# Patient Record
Sex: Male | Born: 1935 | Race: White | Hispanic: No | Marital: Married | Smoking: Former smoker
Health system: Southern US, Community
[De-identification: ages and names within clinical notes are randomized; demographics above are authoritative.]

## PROBLEM LIST (undated history)

## (undated) DIAGNOSIS — K635 Polyp of colon: Secondary | ICD-10-CM

## (undated) DIAGNOSIS — I340 Nonrheumatic mitral (valve) insufficiency: Secondary | ICD-10-CM

## (undated) DIAGNOSIS — Z7901 Long term (current) use of anticoagulants: Secondary | ICD-10-CM

## (undated) DIAGNOSIS — G6181 Chronic inflammatory demyelinating polyneuritis: Secondary | ICD-10-CM

## (undated) DIAGNOSIS — R2681 Unsteadiness on feet: Secondary | ICD-10-CM

## (undated) DIAGNOSIS — L0292 Furuncle, unspecified: Secondary | ICD-10-CM

## (undated) DIAGNOSIS — M8580 Other specified disorders of bone density and structure, unspecified site: Secondary | ICD-10-CM

## (undated) DIAGNOSIS — J309 Allergic rhinitis, unspecified: Secondary | ICD-10-CM

## (undated) DIAGNOSIS — Z8673 Personal history of transient ischemic attack (TIA), and cerebral infarction without residual deficits: Secondary | ICD-10-CM

## (undated) DIAGNOSIS — L0293 Carbuncle, unspecified: Secondary | ICD-10-CM

## (undated) DIAGNOSIS — D229 Melanocytic nevi, unspecified: Secondary | ICD-10-CM

## (undated) DIAGNOSIS — N200 Calculus of kidney: Secondary | ICD-10-CM

## (undated) DIAGNOSIS — I4891 Unspecified atrial fibrillation: Secondary | ICD-10-CM

## (undated) DIAGNOSIS — E785 Hyperlipidemia, unspecified: Secondary | ICD-10-CM

## (undated) HISTORY — DX: Personal history of transient ischemic attack (TIA), and cerebral infarction without residual deficits: Z86.73

## (undated) HISTORY — DX: Hyperlipidemia, unspecified: E78.5

## (undated) HISTORY — DX: Allergic rhinitis, unspecified: J30.9

## (undated) HISTORY — DX: Calculus of kidney: N20.0

## (undated) HISTORY — DX: Furuncle, unspecified: L02.92

## (undated) HISTORY — DX: Melanocytic nevi, unspecified: D22.9

## (undated) HISTORY — PX: APPENDECTOMY: SHX54

## (undated) HISTORY — DX: Chronic inflammatory demyelinating polyneuritis: G61.81

## (undated) HISTORY — DX: Unspecified atrial fibrillation: I48.91

## (undated) HISTORY — DX: Long term (current) use of anticoagulants: Z79.01

## (undated) HISTORY — DX: Carbuncle, unspecified: L02.93

## (undated) HISTORY — DX: Polyp of colon: K63.5

## (undated) HISTORY — DX: Other specified disorders of bone density and structure, unspecified site: M85.80

## (undated) HISTORY — DX: Nonrheumatic mitral (valve) insufficiency: I34.0

## (undated) HISTORY — DX: Other disorders of bilirubin metabolism: E80.6

## (undated) HISTORY — DX: Unsteadiness on feet: R26.81

---

## 2008-08-09 ENCOUNTER — Emergency Department (HOSPITAL_COMMUNITY): Admission: EM | Admit: 2008-08-09 | Discharge: 2008-08-09 | Payer: Self-pay | Admitting: Emergency Medicine

## 2009-02-17 ENCOUNTER — Encounter (INDEPENDENT_AMBULATORY_CARE_PROVIDER_SITE_OTHER): Payer: Self-pay | Admitting: *Deleted

## 2009-02-25 ENCOUNTER — Encounter (INDEPENDENT_AMBULATORY_CARE_PROVIDER_SITE_OTHER): Payer: Self-pay | Admitting: *Deleted

## 2009-03-01 ENCOUNTER — Ambulatory Visit: Payer: Self-pay | Admitting: Gastroenterology

## 2009-04-05 ENCOUNTER — Ambulatory Visit: Payer: Self-pay | Admitting: Gastroenterology

## 2009-09-14 ENCOUNTER — Encounter: Payer: Self-pay | Admitting: Gastroenterology

## 2009-11-22 ENCOUNTER — Telehealth: Payer: Self-pay | Admitting: Gastroenterology

## 2009-12-30 ENCOUNTER — Ambulatory Visit: Payer: Self-pay | Admitting: Gastroenterology

## 2010-04-11 NOTE — Progress Notes (Signed)
Summary: Triage  Phone Note Call from Patient Call back at Home Phone [phone]   Caller: Patient Call For: Dr. Christella Hartigan Reason for Call: Talk to Nurse Summary of Call: pt. is requesting to speak directly to nurse about his COL in [place] Initial call taken by: Karna Christmas,  November 22, 2009 11:17 AM  Follow-up for Phone Call        Message left to call back.   Teryl Lucy RN  November 22, 2009 2:22 PM Pt.rec'd. letter from Dr's. office in [place] where he had colon in 2005 and they are rec. f/u/colon so he is going to mail to Dr.Jacobs the letter for his review and opinion. Follow-up by: Teryl Lucy RN,  November 22, 2009 4:00 PM     Appended Document: Triage recieved a copy of letter mailed to patient from his previous [facility] colonoscopy on July 2011.  In the letter it states that pt had "personal history of adenomatous polyps" and they were recommneding repeat colonoscopy around now (as a recall from his his 12/2003 colonoscopy).  Please call pt, let him know that we usually stop looking for colon polyps around the age 38 (he is 58 now).  If he is concerned, would suggest FOBT testing, two sets and if any are positive then we could go ahead and proceed with clonoscopy.  Appended Document: Triage Pt. ntfd. and will pick up stool cards.

## 2010-04-11 NOTE — Letter (Signed)
Summary: [facility]  [facility]   Imported By: Lester Elmo 12/06/2009 12:18:25  _____________________________________________________________________  External Attachment:    Type:   Image     Comment:   External Document

## 2010-04-11 NOTE — Assessment & Plan Note (Signed)
History of Present Illness Visit Type: Initial Visit Primary GI MD: Rob Bunting MD Primary Provider: Buren Kos, MD Chief Complaint: Colon screening, patient taking coumadin History of Present Illness:     very pleasant 77 year old man who was initially sent for direct colonoscopy for cancer screening.  he did not have a clear family history of colon cancer however his mother did die of a cancer he has never been sure what type it was.   He himself has never had colon polyps or cancer.   He has had colonoscopies every 5 years for 20 years, these were done in [place].  he believes the last colonoscopy was in 2005 and he is very clear that he is never had any abnormalities noted, specifically no polyps.  Dr. Kenton Kingfisher in [place] was GI doc.  No changes in  bowels since last colonoscopy (around 5-6 years ago).  Overall he has gained about 15 pounds.             Current Medications (verified): 1)  Pravachol 20 Mg Tabs (Pravastatin Sodium) .Marland Kitchen.. 1 By Mouth Once Daily 2)  Warfarin Sodium 2 Mg Tabs (Warfarin Sodium) .Marland Kitchen.. 1 By Mouth Once Daily 3)  Toprol Xl 100 Mg Xr24h-Tab (Metoprolol Succinate) .Marland Kitchen.. 1 By Mouth Once Daily 4)  Zyrtec Hives Relief 10 Mg Tabs (Cetirizine Hcl) .... As Needed 5)  Ocuvite  Tabs (Multiple Vitamins-Minerals) .Marland Kitchen.. 1 By Mouth Once Daily 6)  Vitamin D 1000 Unit Tabs (Cholecalciferol) .Marland Kitchen.. 1 By Mouth Once Daily  Allergies (verified): No Known Drug Allergies  Past History:  Past Medical History: Arrhythmia Atrial Fibrillation Hypertension Stroke neuropathy peripheral vascular disease with claudication in legs  Past Surgical History: hand surgery   Family History: mother had cancer, unsure what primary Heart disease  Social History: he is married, he has 2 children, he is retired, he quit smoking, he drinks about 2 alcoholic beverages per day, he does not drink caffeinated beverages  Review of Systems       Pertinent positive and  negative review of systems were noted in the above HPI and GI specific review of systems.  All other review of systems was otherwise negative.   Vital Signs:  Patient profile:   77 year old male Height:      71 inches Weight:      186 pounds Pulse rate:   72 / minute Pulse rhythm:   irregular BP sitting:   122 / 70  (left arm) Cuff size:   regular  Vitals Entered By: June McMurray CMA Duncan Dull) (April 05, 2009 10:31 AM)  Physical Exam  Additional Exam:  Constitutional: generally well appearing elderly man, walks with a cane Psychiatric: alert and oriented times 3 Eyes: extraocular movements intact Mouth: oropharynx moist, no lesions Neck: supple, no lymphadenopathy Cardiovascular: heart regular rate and rythm Lungs: CTA bilaterally Abdomen: soft, non-tender, non-distended, no obvious ascites, no peritoneal signs, normal bowel sounds Extremities: no lower extremity edema bilaterally Skin: no lesions on visible extremities    Impression & Recommendations:  Problem # 1:  routine risk for colon cancer his mother had cancer of unknown primary. He is not sure that this was a colon cancer. He has been getting colonoscopies every 5 years in [place] however he is very clear that he is never had any polyps removed from his colon. He has no changes in his bowel habits. She has probably gained weight in the past year or so.  he had fecal occult blood testing done in  the past year or so by his primary care physician and knows that this was negative for blood  he and I had a long discussion about colon cancer, colon polyp screening. He is at routine risk for colon cancer and usually we recommend colonoscopy every 10 years for that. That would put him out to about 2016. He will be nearly 89 at that point and I do not think it is reasonable to plan for colonoscopy at that point. We generally recommend that screening, surveillance should stop around the age of 58. I did offer to send his stool for  occult blood again and if any of these samples were positive we would go ahead with colonoscopy. He was not interested.  He knows that he should call if he has any changes in his bowels, bleeding, constipation, diarrhea.  Patient Instructions: 1)  Return to see Dr. Christella Hartigan as needed for GI issues. 2)  No plans for colonoscopy at this time. 3)  A copy of this information will be sent to Dr. Clelia Croft. 4)  The medication list was reviewed and reconciled.  All changed / newly prescribed medications were explained.  A complete medication list was provided to the patient / caregiver.

## 2010-05-09 ENCOUNTER — Encounter (INDEPENDENT_AMBULATORY_CARE_PROVIDER_SITE_OTHER): Payer: Medicare Other

## 2010-05-09 DIAGNOSIS — R609 Edema, unspecified: Secondary | ICD-10-CM

## 2010-05-16 NOTE — Procedures (Unsigned)
DUPLEX DEEP VENOUS EXAM - LOWER EXTREMITY  INDICATION:  Right lower extremity edema.  History recent long car ride.  HISTORY:  Edema:  Yes. Trauma/Surgery:  No. Pain:  Yes. PE:  No. Previous DVT:  No. Anticoagulants: Other:  DUPLEX EXAM:               CFV   SFV   PopV  PTV    GSV               R  L  R  L  R  L  R   L  R  L Thrombosis    o  o  o     o     o      o Spontaneous   +  +  +     +     +      + Phasic        +  +  +     +     +      + Augmentation  +  +  +     +     +      + Compressible  +  +  +     +     +      + Competent     +  +  +     +     +      +  Legend:  + - yes  o - no  p - partial  D - decreased  IMPRESSION: 1. No evidence of deep venous thrombosis or superficial venous     thrombus in the right lower extremity. 2. Verbal results given to Tiffany at Dr. Alver Fisher office at 3:05 p.m.     on 05/09/2010.   _____________________________ Larina Earthly, M.D.  LT/MEDQ  D:  05/09/2010  T:  05/09/2010  Job:  [id number]

## 2010-06-20 LAB — DIFFERENTIAL
Basophils Absolute: 0.1 K/uL (ref 0.0–0.1)
Basophils Relative: 1 % (ref 0–1)
Eosinophils Absolute: 0 10*3/uL (ref 0.0–0.7)
Eosinophils Relative: 0 % (ref 0–5)
Lymphocytes Relative: 9 % — ABNORMAL LOW (ref 12–46)
Lymphs Abs: 1.5 10*3/uL (ref 0.7–4.0)
Monocytes Absolute: 2.1 10*3/uL — ABNORMAL HIGH (ref 0.1–1.0)
Monocytes Relative: 12 % (ref 3–12)
Neutro Abs: 13.4 K/uL — ABNORMAL HIGH (ref 1.7–7.7)
Neutrophils Relative %: 78 % — ABNORMAL HIGH (ref 43–77)

## 2010-06-20 LAB — URINALYSIS, ROUTINE W REFLEX MICROSCOPIC
Bilirubin Urine: NEGATIVE
Glucose, UA: NEGATIVE mg/dL
Nitrite: NEGATIVE
Protein, ur: NEGATIVE mg/dL
Specific Gravity, Urine: 1.019 (ref 1.005–1.030)
Urobilinogen, UA: 0.2 mg/dL (ref 0.0–1.0)
pH: 6 (ref 5.0–8.0)

## 2010-06-20 LAB — URINE CULTURE: Colony Count: >=100000

## 2010-06-20 LAB — BASIC METABOLIC PANEL WITH GFR
BUN: 10 mg/dL (ref 6–23)
Calcium: 9.2 mg/dL (ref 8.4–10.5)
Creatinine, Ser: 0.81 mg/dL (ref 0.4–1.5)
GFR calc non Af Amer: >60 mL/min (ref >60)
Glucose, Bld: 100 mg/dL — ABNORMAL HIGH (ref 70–99)
Sodium: 137 meq/L (ref 135–145)

## 2010-06-20 LAB — CBC
HCT: 43.8 % (ref 39.0–52.0)
Hemoglobin: 14.6 g/dL (ref 13.0–17.0)
MCHC: 33.3 g/dL (ref 30.0–36.0)
MCV: 95.4 fL (ref 78.0–100.0)
Platelets: 162 K/uL (ref 150–400)
RBC: 4.59 MIL/uL (ref 4.22–5.81)
RDW: 13.1 % (ref 11.5–15.5)
WBC: 17.1 10*3/uL — ABNORMAL HIGH (ref 4.0–10.5)

## 2010-06-20 LAB — BASIC METABOLIC PANEL
CO2: 25 mEq/L (ref 19–32)
Chloride: 106 mEq/L (ref 96–112)
GFR calc Af Amer: >60 mL/min (ref >60)
Potassium: 4.2 mEq/L (ref 3.5–5.1)

## 2010-06-20 LAB — URINE MICROSCOPIC-ADD ON

## 2011-03-22 DIAGNOSIS — Z7901 Long term (current) use of anticoagulants: Secondary | ICD-10-CM | POA: Diagnosis not present

## 2011-03-22 DIAGNOSIS — I6350 Cerebral infarction due to unspecified occlusion or stenosis of unspecified cerebral artery: Secondary | ICD-10-CM | POA: Diagnosis not present

## 2011-03-22 DIAGNOSIS — I4891 Unspecified atrial fibrillation: Secondary | ICD-10-CM | POA: Diagnosis not present

## 2011-04-25 DIAGNOSIS — Z7901 Long term (current) use of anticoagulants: Secondary | ICD-10-CM | POA: Diagnosis not present

## 2011-04-25 DIAGNOSIS — I4891 Unspecified atrial fibrillation: Secondary | ICD-10-CM | POA: Diagnosis not present

## 2011-05-18 DIAGNOSIS — H3530 Unspecified macular degeneration: Secondary | ICD-10-CM | POA: Diagnosis not present

## 2011-05-18 DIAGNOSIS — Z961 Presence of intraocular lens: Secondary | ICD-10-CM | POA: Diagnosis not present

## 2011-05-18 DIAGNOSIS — H52209 Unspecified astigmatism, unspecified eye: Secondary | ICD-10-CM | POA: Diagnosis not present

## 2011-05-29 DIAGNOSIS — I6350 Cerebral infarction due to unspecified occlusion or stenosis of unspecified cerebral artery: Secondary | ICD-10-CM | POA: Diagnosis not present

## 2011-05-29 DIAGNOSIS — I4891 Unspecified atrial fibrillation: Secondary | ICD-10-CM | POA: Diagnosis not present

## 2011-05-29 DIAGNOSIS — Z7901 Long term (current) use of anticoagulants: Secondary | ICD-10-CM | POA: Diagnosis not present

## 2011-06-13 DIAGNOSIS — I6350 Cerebral infarction due to unspecified occlusion or stenosis of unspecified cerebral artery: Secondary | ICD-10-CM | POA: Diagnosis not present

## 2011-06-13 DIAGNOSIS — Z7901 Long term (current) use of anticoagulants: Secondary | ICD-10-CM | POA: Diagnosis not present

## 2011-06-13 DIAGNOSIS — I4891 Unspecified atrial fibrillation: Secondary | ICD-10-CM | POA: Diagnosis not present

## 2011-07-11 DIAGNOSIS — D68311 Acquired hemophilia: Secondary | ICD-10-CM | POA: Diagnosis not present

## 2011-07-11 DIAGNOSIS — E785 Hyperlipidemia, unspecified: Secondary | ICD-10-CM | POA: Diagnosis not present

## 2011-07-11 DIAGNOSIS — I4891 Unspecified atrial fibrillation: Secondary | ICD-10-CM | POA: Diagnosis not present

## 2011-07-17 DIAGNOSIS — I4891 Unspecified atrial fibrillation: Secondary | ICD-10-CM | POA: Diagnosis not present

## 2011-07-17 DIAGNOSIS — I6350 Cerebral infarction due to unspecified occlusion or stenosis of unspecified cerebral artery: Secondary | ICD-10-CM | POA: Diagnosis not present

## 2011-07-17 DIAGNOSIS — Z7901 Long term (current) use of anticoagulants: Secondary | ICD-10-CM | POA: Diagnosis not present

## 2011-07-27 DIAGNOSIS — B351 Tinea unguium: Secondary | ICD-10-CM | POA: Diagnosis not present

## 2011-07-27 DIAGNOSIS — M79609 Pain in unspecified limb: Secondary | ICD-10-CM | POA: Diagnosis not present

## 2011-08-22 DIAGNOSIS — G6181 Chronic inflammatory demyelinating polyneuritis: Secondary | ICD-10-CM | POA: Diagnosis not present

## 2011-08-22 DIAGNOSIS — R413 Other amnesia: Secondary | ICD-10-CM | POA: Diagnosis not present

## 2011-08-27 DIAGNOSIS — E785 Hyperlipidemia, unspecified: Secondary | ICD-10-CM | POA: Diagnosis not present

## 2011-08-27 DIAGNOSIS — F068 Other specified mental disorders due to known physiological condition: Secondary | ICD-10-CM | POA: Diagnosis not present

## 2011-08-27 DIAGNOSIS — I4891 Unspecified atrial fibrillation: Secondary | ICD-10-CM | POA: Diagnosis not present

## 2011-08-27 DIAGNOSIS — Z7901 Long term (current) use of anticoagulants: Secondary | ICD-10-CM | POA: Diagnosis not present

## 2011-09-27 DIAGNOSIS — Z7901 Long term (current) use of anticoagulants: Secondary | ICD-10-CM | POA: Diagnosis not present

## 2011-09-27 DIAGNOSIS — I4891 Unspecified atrial fibrillation: Secondary | ICD-10-CM | POA: Diagnosis not present

## 2011-09-27 DIAGNOSIS — I6350 Cerebral infarction due to unspecified occlusion or stenosis of unspecified cerebral artery: Secondary | ICD-10-CM | POA: Diagnosis not present

## 2011-10-23 DIAGNOSIS — B351 Tinea unguium: Secondary | ICD-10-CM | POA: Diagnosis not present

## 2011-10-23 DIAGNOSIS — M79609 Pain in unspecified limb: Secondary | ICD-10-CM | POA: Diagnosis not present

## 2011-11-01 DIAGNOSIS — I4891 Unspecified atrial fibrillation: Secondary | ICD-10-CM | POA: Diagnosis not present

## 2011-11-01 DIAGNOSIS — Z7901 Long term (current) use of anticoagulants: Secondary | ICD-10-CM | POA: Diagnosis not present

## 2011-11-16 DIAGNOSIS — H3530 Unspecified macular degeneration: Secondary | ICD-10-CM | POA: Diagnosis not present

## 2011-11-16 DIAGNOSIS — Z961 Presence of intraocular lens: Secondary | ICD-10-CM | POA: Diagnosis not present

## 2011-11-19 DIAGNOSIS — Z23 Encounter for immunization: Secondary | ICD-10-CM | POA: Diagnosis not present

## 2011-11-20 DIAGNOSIS — L219 Seborrheic dermatitis, unspecified: Secondary | ICD-10-CM | POA: Diagnosis not present

## 2011-11-20 DIAGNOSIS — Z85828 Personal history of other malignant neoplasm of skin: Secondary | ICD-10-CM | POA: Diagnosis not present

## 2011-11-20 DIAGNOSIS — L821 Other seborrheic keratosis: Secondary | ICD-10-CM | POA: Diagnosis not present

## 2011-11-20 DIAGNOSIS — L570 Actinic keratosis: Secondary | ICD-10-CM | POA: Diagnosis not present

## 2011-11-20 DIAGNOSIS — B353 Tinea pedis: Secondary | ICD-10-CM | POA: Diagnosis not present

## 2011-11-20 DIAGNOSIS — B351 Tinea unguium: Secondary | ICD-10-CM | POA: Diagnosis not present

## 2011-12-05 DIAGNOSIS — I6350 Cerebral infarction due to unspecified occlusion or stenosis of unspecified cerebral artery: Secondary | ICD-10-CM | POA: Diagnosis not present

## 2011-12-05 DIAGNOSIS — Z7901 Long term (current) use of anticoagulants: Secondary | ICD-10-CM | POA: Diagnosis not present

## 2011-12-05 DIAGNOSIS — I4891 Unspecified atrial fibrillation: Secondary | ICD-10-CM | POA: Diagnosis not present

## 2012-01-04 DIAGNOSIS — E785 Hyperlipidemia, unspecified: Secondary | ICD-10-CM | POA: Diagnosis not present

## 2012-01-04 DIAGNOSIS — I059 Rheumatic mitral valve disease, unspecified: Secondary | ICD-10-CM | POA: Diagnosis not present

## 2012-01-04 DIAGNOSIS — I4891 Unspecified atrial fibrillation: Secondary | ICD-10-CM | POA: Diagnosis not present

## 2012-01-04 DIAGNOSIS — I079 Rheumatic tricuspid valve disease, unspecified: Secondary | ICD-10-CM | POA: Diagnosis not present

## 2012-01-08 DIAGNOSIS — I6350 Cerebral infarction due to unspecified occlusion or stenosis of unspecified cerebral artery: Secondary | ICD-10-CM | POA: Diagnosis not present

## 2012-01-08 DIAGNOSIS — Z7901 Long term (current) use of anticoagulants: Secondary | ICD-10-CM | POA: Diagnosis not present

## 2012-01-08 DIAGNOSIS — I4891 Unspecified atrial fibrillation: Secondary | ICD-10-CM | POA: Diagnosis not present

## 2012-02-05 DIAGNOSIS — M79609 Pain in unspecified limb: Secondary | ICD-10-CM | POA: Diagnosis not present

## 2012-02-05 DIAGNOSIS — B351 Tinea unguium: Secondary | ICD-10-CM | POA: Diagnosis not present

## 2012-02-06 DIAGNOSIS — I4891 Unspecified atrial fibrillation: Secondary | ICD-10-CM | POA: Diagnosis not present

## 2012-02-06 DIAGNOSIS — Z7901 Long term (current) use of anticoagulants: Secondary | ICD-10-CM | POA: Diagnosis not present

## 2012-02-06 DIAGNOSIS — I6350 Cerebral infarction due to unspecified occlusion or stenosis of unspecified cerebral artery: Secondary | ICD-10-CM | POA: Diagnosis not present

## 2012-02-15 DIAGNOSIS — Z7901 Long term (current) use of anticoagulants: Secondary | ICD-10-CM | POA: Diagnosis not present

## 2012-02-15 DIAGNOSIS — F068 Other specified mental disorders due to known physiological condition: Secondary | ICD-10-CM | POA: Diagnosis not present

## 2012-02-15 DIAGNOSIS — M810 Age-related osteoporosis without current pathological fracture: Secondary | ICD-10-CM | POA: Diagnosis not present

## 2012-02-15 DIAGNOSIS — E785 Hyperlipidemia, unspecified: Secondary | ICD-10-CM | POA: Diagnosis not present

## 2012-02-15 DIAGNOSIS — Z125 Encounter for screening for malignant neoplasm of prostate: Secondary | ICD-10-CM | POA: Diagnosis not present

## 2012-02-19 DIAGNOSIS — Z1212 Encounter for screening for malignant neoplasm of rectum: Secondary | ICD-10-CM | POA: Diagnosis not present

## 2012-02-25 DIAGNOSIS — Z7901 Long term (current) use of anticoagulants: Secondary | ICD-10-CM | POA: Diagnosis not present

## 2012-02-25 DIAGNOSIS — I6350 Cerebral infarction due to unspecified occlusion or stenosis of unspecified cerebral artery: Secondary | ICD-10-CM | POA: Diagnosis not present

## 2012-02-25 DIAGNOSIS — Z0000 Encounter for general adult medical examination without abnormal findings: Secondary | ICD-10-CM | POA: Diagnosis not present

## 2012-02-25 DIAGNOSIS — E785 Hyperlipidemia, unspecified: Secondary | ICD-10-CM | POA: Diagnosis not present

## 2012-02-25 DIAGNOSIS — Z125 Encounter for screening for malignant neoplasm of prostate: Secondary | ICD-10-CM | POA: Diagnosis not present

## 2012-02-25 DIAGNOSIS — I080 Rheumatic disorders of both mitral and aortic valves: Secondary | ICD-10-CM | POA: Diagnosis not present

## 2012-02-25 DIAGNOSIS — I4891 Unspecified atrial fibrillation: Secondary | ICD-10-CM | POA: Diagnosis not present

## 2012-03-18 DIAGNOSIS — R413 Other amnesia: Secondary | ICD-10-CM | POA: Diagnosis not present

## 2012-03-25 ENCOUNTER — Encounter: Payer: Self-pay | Admitting: Cardiology

## 2012-03-25 ENCOUNTER — Ambulatory Visit (INDEPENDENT_AMBULATORY_CARE_PROVIDER_SITE_OTHER): Payer: Medicare Other | Admitting: Cardiology

## 2012-03-25 VITALS — BP 120/70 | HR 72 | Ht 71.0 in

## 2012-03-25 DIAGNOSIS — I059 Rheumatic mitral valve disease, unspecified: Secondary | ICD-10-CM

## 2012-03-25 DIAGNOSIS — E785 Hyperlipidemia, unspecified: Secondary | ICD-10-CM | POA: Diagnosis not present

## 2012-03-25 DIAGNOSIS — I4891 Unspecified atrial fibrillation: Secondary | ICD-10-CM | POA: Diagnosis not present

## 2012-03-25 DIAGNOSIS — I340 Nonrheumatic mitral (valve) insufficiency: Secondary | ICD-10-CM

## 2012-03-25 DIAGNOSIS — I341 Nonrheumatic mitral (valve) prolapse: Secondary | ICD-10-CM | POA: Insufficient documentation

## 2012-03-25 NOTE — Patient Instructions (Signed)
Continue your current therapy  I will see you again in 6 months.   

## 2012-03-26 NOTE — Progress Notes (Signed)
Rodney Malone Date of Birth: 1923/02/09 Medical Record #[id number]  History of Present Illness: Rodney Malone is seen at the request of Dr. Clelia Croft for evaluation of mitral insufficiency. 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 He previously received his care by Dr. Lysle Pearl in [place]. He now lives at [facility]. He has a history of chronic atrial fibrillation and has been on Coumadin. He has a prior stroke in 1989. According to the patient his doctor in [place] told him that he had worsening mitral insufficiency based on a recent echocardiogram. Apparently surgery was considered. The patient is asymptomatic from a cardiac standpoint. He is able to walk with a cane and denies any shortness of breath. He has no orthopnea or PND. He has had no edema or chest pain. He has no history of syncope. He does have some gait instability and wears braces on his legs. He tried to walk and troponins today without support of a cane and fell in our lobby. He denies any significant injury.  Current Outpatient Prescriptions on File Prior to Visit  Medication Sig Dispense Refill  . cetirizine (ZYRTEC) 10 MG tablet Take 10 mg by mouth daily.      . colchicine 0.6 MG tablet Take 0.6 mg by mouth every 8 (eight) hours.      . diphenhydramine-acetaminophen (TYLENOL PM) 25-500 MG TABS Take 1 tablet by mouth at bedtime as needed.      . fluticasone (VERAMYST) 27.5 MCG/SPRAY nasal spray Place 2 sprays into the nose daily.      . metoprolol succinate (TOPROL-XL) 100 MG 24 hr tablet Take 100 mg by mouth daily. Take with or immediately following a meal.      . pravastatin (PRAVACHOL) 20 MG tablet Take 20 mg by mouth daily.      Marland Kitchen warfarin (COUMADIN) 2 MG tablet Take 2 mg by mouth daily.        Allergies  Allergen Reactions  . Penicillins     Past Medical History  Diagnosis Date  . Hyperlipidemia   . Atrial fibrillation   . Osteopenia   . H/O: CVA (cerebrovascular accident)     . Unsteady gait   . Allergic rhinitis   . Nephrolithiasis   . Nevus, atypical   . Recurrent boils   . Hyperbilirubinemia   . Colonic polyp   . Mitral regurgitation     Past Surgical History  Procedure Date  . Appendectomy     History  Smoking status  . Former Smoker  Smokeless tobacco  . Not on file    History  Alcohol Use  . Yes    History reviewed. No pertinent family history.  Review of Systems: The review of systems is positive for memory loss. He has chronic pain and muscle wasting related to CIDP. All other systems were reviewed and are negative.  Physical Exam: BP 120/70  Pulse 72  Ht 5\' 11"  (1.803 m) He is a pleasant, elderly white male in no acute distress. HEENT: Normocephalic, atraumatic. Pupils are equal round and reactive to light and accommodation. Extraocular movements are full. Hearing is poor. Oropharynx is clear with good dental repair. Neck is supple without jugular venous distention, bruits, adenopathy, or thyromegaly. Lungs: Clear Cardiovascular: Irregular rate and rhythm. Normal S1 and S2. Grade 2-3/6 holosystolic murmur heard at the left sternal border radiating to the axilla. Abdomen: Soft and nontender. No hepatosplenomegaly or masses. No bruits. Extremities: Dependent cyanosis in both feet. No edema.  Patient has significant muscle atrophy in both legs with weakness. Skin: Warm and dry. Neuro: Patient has short-term memory loss as evidenced by him repeating the same story 4 times during his visit. He is alert and oriented x3. LABORATORY DATA: ECG demonstrates atrial fibrillation with a controlled ventricular response of 76 beats per minute. His ECG is otherwise normal. Lab work dated June 2013 showed a normal CBC. Total cholesterol 110, triglycerides 107, HDL 37, LDL 52. Total bilirubin was 1.7 with a direct bilirubin of 0.7. Other liver function studies were normal. BUN 12, creatinine 0.8. More recent lab work dated 02/15/2012 showed a normal  chemistry panel and CBC. Lipids were stable. TSH was 3.18. PSA 1.67. Urinalysis was negative.  Assessment / Plan: 1. Mitral insufficiency. He does have a mitral valve murmur on exam. We will obtain a copy of his recent echocardiogram. Even if he has severe mitral insufficiency I would argue against mitral valve repair given his advanced age and multiple comorbidities. He is asymptomatic and has no evidence of congestive heart failure on exam. If his LV function is preserved his long-term prognosis is probably not significantly impacted by his mitral insufficiency. I'll followup again in 6 months.  2. Atrial fibrillation. Rate is well controlled and he is on chronic anticoagulation.  3. CIDP.  4. Dementia.  5. Remote CVA.  6. Hyperlipidemia, well controlled on pravastatin.

## 2012-04-03 DIAGNOSIS — I4891 Unspecified atrial fibrillation: Secondary | ICD-10-CM | POA: Diagnosis not present

## 2012-04-03 DIAGNOSIS — Z7901 Long term (current) use of anticoagulants: Secondary | ICD-10-CM | POA: Diagnosis not present

## 2012-05-09 DIAGNOSIS — M79609 Pain in unspecified limb: Secondary | ICD-10-CM | POA: Diagnosis not present

## 2012-05-09 DIAGNOSIS — B351 Tinea unguium: Secondary | ICD-10-CM | POA: Diagnosis not present

## 2012-05-13 DIAGNOSIS — Z7901 Long term (current) use of anticoagulants: Secondary | ICD-10-CM | POA: Diagnosis not present

## 2012-05-13 DIAGNOSIS — I4891 Unspecified atrial fibrillation: Secondary | ICD-10-CM | POA: Diagnosis not present

## 2012-06-17 DIAGNOSIS — I4891 Unspecified atrial fibrillation: Secondary | ICD-10-CM | POA: Diagnosis not present

## 2012-06-17 DIAGNOSIS — Z7901 Long term (current) use of anticoagulants: Secondary | ICD-10-CM | POA: Diagnosis not present

## 2012-07-01 ENCOUNTER — Other Ambulatory Visit (HOSPITAL_COMMUNITY): Payer: Self-pay | Admitting: Internal Medicine

## 2012-07-01 ENCOUNTER — Ambulatory Visit (HOSPITAL_COMMUNITY)
Admission: RE | Admit: 2012-07-01 | Discharge: 2012-07-01 | Disposition: A | Discharge status: Home / Self Care | Payer: Medicare Other | Source: Ambulatory Visit | Attending: Internal Medicine | Admitting: Internal Medicine

## 2012-07-01 DIAGNOSIS — R51 Headache: Secondary | ICD-10-CM | POA: Insufficient documentation

## 2012-07-01 DIAGNOSIS — Z8673 Personal history of transient ischemic attack (TIA), and cerebral infarction without residual deficits: Secondary | ICD-10-CM | POA: Insufficient documentation

## 2012-07-01 DIAGNOSIS — R519 Headache, unspecified: Secondary | ICD-10-CM

## 2012-07-01 DIAGNOSIS — Z7901 Long term (current) use of anticoagulants: Secondary | ICD-10-CM | POA: Diagnosis not present

## 2012-07-01 DIAGNOSIS — G319 Degenerative disease of nervous system, unspecified: Secondary | ICD-10-CM | POA: Diagnosis not present

## 2012-07-01 DIAGNOSIS — J309 Allergic rhinitis, unspecified: Secondary | ICD-10-CM | POA: Diagnosis not present

## 2012-07-01 DIAGNOSIS — Z6825 Body mass index (BMI) 25.0-25.9, adult: Secondary | ICD-10-CM | POA: Diagnosis not present

## 2012-07-04 DIAGNOSIS — I4891 Unspecified atrial fibrillation: Secondary | ICD-10-CM | POA: Diagnosis not present

## 2012-07-04 DIAGNOSIS — E785 Hyperlipidemia, unspecified: Secondary | ICD-10-CM | POA: Diagnosis not present

## 2012-07-16 DIAGNOSIS — I4891 Unspecified atrial fibrillation: Secondary | ICD-10-CM | POA: Diagnosis not present

## 2012-07-16 DIAGNOSIS — I6350 Cerebral infarction due to unspecified occlusion or stenosis of unspecified cerebral artery: Secondary | ICD-10-CM | POA: Diagnosis not present

## 2012-07-16 DIAGNOSIS — Z7901 Long term (current) use of anticoagulants: Secondary | ICD-10-CM | POA: Diagnosis not present

## 2012-07-24 DIAGNOSIS — Z961 Presence of intraocular lens: Secondary | ICD-10-CM | POA: Diagnosis not present

## 2012-07-24 DIAGNOSIS — H52209 Unspecified astigmatism, unspecified eye: Secondary | ICD-10-CM | POA: Diagnosis not present

## 2012-07-24 DIAGNOSIS — H3530 Unspecified macular degeneration: Secondary | ICD-10-CM | POA: Diagnosis not present

## 2012-08-15 DIAGNOSIS — IMO0002 Reserved for concepts with insufficient information to code with codable children: Secondary | ICD-10-CM | POA: Diagnosis not present

## 2012-08-15 DIAGNOSIS — Z6825 Body mass index (BMI) 25.0-25.9, adult: Secondary | ICD-10-CM | POA: Diagnosis not present

## 2012-08-15 DIAGNOSIS — R269 Unspecified abnormalities of gait and mobility: Secondary | ICD-10-CM | POA: Diagnosis not present

## 2012-08-15 DIAGNOSIS — G6181 Chronic inflammatory demyelinating polyneuritis: Secondary | ICD-10-CM | POA: Diagnosis not present

## 2012-08-18 DIAGNOSIS — M79609 Pain in unspecified limb: Secondary | ICD-10-CM | POA: Diagnosis not present

## 2012-08-18 DIAGNOSIS — B351 Tinea unguium: Secondary | ICD-10-CM | POA: Diagnosis not present

## 2012-08-26 DIAGNOSIS — I4891 Unspecified atrial fibrillation: Secondary | ICD-10-CM | POA: Diagnosis not present

## 2012-08-26 DIAGNOSIS — Z7901 Long term (current) use of anticoagulants: Secondary | ICD-10-CM | POA: Diagnosis not present

## 2012-08-26 DIAGNOSIS — M899 Disorder of bone, unspecified: Secondary | ICD-10-CM | POA: Diagnosis not present

## 2012-08-26 DIAGNOSIS — R269 Unspecified abnormalities of gait and mobility: Secondary | ICD-10-CM | POA: Diagnosis not present

## 2012-08-26 DIAGNOSIS — E785 Hyperlipidemia, unspecified: Secondary | ICD-10-CM | POA: Diagnosis not present

## 2012-08-26 DIAGNOSIS — Z6825 Body mass index (BMI) 25.0-25.9, adult: Secondary | ICD-10-CM | POA: Diagnosis not present

## 2012-08-26 DIAGNOSIS — I080 Rheumatic disorders of both mitral and aortic valves: Secondary | ICD-10-CM | POA: Diagnosis not present

## 2012-08-26 DIAGNOSIS — G6181 Chronic inflammatory demyelinating polyneuritis: Secondary | ICD-10-CM | POA: Diagnosis not present

## 2012-08-26 DIAGNOSIS — M949 Disorder of cartilage, unspecified: Secondary | ICD-10-CM | POA: Diagnosis not present

## 2012-09-30 ENCOUNTER — Encounter: Payer: Self-pay | Admitting: Cardiology

## 2012-09-30 ENCOUNTER — Ambulatory Visit (INDEPENDENT_AMBULATORY_CARE_PROVIDER_SITE_OTHER): Payer: Medicare Other | Admitting: Cardiology

## 2012-09-30 VITALS — BP 140/76 | HR 67 | Ht 71.0 in | Wt 183.8 lb

## 2012-09-30 DIAGNOSIS — I059 Rheumatic mitral valve disease, unspecified: Secondary | ICD-10-CM | POA: Diagnosis not present

## 2012-09-30 DIAGNOSIS — I340 Nonrheumatic mitral (valve) insufficiency: Secondary | ICD-10-CM

## 2012-09-30 DIAGNOSIS — E785 Hyperlipidemia, unspecified: Secondary | ICD-10-CM

## 2012-09-30 DIAGNOSIS — I4891 Unspecified atrial fibrillation: Secondary | ICD-10-CM | POA: Diagnosis not present

## 2012-09-30 NOTE — Progress Notes (Signed)
Rodney Malone Date of Birth: 02/11/1923 Medical Record #[id number]  History of Present Illness: Mr. Rodney Malone is seen for followup of mitral insufficiency. 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 He previously received his care by Dr. Lysle Pearl in [place]. He has a history of chronic atrial fibrillation and has been on Coumadin. He has a remote history of stroke. According to his physician in [place] he had worsening mitral insufficiency and was actually considered for surgery. Despite previous request I have not received his old records. He is limited predominantly by his CIDP. His walking is very limited. He does have some shortness of breath but this doesn't really limit him. His weight has been stable. He's had no edema. He denies any dizziness, palpitations, or syncope. He has no orthopnea or PND. He denies any chest pain.  Current Outpatient Prescriptions on File Prior to Visit  Medication Sig Dispense Refill  . alendronate (FOSAMAX) 70 MG tablet Take 70 mg by mouth every 7 (seven) days. Take with a full glass of water on Malone empty stomach.      . cetirizine (ZYRTEC) 10 MG tablet Take 10 mg by mouth as needed.       . diphenhydramine-acetaminophen (TYLENOL PM) 25-500 MG TABS Take 1 tablet by mouth at bedtime as needed.      Marland Kitchen ketoconazole (NIZORAL) 2 % cream Apply topically as needed.      . metoprolol succinate (TOPROL-XL) 100 MG 24 hr tablet Take 100 mg by mouth daily. Take with or immediately following a meal.      . pravastatin (PRAVACHOL) 20 MG tablet Take 20 mg by mouth daily.      Marland Kitchen warfarin (COUMADIN) 2 MG tablet Take 2 mg by mouth daily.       No current facility-administered medications on file prior to visit.    Allergies  Allergen Reactions  . Penicillins     Past Medical History  Diagnosis Date  . Hyperlipidemia   . Atrial fibrillation   . Osteopenia   . H/O: CVA (cerebrovascular accident)   . Unsteady gait   . Allergic rhinitis   .  Nephrolithiasis   . Nevus, atypical   . Recurrent boils   . Hyperbilirubinemia   . Colonic polyp   . Mitral regurgitation     Past Surgical History  Procedure Laterality Date  . Appendectomy      History  Smoking status  . Former Smoker  Smokeless tobacco  . Not on file    History  Alcohol Use  . Yes    History reviewed. No pertinent family history.  Review of Systems: The review of systems is positive for memory loss. He has chronic pain and muscle wasting related to CIDP. All other systems were reviewed and are negative.  Physical Exam: BP 140/76  Pulse 67  Ht 5\' 11"  (1.803 m)  Wt 183 lb 12.8 oz (83.371 kg)  BMI 25.65 kg/m2  SpO2 96% He is a pleasant, elderly white male in no acute distress. HEENT: Normal. Oropharynx is clear with good dental repair. Neck is supple without jugular venous distention, bruits, adenopathy, or thyromegaly. Lungs: Clear Cardiovascular: Irregular rate and rhythm. Normal S1 and S2. Grade 2-3/6 holosystolic murmur heard at the left sternal border radiating to the axilla. Abdomen: Soft and nontender. No hepatosplenomegaly or masses. No bruits. Extremities: Dependent cyanosis in both feet. No edema. Patient has significant muscle atrophy in both legs with weakness. Skin: Warm and dry. Neuro: Patient has  short-term memory loss. He is alert and oriented x3.  LABORATORY DATA:   Assessment / Plan: 1. Mitral insufficiency. He does have a mitral valve murmur on exam. I will try again to get his records from [place] to review. I recommended a repeat echocardiogram here. Even if he has severe mitral insufficiency I would argue against mitral valve repair given his advanced age and multiple comorbidities. He is asymptomatic and has no evidence of congestive heart failure on exam. If his LV function is preserved his long-term prognosis is probably not significantly impacted by his mitral insufficiency. I'll make further recommendations once we have  reviewed his prior records and his upcoming echocardiogram.  2. Atrial fibrillation. Rate is well controlled and he is on chronic anticoagulation.  3. CIDP.  4. Dementia.  5. Remote CVA.  6. Hyperlipidemia, well controlled on pravastatin.

## 2012-09-30 NOTE — Patient Instructions (Signed)
We will get your records from Dr. Gearldine Bienenstock  We will update your Echocardiogram

## 2012-10-01 ENCOUNTER — Telehealth: Payer: Self-pay | Admitting: Cardiology

## 2012-10-01 NOTE — Telephone Encounter (Signed)
RO faxed to [facility] MR Dept [phone] 10/01/12/KM

## 2012-10-01 NOTE — Telephone Encounter (Signed)
Records Rec From [facility], gave to [facility]  10/01/12/km

## 2012-10-07 ENCOUNTER — Other Ambulatory Visit: Payer: Self-pay

## 2012-10-07 ENCOUNTER — Ambulatory Visit (HOSPITAL_COMMUNITY): Payer: Medicare Other | Attending: Cardiology | Admitting: Radiology

## 2012-10-07 DIAGNOSIS — I059 Rheumatic mitral valve disease, unspecified: Secondary | ICD-10-CM | POA: Diagnosis not present

## 2012-10-07 DIAGNOSIS — R011 Cardiac murmur, unspecified: Secondary | ICD-10-CM | POA: Insufficient documentation

## 2012-10-07 DIAGNOSIS — I340 Nonrheumatic mitral (valve) insufficiency: Secondary | ICD-10-CM

## 2012-10-07 DIAGNOSIS — I4891 Unspecified atrial fibrillation: Secondary | ICD-10-CM | POA: Diagnosis not present

## 2012-10-07 DIAGNOSIS — E785 Hyperlipidemia, unspecified: Secondary | ICD-10-CM | POA: Diagnosis not present

## 2012-10-07 NOTE — Progress Notes (Signed)
Echocardiogram performed.  

## 2012-10-09 DIAGNOSIS — I4891 Unspecified atrial fibrillation: Secondary | ICD-10-CM | POA: Diagnosis not present

## 2012-10-09 DIAGNOSIS — Z7901 Long term (current) use of anticoagulants: Secondary | ICD-10-CM | POA: Diagnosis not present

## 2012-11-08 DIAGNOSIS — Z23 Encounter for immunization: Secondary | ICD-10-CM | POA: Diagnosis not present

## 2012-11-13 DIAGNOSIS — I4891 Unspecified atrial fibrillation: Secondary | ICD-10-CM | POA: Diagnosis not present

## 2012-11-13 DIAGNOSIS — Z7901 Long term (current) use of anticoagulants: Secondary | ICD-10-CM | POA: Diagnosis not present

## 2012-11-17 DIAGNOSIS — B351 Tinea unguium: Secondary | ICD-10-CM | POA: Diagnosis not present

## 2012-11-17 DIAGNOSIS — M79609 Pain in unspecified limb: Secondary | ICD-10-CM | POA: Diagnosis not present

## 2012-11-18 DIAGNOSIS — B353 Tinea pedis: Secondary | ICD-10-CM | POA: Diagnosis not present

## 2012-11-18 DIAGNOSIS — L821 Other seborrheic keratosis: Secondary | ICD-10-CM | POA: Diagnosis not present

## 2012-11-18 DIAGNOSIS — L570 Actinic keratosis: Secondary | ICD-10-CM | POA: Diagnosis not present

## 2012-11-18 DIAGNOSIS — L219 Seborrheic dermatitis, unspecified: Secondary | ICD-10-CM | POA: Diagnosis not present

## 2012-11-18 DIAGNOSIS — Z85828 Personal history of other malignant neoplasm of skin: Secondary | ICD-10-CM | POA: Diagnosis not present

## 2012-12-18 DIAGNOSIS — Z7901 Long term (current) use of anticoagulants: Secondary | ICD-10-CM | POA: Diagnosis not present

## 2012-12-18 DIAGNOSIS — I4891 Unspecified atrial fibrillation: Secondary | ICD-10-CM | POA: Diagnosis not present

## 2013-01-20 DIAGNOSIS — Z7901 Long term (current) use of anticoagulants: Secondary | ICD-10-CM | POA: Diagnosis not present

## 2013-01-20 DIAGNOSIS — I4891 Unspecified atrial fibrillation: Secondary | ICD-10-CM | POA: Diagnosis not present

## 2013-01-20 DIAGNOSIS — I6350 Cerebral infarction due to unspecified occlusion or stenosis of unspecified cerebral artery: Secondary | ICD-10-CM | POA: Diagnosis not present

## 2013-02-06 DIAGNOSIS — Z125 Encounter for screening for malignant neoplasm of prostate: Secondary | ICD-10-CM | POA: Diagnosis not present

## 2013-02-06 DIAGNOSIS — E785 Hyperlipidemia, unspecified: Secondary | ICD-10-CM | POA: Diagnosis not present

## 2013-02-06 DIAGNOSIS — M810 Age-related osteoporosis without current pathological fracture: Secondary | ICD-10-CM | POA: Diagnosis not present

## 2013-02-06 DIAGNOSIS — Z79899 Other long term (current) drug therapy: Secondary | ICD-10-CM | POA: Diagnosis not present

## 2013-02-11 DIAGNOSIS — G6181 Chronic inflammatory demyelinating polyneuritis: Secondary | ICD-10-CM | POA: Diagnosis not present

## 2013-02-11 DIAGNOSIS — F068 Other specified mental disorders due to known physiological condition: Secondary | ICD-10-CM | POA: Diagnosis not present

## 2013-02-11 DIAGNOSIS — Z1331 Encounter for screening for depression: Secondary | ICD-10-CM | POA: Diagnosis not present

## 2013-02-11 DIAGNOSIS — Z23 Encounter for immunization: Secondary | ICD-10-CM | POA: Diagnosis not present

## 2013-02-11 DIAGNOSIS — Z0000 Encounter for general adult medical examination without abnormal findings: Secondary | ICD-10-CM | POA: Diagnosis not present

## 2013-02-11 DIAGNOSIS — Z7901 Long term (current) use of anticoagulants: Secondary | ICD-10-CM | POA: Diagnosis not present

## 2013-02-11 DIAGNOSIS — M899 Disorder of bone, unspecified: Secondary | ICD-10-CM | POA: Diagnosis not present

## 2013-02-11 DIAGNOSIS — I4891 Unspecified atrial fibrillation: Secondary | ICD-10-CM | POA: Diagnosis not present

## 2013-02-11 DIAGNOSIS — I080 Rheumatic disorders of both mitral and aortic valves: Secondary | ICD-10-CM | POA: Diagnosis not present

## 2013-02-11 DIAGNOSIS — E785 Hyperlipidemia, unspecified: Secondary | ICD-10-CM | POA: Diagnosis not present

## 2013-02-12 DIAGNOSIS — Z1212 Encounter for screening for malignant neoplasm of rectum: Secondary | ICD-10-CM | POA: Diagnosis not present

## 2013-02-18 ENCOUNTER — Encounter: Payer: Self-pay | Admitting: Podiatry

## 2013-02-18 ENCOUNTER — Ambulatory Visit (INDEPENDENT_AMBULATORY_CARE_PROVIDER_SITE_OTHER): Payer: Private Health Insurance - Indemnity | Admitting: Podiatry

## 2013-02-18 VITALS — BP 144/79 | HR 85 | Resp 24 | Ht 71.0 in | Wt 180.0 lb

## 2013-02-18 DIAGNOSIS — M79609 Pain in unspecified limb: Secondary | ICD-10-CM

## 2013-02-18 DIAGNOSIS — B351 Tinea unguium: Secondary | ICD-10-CM

## 2013-02-18 NOTE — Progress Notes (Signed)
Patient ID: Rodney Malone, male   DOB: 01/03/1923, 79 y.o.   MRN: [id number]  Subjective: This orientated x77 79 year old white male presents for ongoing debridement of painful mycotic toenails an approximately three-month intervals. His been a patient in our practice since 2013.  Objective: Feet are cold, have a rubor color bilaterally. All 10 toenails are yellow, brittle, hypertrophic, incurvated and tender to palpation.  Assessment: Symptomatic onychomycoses x10  Plan: All 10 toenails are debrided back without any bleeding. Reappoint at three-month intervals.

## 2013-03-16 DIAGNOSIS — Z7901 Long term (current) use of anticoagulants: Secondary | ICD-10-CM | POA: Diagnosis not present

## 2013-03-16 DIAGNOSIS — IMO0002 Reserved for concepts with insufficient information to code with codable children: Secondary | ICD-10-CM | POA: Diagnosis not present

## 2013-03-16 DIAGNOSIS — I4891 Unspecified atrial fibrillation: Secondary | ICD-10-CM | POA: Diagnosis not present

## 2013-03-16 DIAGNOSIS — Z9181 History of falling: Secondary | ICD-10-CM | POA: Diagnosis not present

## 2013-03-16 DIAGNOSIS — M47817 Spondylosis without myelopathy or radiculopathy, lumbosacral region: Secondary | ICD-10-CM | POA: Diagnosis not present

## 2013-03-16 DIAGNOSIS — IMO0001 Reserved for inherently not codable concepts without codable children: Secondary | ICD-10-CM | POA: Diagnosis not present

## 2013-03-16 DIAGNOSIS — M25559 Pain in unspecified hip: Secondary | ICD-10-CM | POA: Diagnosis not present

## 2013-03-16 DIAGNOSIS — S79919A Unspecified injury of unspecified hip, initial encounter: Secondary | ICD-10-CM | POA: Diagnosis not present

## 2013-03-16 DIAGNOSIS — M1610 Unilateral primary osteoarthritis, unspecified hip: Secondary | ICD-10-CM | POA: Diagnosis not present

## 2013-03-26 DIAGNOSIS — Z7901 Long term (current) use of anticoagulants: Secondary | ICD-10-CM | POA: Diagnosis not present

## 2013-03-26 DIAGNOSIS — I4891 Unspecified atrial fibrillation: Secondary | ICD-10-CM | POA: Diagnosis not present

## 2013-03-30 ENCOUNTER — Ambulatory Visit (INDEPENDENT_AMBULATORY_CARE_PROVIDER_SITE_OTHER): Payer: Medicare Other | Admitting: Cardiology

## 2013-03-30 ENCOUNTER — Encounter: Payer: Self-pay | Admitting: Cardiology

## 2013-03-30 VITALS — BP 138/81 | HR 65 | Ht 71.0 in | Wt 183.0 lb

## 2013-03-30 DIAGNOSIS — E785 Hyperlipidemia, unspecified: Secondary | ICD-10-CM

## 2013-03-30 DIAGNOSIS — I059 Rheumatic mitral valve disease, unspecified: Secondary | ICD-10-CM | POA: Diagnosis not present

## 2013-03-30 DIAGNOSIS — I340 Nonrheumatic mitral (valve) insufficiency: Secondary | ICD-10-CM

## 2013-03-30 DIAGNOSIS — I4891 Unspecified atrial fibrillation: Secondary | ICD-10-CM | POA: Diagnosis not present

## 2013-03-30 NOTE — Patient Instructions (Signed)
Continue your current therapy  We will check an Echo and see you again in 6 months.

## 2013-03-30 NOTE — Progress Notes (Signed)
Rodney Malone Date of Birth: August 20, 1922 Medical Record #[id number]  History of Present Illness: Mr. Rodney Malone is seen for followup of mitral insufficiency. He also has CIDP. He has a history of chronic atrial fibrillation and has been on Coumadin. He has a remote history of stroke. According to his physician in [place] he had worsening mitral insufficiency and was actually considered for surgery.  He is limited predominantly by his CIDP. His walking is very limited. He denies any SOB or chest pain. His weight has been stable. He's had no edema. He denies any dizziness, palpitations, or syncope. He has no orthopnea or PND. He did have a fall one month ago without fracture but still with significant jarring of his lower back and hip.  Current Outpatient Prescriptions on File Prior to Visit  Medication Sig Dispense Refill  . alendronate (FOSAMAX) 70 MG tablet Take 70 mg by mouth every 7 (seven) days. Take with a full glass of water on an empty stomach.      . cetirizine (ZYRTEC) 10 MG tablet Take 10 mg by mouth as needed.       . diphenhydramine-acetaminophen (TYLENOL PM) 25-500 MG TABS Take 1 tablet by mouth at bedtime as needed.      Marland Kitchen ketoconazole (NIZORAL) 2 % cream Apply topically as needed.      . metoprolol succinate (TOPROL-XL) 100 MG 24 hr tablet Take 100 mg by mouth daily. Take with or immediately following a meal.      . pravastatin (PRAVACHOL) 20 MG tablet Take 20 mg by mouth daily.      Marland Kitchen warfarin (COUMADIN) 2 MG tablet Take 2 mg by mouth daily.       No current facility-administered medications on file prior to visit.    Allergies  Allergen Reactions  . Penicillins     Past Medical History  Diagnosis Date  . Hyperlipidemia   . Atrial fibrillation   . Osteopenia   . H/O: CVA (cerebrovascular accident)   . Unsteady gait   . Allergic rhinitis   . Nephrolithiasis   . Nevus, atypical   . Recurrent boils   . Hyperbilirubinemia   . Colonic polyp   . Mitral regurgitation      Past Surgical History  Procedure Laterality Date  . Appendectomy      History  Smoking status  . Former Smoker  Smokeless tobacco  . Never Used    History  Alcohol Use  . Yes    Comment: usually have a couple of drinks a day    History reviewed. No pertinent family history.  Review of Systems: The review of systems is positive for memory loss. He has chronic pain and muscle wasting related to CIDP. All other systems were reviewed and are negative.  Physical Exam: BP 138/81  Pulse 65  Ht 5\' 11"  (1.803 m)  Wt 183 lb (83.008 kg)  BMI 25.53 kg/m2 He is a pleasant, elderly white male in no acute distress. HEENT: Normal. Oropharynx is clear with good dental repair. Neck is supple without jugular venous distention, bruits, adenopathy, or thyromegaly. Lungs: Clear Cardiovascular: Irregular rate and rhythm. Normal S1 and S2. Grade 3-2/4 holosystolic murmur heard at the left sternal border radiating to the axilla. Abdomen: Soft and nontender. No hepatosplenomegaly or masses. No bruits. Extremities: he has braces on both legs. No edema. Patient has significant muscle atrophy in both legs with weakness. Skin: Warm and dry. Neuro: Patient has short-term memory loss. He is alert and oriented x3. He  walks with a walker.  LABORATORY DATA: Recent INR 2.8.  Echo:10/07/12 Study Conclusions  - Left ventricle: The cavity size was mildly dilated. Wall thickness was normal. Systolic function was normal. The estimated ejection fraction was in the range of 55% to 60%. Wall motion was normal; there were no regional wall motion abnormalities. - Mitral valve: Flail motion. Moderate to severe regurgitation directed eccentrically. - Left atrium: The atrium was moderately dilated. - Right ventricle: The cavity size was mildly dilated. - Right atrium: The atrium was moderately dilated. - Pulmonary arteries: Systolic pressure was mildly increased. PA peak pressure: 2mm Hg  (S). Impressions:  - Oscillating density associtated with mitral valve (possible rupture chord); there appears to be resultant prolapse/partially flail segmentof posterior MV leaflet; MR difficult to quantiate due to eccentric, anteriorly directed jet but probably severe; suggest TEE to better asess.    Assessment / Plan: 1. 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   I would argue against mitral valve repair given his advanced age and multiple comorbidities. He is asymptomatic and has no evidence of congestive heart failure on exam. His LV function is preserved so his long-term prognosis is probably not significantly impacted by his mitral insufficiency. We will continue his medical therapy and repeat an Echo in 6 months. If he should develop symptoms or decline in LV function I would consider a Mitral clip procedure.  2. Atrial fibrillation. Rate is well controlled and he is on chronic anticoagulation.  3. CIDP.  4. Dementia.  5. Remote CVA.  6. Hyperlipidemia, well controlled on pravastatin.

## 2013-04-15 DIAGNOSIS — Z961 Presence of intraocular lens: Secondary | ICD-10-CM | POA: Diagnosis not present

## 2013-04-15 DIAGNOSIS — H2640 Unspecified secondary cataract: Secondary | ICD-10-CM | POA: Diagnosis not present

## 2013-04-15 DIAGNOSIS — H3530 Unspecified macular degeneration: Secondary | ICD-10-CM | POA: Diagnosis not present

## 2013-05-11 DIAGNOSIS — Z7901 Long term (current) use of anticoagulants: Secondary | ICD-10-CM | POA: Diagnosis not present

## 2013-05-11 DIAGNOSIS — I4891 Unspecified atrial fibrillation: Secondary | ICD-10-CM | POA: Diagnosis not present

## 2013-05-18 ENCOUNTER — Ambulatory Visit (INDEPENDENT_AMBULATORY_CARE_PROVIDER_SITE_OTHER): Payer: Medicare Other | Admitting: Podiatry

## 2013-05-18 ENCOUNTER — Encounter: Payer: Self-pay | Admitting: Podiatry

## 2013-05-18 VITALS — BP 136/70 | HR 73 | Resp 16

## 2013-05-18 DIAGNOSIS — M79609 Pain in unspecified limb: Secondary | ICD-10-CM

## 2013-05-18 DIAGNOSIS — B351 Tinea unguium: Secondary | ICD-10-CM | POA: Diagnosis not present

## 2013-05-19 NOTE — Progress Notes (Signed)
Patient ID: Rodney Malone, male   DOB: [birth year], 80 y.o.   MRN: [id number]  Subjective: This patient presents for ongoing debridement of painful mycotic toenails.  Objective: Bilateral feet have rubor, and are cold to touch. The toenails are brittle, incurvated discolored and hypertrophic and tender to palpation.  Symptomatic onychomycoses x10  Plan: Nails x10 are debrided back down to bleeding. Reappoint at three-month intervals

## 2013-06-16 DIAGNOSIS — I4891 Unspecified atrial fibrillation: Secondary | ICD-10-CM | POA: Diagnosis not present

## 2013-06-16 DIAGNOSIS — Z7901 Long term (current) use of anticoagulants: Secondary | ICD-10-CM | POA: Diagnosis not present

## 2013-06-29 ENCOUNTER — Telehealth: Payer: Self-pay | Admitting: Cardiology

## 2013-07-14 ENCOUNTER — Non-Acute Institutional Stay (SKILLED_NURSING_FACILITY): Payer: Medicare Other | Admitting: Geriatric Medicine

## 2013-07-14 ENCOUNTER — Encounter: Payer: Self-pay | Admitting: Geriatric Medicine

## 2013-07-14 ENCOUNTER — Emergency Department (HOSPITAL_COMMUNITY)
Admission: EM | Admit: 2013-07-14 | Discharge: 2013-07-14 | Disposition: A | Discharge status: Home / Self Care | Payer: Medicare Other | Attending: Emergency Medicine | Admitting: Emergency Medicine

## 2013-07-14 ENCOUNTER — Encounter (HOSPITAL_COMMUNITY): Payer: Self-pay | Admitting: Emergency Medicine

## 2013-07-14 ENCOUNTER — Emergency Department (HOSPITAL_COMMUNITY): Payer: Medicare Other

## 2013-07-14 DIAGNOSIS — Z79899 Other long term (current) drug therapy: Secondary | ICD-10-CM | POA: Diagnosis not present

## 2013-07-14 DIAGNOSIS — R51 Headache: Secondary | ICD-10-CM | POA: Diagnosis not present

## 2013-07-14 DIAGNOSIS — Z7901 Long term (current) use of anticoagulants: Secondary | ICD-10-CM | POA: Diagnosis not present

## 2013-07-14 DIAGNOSIS — S199XXA Unspecified injury of neck, initial encounter: Secondary | ICD-10-CM | POA: Diagnosis not present

## 2013-07-14 DIAGNOSIS — R011 Cardiac murmur, unspecified: Secondary | ICD-10-CM | POA: Insufficient documentation

## 2013-07-14 DIAGNOSIS — Z23 Encounter for immunization: Secondary | ICD-10-CM | POA: Insufficient documentation

## 2013-07-14 DIAGNOSIS — M949 Disorder of cartilage, unspecified: Secondary | ICD-10-CM | POA: Diagnosis not present

## 2013-07-14 DIAGNOSIS — Z87442 Personal history of urinary calculi: Secondary | ICD-10-CM | POA: Diagnosis not present

## 2013-07-14 DIAGNOSIS — IMO0002 Reserved for concepts with insufficient information to code with codable children: Secondary | ICD-10-CM | POA: Insufficient documentation

## 2013-07-14 DIAGNOSIS — W1809XA Striking against other object with subsequent fall, initial encounter: Secondary | ICD-10-CM | POA: Insufficient documentation

## 2013-07-14 DIAGNOSIS — W19XXXA Unspecified fall, initial encounter: Secondary | ICD-10-CM

## 2013-07-14 DIAGNOSIS — M899 Disorder of bone, unspecified: Secondary | ICD-10-CM | POA: Diagnosis not present

## 2013-07-14 DIAGNOSIS — I4891 Unspecified atrial fibrillation: Secondary | ICD-10-CM | POA: Insufficient documentation

## 2013-07-14 DIAGNOSIS — I059 Rheumatic mitral valve disease, unspecified: Secondary | ICD-10-CM | POA: Diagnosis not present

## 2013-07-14 DIAGNOSIS — G6181 Chronic inflammatory demyelinating polyneuritis: Secondary | ICD-10-CM

## 2013-07-14 DIAGNOSIS — Z860100 Personal history of colon polyps, unspecified: Secondary | ICD-10-CM | POA: Insufficient documentation

## 2013-07-14 DIAGNOSIS — E785 Hyperlipidemia, unspecified: Secondary | ICD-10-CM | POA: Diagnosis not present

## 2013-07-14 DIAGNOSIS — Z872 Personal history of diseases of the skin and subcutaneous tissue: Secondary | ICD-10-CM | POA: Insufficient documentation

## 2013-07-14 DIAGNOSIS — R42 Dizziness and giddiness: Secondary | ICD-10-CM | POA: Insufficient documentation

## 2013-07-14 DIAGNOSIS — Z8673 Personal history of transient ischemic attack (TIA), and cerebral infarction without residual deficits: Secondary | ICD-10-CM | POA: Diagnosis not present

## 2013-07-14 DIAGNOSIS — R269 Unspecified abnormalities of gait and mobility: Secondary | ICD-10-CM

## 2013-07-14 DIAGNOSIS — Z880 Allergy status to penicillin: Secondary | ICD-10-CM | POA: Diagnosis not present

## 2013-07-14 DIAGNOSIS — S0990XA Unspecified injury of head, initial encounter: Secondary | ICD-10-CM

## 2013-07-14 DIAGNOSIS — J019 Acute sinusitis, unspecified: Secondary | ICD-10-CM | POA: Diagnosis not present

## 2013-07-14 DIAGNOSIS — Y9289 Other specified places as the place of occurrence of the external cause: Secondary | ICD-10-CM | POA: Diagnosis not present

## 2013-07-14 DIAGNOSIS — S298XXA Other specified injuries of thorax, initial encounter: Secondary | ICD-10-CM | POA: Diagnosis not present

## 2013-07-14 DIAGNOSIS — Z8669 Personal history of other diseases of the nervous system and sense organs: Secondary | ICD-10-CM | POA: Diagnosis not present

## 2013-07-14 DIAGNOSIS — Z87891 Personal history of nicotine dependence: Secondary | ICD-10-CM | POA: Diagnosis not present

## 2013-07-14 DIAGNOSIS — S0993XA Unspecified injury of face, initial encounter: Secondary | ICD-10-CM | POA: Diagnosis not present

## 2013-07-14 DIAGNOSIS — J329 Chronic sinusitis, unspecified: Secondary | ICD-10-CM | POA: Insufficient documentation

## 2013-07-14 DIAGNOSIS — Y9301 Activity, walking, marching and hiking: Secondary | ICD-10-CM | POA: Insufficient documentation

## 2013-07-14 DIAGNOSIS — T1490XA Injury, unspecified, initial encounter: Secondary | ICD-10-CM | POA: Diagnosis not present

## 2013-07-14 DIAGNOSIS — R2681 Unsteadiness on feet: Secondary | ICD-10-CM | POA: Insufficient documentation

## 2013-07-14 LAB — COMPREHENSIVE METABOLIC PANEL
ALBUMIN: 4.2 g/dL (ref 3.5–5.2)
ALK PHOS: 80 U/L (ref 39–117)
ALT: 11 U/L (ref 0–53)
AST: 30 U/L (ref 0–37)
BILIRUBIN TOTAL: 1.8 mg/dL — AB (ref 0.3–1.2)
BUN: 13 mg/dL (ref 6–23)
CHLORIDE: 103 meq/L (ref 96–112)
CO2: 24 mEq/L (ref 19–32)
Calcium: 9.6 mg/dL (ref 8.4–10.5)
Creatinine, Ser: 0.86 mg/dL (ref 0.50–1.35)
GFR calc non Af Amer: 75 mL/min — ABNORMAL LOW (ref >90)
GFR, EST AFRICAN AMERICAN: 87 mL/min — AB (ref >90)
GLUCOSE: 103 mg/dL — AB (ref 70–99)
POTASSIUM: 3.9 meq/L (ref 3.7–5.3)
SODIUM: 139 meq/L (ref 137–147)
TOTAL PROTEIN: 7 g/dL (ref 6.0–8.3)

## 2013-07-14 LAB — CBC
HEMATOCRIT: 42.1 % (ref 39.0–52.0)
HEMOGLOBIN: 14 g/dL (ref 13.0–17.0)
MCH: 31.5 pg (ref 26.0–34.0)
MCHC: 33.3 g/dL (ref 30.0–36.0)
MCV: 94.8 fL (ref 78.0–100.0)
Platelets: 189 10*3/uL (ref 150–400)
RBC: 4.44 MIL/uL (ref 4.22–5.81)
RDW: 13.8 % (ref 11.5–15.5)
WBC: 17.7 10*3/uL — AB (ref 4.0–10.5)

## 2013-07-14 LAB — DIFFERENTIAL
Basophils Absolute: 0 10*3/uL (ref 0.0–0.1)
Basophils Relative: 0 % (ref 0–1)
EOS ABS: 0.1 10*3/uL (ref 0.0–0.7)
EOS PCT: 1 % (ref 0–5)
LYMPHS ABS: 1.2 10*3/uL (ref 0.7–4.0)
Lymphocytes Relative: 7 % — ABNORMAL LOW (ref 12–46)
Monocytes Absolute: 3.4 10*3/uL — ABNORMAL HIGH (ref 0.1–1.0)
Monocytes Relative: 19 % — ABNORMAL HIGH (ref 3–12)
Neutro Abs: 13.1 10*3/uL — ABNORMAL HIGH (ref 1.7–7.7)
Neutrophils Relative %: 73 % (ref 43–77)

## 2013-07-14 LAB — PROTIME-INR
INR: 3.37 — ABNORMAL HIGH (ref 0.00–1.49)
PROTHROMBIN TIME: 32.9 s — AB (ref 11.6–15.2)

## 2013-07-14 MED ORDER — LEVOFLOXACIN 500 MG PO TABS
500.0000 mg | ORAL_TABLET | Freq: Once | ORAL | Status: AC
  Administered 2013-07-14: 500 mg via ORAL
  Filled 2013-07-14: qty 1

## 2013-07-14 MED ORDER — ACETAMINOPHEN 325 MG PO TABS
650.0000 mg | ORAL_TABLET | Freq: Four times a day (QID) | ORAL | Status: DC | PRN
  Administered 2013-07-14: 650 mg via ORAL
  Filled 2013-07-14: qty 2

## 2013-07-14 MED ORDER — MOXIFLOXACIN HCL 400 MG PO TABS: 400.0000 mg | ORAL_TABLET | Freq: Every day | ORAL | Status: AC

## 2013-07-14 MED ORDER — TETANUS-DIPHTH-ACELL PERTUSSIS 5-2.5-18.5 LF-MCG/0.5 IM SUSP
0.5000 mL | Freq: Once | INTRAMUSCULAR | Status: AC
  Administered 2013-07-14: 0.5 mL via INTRAMUSCULAR
  Filled 2013-07-14: qty 0.5

## 2013-07-14 NOTE — ED Notes (Addendum)
Report given to Adline Peals, RN at [facility]. Wife of patient has already called and patient is confirmed to come to this rehab center. AVS given and explained thoroughly to patient and wife. No other complaints at this time. Wife is ok to drive patient to center. Right hand scrapes wrapped with neosporin.

## 2013-07-14 NOTE — Discharge Instructions (Signed)
Thanks for your visit. - Your CT scan suggests you have sinusitis. Since you have a fever and an elevated white blood cell count, we are prescribing an antibiotic called moxifloxacin. Please take this daily with supper for 10 days. - Please followup with your primary care doctor later this week for ED followup and to see how your symptoms are doing. - Please hold your dose of Coumadin tomorrow. Ask your doctor to check your INR at the followup visit. - If you develop recurrent falls, worsening cough or shortness of breath, please return to the ED. - Recommend patient stay at [facility] rehab for the night for recovery and monitoring.

## 2013-07-14 NOTE — ED Notes (Addendum)
Per EMS- pt was walking to car to go to MD appt. Became dizzy and fell backwards. Landed on sacrum and hit head on ground. Has "scrape" on his head and small scrapes to the right hand. C/o pain in head and sacrum. Hx stroke and mild dementia. VSS BP 140/92 HR 92 RR 16. Hx afib. Pt is on coumadin.

## 2013-07-14 NOTE — ED Provider Notes (Signed)
CSN: [id number]     Arrival date & time 07/14/13  1257 History   First MD Initiated Contact with Patient 07/14/13 1320     Chief Complaint  Patient presents with  . Fall  . Dizziness     (Consider location/radiation/quality/duration/timing/severity/associated sxs/prior Treatment) HPI  Shawnee Petzold is a 80 y.o. male PMH mitral insufficiency with preserved EF 55-60%, not considered a surgical candidate, Chronic Inflammatory Demyelinating Polyradiculoneuropathy, A. fib on Coumadin, history of CVA, dementia who presents after a fall. Patient is accompanied by his wife.  Patient was walking to his car to go to a doctor's appointment and fell backwards. He says he felt dizzy. Denies a sensation of the sky spinning, but admits he felt lightheaded. He hit the back of his head on the ground as well as his right hand. No loss of consciousness. He has severe weakness at baseline 2/2 CIDP and his walking is very limited. He uses leg braces and is supposed to walk with a walker at all times, but was using a cane when he fell.  He says he did not sleep well last night because he was coughing. Endorses productive cough and subjective fever and chills x2 days. Denies chest pain, shortness of breath, worsening weakness, numbness.  He has had a pain in his left shoulder/neck area x1 week. He was actually going to his doctor today to have this worked up and talk about his cough. He denies any musculoskeletal pain currently.   Past Medical History  Diagnosis Date  . Hyperlipidemia   . Atrial fibrillation   . Osteopenia   . H/O: CVA (cerebrovascular accident)   . Unsteady gait   . Allergic rhinitis   . Nephrolithiasis   . Nevus, atypical   . Recurrent boils   . Hyperbilirubinemia   . Colonic polyp   . Mitral regurgitation    Past Surgical History  Procedure Laterality Date  . Appendectomy     History reviewed. No pertinent family history. History  Substance Use Topics  . Smoking status: Former  Research scientist (life sciences)  . Smokeless tobacco: Never Used  . Alcohol Use: Yes     Comment: usually have a couple of drinks a day    Review of Systems  Constitutional: Positive for fever and chills. Negative for appetite change and unexpected weight change.  Respiratory: Positive for cough. Negative for shortness of breath.   Cardiovascular: Negative for chest pain.  Neurological: Negative for syncope, weakness, numbness and headaches.    Allergies  Penicillins  Home Medications   Prior to Admission medications   Medication Sig Start Date End Date Taking? Authorizing Provider  alendronate (FOSAMAX) 70 MG tablet Take 70 mg by mouth every 7 (seven) days. Take with a full glass of water on an empty stomach.   Yes Historical Provider, MD  cetirizine (ZYRTEC) 10 MG tablet Take 10 mg by mouth as needed.    Yes Historical Provider, MD  metoprolol succinate (TOPROL-XL) 100 MG 24 hr tablet Take 100 mg by mouth daily. Take with or immediately following a meal.   Yes Historical Provider, MD  pravastatin (PRAVACHOL) 20 MG tablet Take 20 mg by mouth daily.   Yes Historical Provider, MD  warfarin (COUMADIN) 2 MG tablet Take 1-2 mg by mouth daily. TAKE 1/2 TABLET ON WEDNESDAY   Yes Historical Provider, MD   BP 142/72  Pulse 104  Temp(Src) 100 F (37.8 C) (Oral)  Resp 20  SpO2 92% Physical Exam  Constitutional: He is oriented to person, place,  and time. He appears well-developed and well-nourished.  Pleasant gentleman, in cervical collar  HENT:  Head: Normocephalic. Head is with abrasion. Head is without laceration, without right periorbital erythema and without left periorbital erythema.    Eyes: Conjunctivae and EOM are normal. Pupils are equal, round, and reactive to light.  Neck: No spinous process tenderness and no muscular tenderness present. No edema and no erythema present.    Cardiovascular: An irregularly irregular rhythm present.  Murmur (2/6 holosystolic murmur heard best at the left lower  sternal border) heard. Pulmonary/Chest: Effort normal and breath sounds normal. No respiratory distress. He has no wheezes. He has no rales. He exhibits no tenderness.  Musculoskeletal: Normal range of motion. He exhibits no edema and no tenderness.  Very weak, e.g. needs to be held up for lung exam  Neurological: He is alert and oriented to person, place, and time. No cranial nerve deficit.  Skin: Skin is warm and dry.  Psychiatric: He has a normal mood and affect.    ED Course  Procedures (including critical care time) Labs Review Labs Reviewed  CBC - Abnormal; Notable for the following:    WBC 17.7 (*)    All other components within normal limits  COMPREHENSIVE METABOLIC PANEL - Abnormal; Notable for the following:    Glucose, Bld 103 (*)    Total Bilirubin 1.8 (*)    GFR calc non Af Amer 75 (*)    GFR calc Af Amer 87 (*)    All other components within normal limits  PROTIME-INR - Abnormal; Notable for the following:    Prothrombin Time 32.9 (*)    INR 3.37 (*)    All other components within normal limits  DIFFERENTIAL - Abnormal; Notable for the following:    Neutro Abs 13.1 (*)    Lymphocytes Relative 7 (*)    Monocytes Relative 19 (*)    Monocytes Absolute 3.4 (*)    All other components within normal limits    Imaging Review Dg Chest 2 View  07/14/2013   CLINICAL DATA:  FALL DIZZINESS  EXAM: CHEST  2 VIEW  COMPARISON:  DG CHEST 2 VIEW dated 08/09/2008  FINDINGS: Low lung volumes. Cardiac silhouette is enlarged. The aorta is tortuous with atherosclerotic calcifications. There is diffuse prominence of interstitial markings accentuated by the low lung volumes, without peribronchial cuffing. No focal regions of consolidation or focal infiltrates. There is no evidence of pneumothorax nor acute osseous abnormalities.  IMPRESSION: Low lung volumes.  Cardiomegaly.  Pulmonary vascular congestion possibly component of chronic bronchitic changes.   Electronically Signed   By: Margaree Mackintosh M.D.   On: 07/14/2013 15:14   Ct Head Wo Contrast  07/14/2013   CLINICAL DATA:  Dizzy. Fell and hit head. History of old stroke. Anticoagulation.  EXAM: CT HEAD WITHOUT CONTRAST  CT CERVICAL SPINE WITHOUT CONTRAST  TECHNIQUE: Multidetector CT imaging of the head and cervical spine was performed following the standard protocol without intravenous contrast. Multiplanar CT image reconstructions of the cervical spine were also generated.  COMPARISON:  07/01/2012.  FINDINGS: CT HEAD FINDINGS  The patient was unable to remain motionless for the exam. Small or subtle lesions could be overlooked.  No evidence for acute infarction, hemorrhage, mass lesion, hydrocephalus, or extra-axial fluid. Generalized moderately severe atrophy. Chronic microvascular ischemic change. Remote lacunar infarcts. Remote right frontal cortical and subcortical infarct. Calvarium intact. No acute mastoid disease. Chronic appearing ethmoid sinusitis. Similar intracranial appearance to priors.  Air-fluid level left maxillary sinus. If  there was facial trauma as result of the fall, an occult facial fracture is not excluded on this exam. Consider CT maxillofacial if clinically indicated.  CT CERVICAL SPINE FINDINGS  There is no visible cervical spine fracture, traumatic subluxation, prevertebral soft tissue swelling, or intraspinal hematoma. Moderate spondylosis. Chronic C6-C7 fusion. No neck masses. Atherosclerosis. No lung apex lesion or pneumothorax.  IMPRESSION: Chronic changes as described in the intracranial compartment. No acute abnormality is evident.  No cervical spine fracture or traumatic subluxation.  Air-fluid level left maxillary sinus could represent ordinary acute sinus disease. If however, there is left facial trauma, consider CT maxillofacial for further investigation.   Electronically Signed   By: Rolla Flatten M.D.   On: 07/14/2013 14:49   Ct Cervical Spine Wo Contrast  07/14/2013   CLINICAL DATA:  Dizzy. Fell and hit  head. History of old stroke. Anticoagulation.  EXAM: CT HEAD WITHOUT CONTRAST  CT CERVICAL SPINE WITHOUT CONTRAST  TECHNIQUE: Multidetector CT imaging of the head and cervical spine was performed following the standard protocol without intravenous contrast. Multiplanar CT image reconstructions of the cervical spine were also generated.  COMPARISON:  07/01/2012.  FINDINGS: CT HEAD FINDINGS  The patient was unable to remain motionless for the exam. Small or subtle lesions could be overlooked.  No evidence for acute infarction, hemorrhage, mass lesion, hydrocephalus, or extra-axial fluid. Generalized moderately severe atrophy. Chronic microvascular ischemic change. Remote lacunar infarcts. Remote right frontal cortical and subcortical infarct. Calvarium intact. No acute mastoid disease. Chronic appearing ethmoid sinusitis. Similar intracranial appearance to priors.  Air-fluid level left maxillary sinus. If there was facial trauma as result of the fall, an occult facial fracture is not excluded on this exam. Consider CT maxillofacial if clinically indicated.  CT CERVICAL SPINE FINDINGS  There is no visible cervical spine fracture, traumatic subluxation, prevertebral soft tissue swelling, or intraspinal hematoma. Moderate spondylosis. Chronic C6-C7 fusion. No neck masses. Atherosclerosis. No lung apex lesion or pneumothorax.  IMPRESSION: Chronic changes as described in the intracranial compartment. No acute abnormality is evident.  No cervical spine fracture or traumatic subluxation.  Air-fluid level left maxillary sinus could represent ordinary acute sinus disease. If however, there is left facial trauma, consider CT maxillofacial for further investigation.   Electronically Signed   By: Rolla Flatten M.D.   On: 07/14/2013 14:49     EKG Interpretation   Date/Time:  Tuesday Jul 14 2013 13:43:13 EDT Ventricular Rate:  92 PR Interval:  165 QRS Duration: 88 QT Interval:  383 QTC Calculation: 474 R Axis:   57 Text  Interpretation:  Sinus or ectopic atrial tachycardia Multiform  ventricular premature complexes Since previous tracing pvcs are now  present Confirmed by Canary Brim  MD, [place] [phone]) on 07/14/2013 3:18:48 PM     Rate 92. Irregularly irregular rhythm. No concerning ST or T wave changes. 1 PVC.  MDM   Final diagnoses:  Sinusitis  Fall    Elliot Meldrum is a 80 y.o. male PMH mitral insufficiency with preserved EF 55-60%, not considered a surgical candidate, CIDP, A. fib on Coumadin, history of CVA, dementia who presents after a fall. Denies vertigo or loss of consciousness. On history he reports productive cough and subjective fever x2 days. If he has an infection this may have contributed to his lightheadedness. He has a low grade fever here. EKG is okay. He is not orthostatic.  We'll obtain CT head as he is on coumadin. Exam was not concerning for cervical spine injury but he does  endorse a pain in his left neck present x1 week. We will just extend the CT to his cervical spine. Will obtain chest x-ray and basic labs, PT/INR. Provide Tdap. Tylenol for pain control and antipyretic.  2:45PM CBC is notable for leukocytosis to 17.7. Neutrophil # is incerased. CMP unremarkable except for slightly elevated total bilirubin to 1.8. INR is a little supra-therapeutic at 3.37. Will recommend patient hold Coumadin for 1 dose.  3:00PM CT head/neck shows no acute abnormality in the head. No cervical spine fracture or traumatic subluxation. Air-fluid level left maxillary sinus could represent ordinary acute sinus disease.   3:30PM CXR negative for infiltrate, shows chronic bronchitic changes. Given WBC and low-grade fever, we'll treat for sinusitis with a respiratory flouroquinolone. Patient is stable for discharge. He is going to stay at the [facility] rehab facility for the night per family wishes.  Lesly Dukes, MD 07/14/13 [phone]

## 2013-07-14 NOTE — Progress Notes (Signed)
Patient ID: Rodney Malone, male   DOB: September 20, 1922, 80 y.o.   MRN: [id number]   [facility] [phone])  Code Status: Living Will, Full Code  Contact Information   Name Relation Home Work [place] Spouse [phone]         Chief Complaint  Patient presents with  . Fall, closed head injury  . Sinusitis  . Gait Problem    HPI: This is an 80 year old male resident of [facility], independent living section. He was evaluated in the ED today after a fall at his home. He reports walking back from the mailbox he became a little bit dizzy and fell backwards hitting his head. There was no loss of consciousness.  ED evaluation included laboratory studies, CT of head and neck, and chest x-ray. CBC is notable for leukocytosis to 17.7. Neutrophil # is incerased. CMP unremarkable except for slightly elevated total bilirubin to 1.8. INR is a little supra-therapeutic at 3.37. CT head/neck showed no acute abnormality in the head. No cervical spine fracture or traumatic subluxation. Air-fluid level left maxillary sinus c/w acute sinus disease. CXR negative for infiltrate, shows chronic bronchitic changes. Due to WBC and low-grade fever, CT findings, treatment was started for sinusitis with a respiratory flouroquinolone.  Patient is medically stable for discharge home. The family requested at least overnight stay in rehabilitation section due to his unsteady gait.    Allergies  Allergen Reactions  . Penicillins Other (See Comments)    UNKNOWN    MEDICATIONS -     Medication List       This list is accurate as of: 07/14/13  6:03 PM.  Always use your most recent med list.               alendronate 70 MG tablet  Commonly known as:  FOSAMAX  Take 70 mg by mouth every 7 (seven) days. Take with a full glass of water on an empty stomach.     cetirizine 10 MG tablet  Commonly known as:  ZYRTEC  Take 10 mg by mouth as needed.     metoprolol  succinate 100 MG 24 hr tablet  Commonly known as:  TOPROL-XL  Take 100 mg by mouth daily. Take with or immediately following a meal.     moxifloxacin 400 MG tablet  Commonly known as:  AVELOX  Take 1 tablet (400 mg total) by mouth daily at 8 pm.  Start taking on:  07/15/2013     pravastatin 20 MG tablet  Commonly known as:  PRAVACHOL  Take 20 mg by mouth daily.     warfarin 2 MG tablet  Commonly known as:  COUMADIN  Take 1-2 mg by mouth daily. TAKE 1/2 TABLET ON WEDNESDAY         DATA REVIEWED  Radiologic Exams:  07/14/2013 CT HEAD WITHOUT CONTRAST   FINDINGS: CT HEAD FINDINGS The patient was unable to remain motionless for the exam. Small or subtle lesions could be overlooked. No evidence for acute infarction, hemorrhage, mass lesion, hydrocephalus, or extra-axial fluid. Generalized moderately severe atrophy. Chronic microvascular ischemic change. Remote lacunar infarcts. Remote right frontal cortical and subcortical infarct. Calvarium intact. No acute mastoid disease. Chronic appearing ethmoid sinusitis. Similar intracranial appearance to priors. Air-fluid level left maxillary sinus. If there was facial trauma as result of the fall, an occult facial fracture is not excluded on this exam. Consider CT maxillofacial if clinically indicated.  CT CERVICAL SPINE FINDINGS There is no visible cervical  spine fracture, traumatic subluxation, prevertebral soft tissue swelling, or intraspinal hematoma. Moderate spondylosis. Chronic C6-C7 fusion. No neck masses. Atherosclerosis. No lung apex lesion or pneumothorax.    CHEST 2 VIEW  FINDINGS:  Low lung volumes. Cardiac silhouette is enlarged. The aorta is tortuous with atherosclerotic calcifications. There is diffuse prominence of interstitial markings accentuated by the low lung  volumes, without peribronchial cuffing. No focal regions of consolidation or focal infiltrates. There is no evidence of pneumothorax nor acute osseous  abnormalities.    Cardiovascular Exams:  EKG Interpretation  Date/Time: Tuesday Jul 14 2013 13:43:13 EDT  Ventricular Rate: 92, PR Interval: 165,QRS Duration: 88 QT Interval: 383  QTC Calculation: 474  R Axis: 57  Text Interpretation: Sinus or ectopic atrial tachycardia Multiform ventricular premature complexes Since previous tracing pvcs are now present    Laboratory Studies: Lab Results  Component Value Date   WBC 17.7* 07/14/2013   HGB 14.0 07/14/2013   HCT 42.1 07/14/2013   MCV 94.8 07/14/2013   PLT 189 07/14/2013   Lab Results  Component Value Date   NA 139 07/14/2013   K 3.9 07/14/2013   BUN 13 07/14/2013   CREATININE 0.86 07/14/2013   Lab Results  Component Value Date   NA 139 07/14/2013   K 3.9 07/14/2013   CL 103 07/14/2013   CO2 24 07/14/2013   GLUCOSE 103* 07/14/2013   BUN 13 07/14/2013   CREATININE 0.86 07/14/2013   CALCIUM 9.6 07/14/2013   ALBUMIN 4.2 07/14/2013   AST 30 07/14/2013   ALT 11 07/14/2013   ALKPHOS 80 07/14/2013   BILITOT 1.8* 07/14/2013   GFRNONAA 75* 07/14/2013   GFRAA 87* 07/14/2013   Lab Results  Component Value Date   INR 3.37* 07/14/2013       Past Medical History  Diagnosis Date  . Hyperlipidemia   . Atrial fibrillation   . Osteopenia   . H/O: CVA (cerebrovascular accident)   . Unsteady gait   . Allergic rhinitis   . Nephrolithiasis   . Nevus, atypical   . Recurrent boils   . Hyperbilirubinemia   . Colonic polyp   . Mitral regurgitation   . CIDP (chronic inflammatory demyelinating polyneuropathy)   . Long term (current) use of anticoagulants    Past Surgical History  Procedure Laterality Date  . Appendectomy     Family Status  Relation Status Death Age  . Mother Deceased 31  . Father Deceased   . Sister Deceased 72  . Brother Deceased 36   History   Social History Narrative   Patient is Married([name]). Retired. Lives in  single level home, Independent Living  section at [place] since 2009.   Former smoker, minimal Alcohol  history   Patient has Advanced planning documents: Living Will              REVIEW OF SYSTEMS  DATA OBTAINED: from patient, GENERAL: Feels "OK". Reports he's had a neck ache for the last week, and no sleep last night due to this pain. Denies neck pain now.   No recent fever, fatigue, change in appetite or weight SKIN: No itch, rash  bones on fingers EYES: No eye pain, dryness or itching  No change in vision EARS: No earache, tinnitus, change in hearing NOSE: Recent Congestion  No bleeding MOUTH/THROAT: No mouth or tooth pain  Mild sore throat   No difficulty chewing or swallowing RESPIRATORY: Recent cough, No wheezing, SOB CARDIAC: No chest pain, palpitations  No edema. GI:  No abdominal pain  No nausea, vomiting,diarrhea or constipation  No heartburn or reflux  GU: No dysuria, frequency or urgency  No change in urine volume or character MUSCULOSKELETAL: No joint pain, swelling or stiffness  No back pain  chronic lower extremity muscle weakness (CIDP) wears braces. Chronic gait instability .  NEUROLOGIC: No dizziness, fainting, headache,  No change in mental status.  PSYCHIATRIC: No feelings of anxiety, depression  Sleeps well.  No behavior issue.    PHYSICAL EXAM Filed Vitals:   07/14/13 1736  BP: 140/74  Pulse: 107  Temp: 97.2 F (36.2 C)  Resp: 20  SpO2: 90%   There is no weight on file to calculate BMI.  GENERAL APPEARANCE: No acute distress, appropriately groomed, normal body habitus. Alert, pleasant, conversant. SKIN: No diaphoresis, rash, unusual lesions,  superficial abrasions on fingers of left hand  HEAD: Posterior scalp with area of ecchymosis, small hematoma. Skin is intact EYES: Conjunctiva/lids clear. Pupils round, reactive. EOMs intact.  EARS: External exam WNL,. Hearing grossly normal. NOSE: No deformity or discharge. MOUTH/THROAT: Lips w/o lesions. Oral mucosa, tongue moist, w/o lesion. Oropharynx w/o redness or lesions.  NECK: No thyroid tenderness,  enlargement or nodule LYMPHATICS: No head, neck or supraclavicular adenopathy RESPIRATORY: Breathing is even, unlabored. Lung sounds are clear and full.  CARDIOVASCULAR: Heart IRRR. No murmur or extra heart sounds  EDEMA: No peripheral edema. No ascites GASTROINTESTINAL: Abdomen is soft, non-tender, not distended w/ normal bowel sounds. MUSCULOSKELETAL: Moves UE extremities with full ROM, strength and tone. Back is without kyphosis, scoliosis or spinal process tenderness. Gait is not assessed this afternoon, braces are placed on both lower legs NEUROLOGIC: Oriented to time, place, person. Cranial nerves 2-12 grossly intact, speech clear, no tremor. PSYCHIATRIC: Mood and affect appropriate to situation   ASSESSMENT/PLAN  Mild closed head injury Backwards on pavement today striking the back of his head. No loss of consciousness. No skull or intracranial abnormality on CT scan. Patient denies any headache nausea. Will be observed in the rehabilitation unit at least overnight. Anticipate d/c home in next 24-48 hours  Long term (current) use of anticoagulants INR mildly elevated today, hold warfarin dose tonight repeat INR in the morning.  CIDP (chronic inflammatory demyelinating polyneuropathy) This chronic problem has affected patient's mobility for quite some time. He does wear braces on both legs, is fully aware of his gait instability. Admits today's fall likely due to poor judgment on his part. No specific interventions at this time  Unsteady gait Related to CIDP, the patient ambulates with walker at all times wears lower extremity braces. Nursing staff will monitor his ventilatory status over the next in 24 hours. If no significant change from baseline anticipate discharge to his independent living  Atrial fibrillation Heart in irregular rhythm, rate well controlled. Continue current medications including anticoagulation    Family/ staff Communication:     Goals of care:   Return to  independent living   Labs/tests ordered: 07/15/2013   INR   Follow up: Return for As needed.  Mardene Celeste, NP-C [place] [phone]  07/14/2013

## 2013-07-14 NOTE — ED Notes (Signed)
Urinal at bedside.  

## 2013-07-14 NOTE — ED Notes (Addendum)
Per patient-became dizzy when walking to his car and fell. Reports hitting his head. Pt is taking coumadin for a stroke he had "around 15 years ago." Has fallen x2 in the past two years. Feels like he has a "virus." Has a strong, dry cough starting yesterday and endorses sputum. Does c/o pain in the back of the neck but states "I had this pain before I fell but nothing happened that I can think of to make it hurt." Cervical collar applied. Wife at bedside to take all belongings.  Reports chills last night and not being able to sleep well. EKG completed. Stroke swallow screen completed. Denies N, V, D.

## 2013-07-15 DIAGNOSIS — S0990XA Unspecified injury of head, initial encounter: Secondary | ICD-10-CM | POA: Insufficient documentation

## 2013-07-15 NOTE — Assessment & Plan Note (Signed)
Backwards on pavement today striking the back of his head. No loss of consciousness. No skull or intracranial abnormality on CT scan. Patient denies any headache nausea. Will be observed in the rehabilitation unit at least overnight. Anticipate d/c home in next 24-48 hours

## 2013-07-15 NOTE — Assessment & Plan Note (Signed)
Heart in irregular rhythm, rate well controlled. Continue current medications including anticoagulation

## 2013-07-15 NOTE — Assessment & Plan Note (Signed)
INR mildly elevated today, hold warfarin dose tonight repeat INR in the morning.

## 2013-07-15 NOTE — ED Provider Notes (Signed)
I saw and evaluated the patient, reviewed the resident's note and I agree with the findings and plan.   EKG Interpretation   Date/Time:  Tuesday Jul 14 2013 13:43:13 EDT Ventricular Rate:  92 PR Interval:  165 QRS Duration: 88 QT Interval:  383 QTC Calculation: 474 R Axis:   57 Text Interpretation:  Sinus or ectopic atrial tachycardia Multiform  ventricular premature complexes Since previous tracing pvcs are now  present Confirmed by Canary Brim  MD, [place] [phone]) on 07/14/2013 3:18:48 PM     ROS reviewed and all otherwise negative except for mentioned in HPI  Pt seen and examined.  Pt presenting with lightheadedness and resultant fall.  Pt is on coumadin for afib.  He is awake, alert, NAD.  No increased respiratory effort.  INR is 3.4- pt advised to hold one dose of coumadin.  Discharged with strict return precautions.  Pt agreeable with plan.  Threasa Beards, MD 07/15/13 [phone]

## 2013-07-15 NOTE — Assessment & Plan Note (Signed)
Related to CIDP, the patient ambulates with walker at all times wears lower extremity braces. Nursing staff will monitor his ventilatory status over the next in 24 hours. If no significant change from baseline anticipate discharge to his independent living

## 2013-07-15 NOTE — Assessment & Plan Note (Signed)
This chronic problem has affected patient's mobility for quite some time. He does wear braces on both legs, is fully aware of his gait instability. Admits today's fall likely due to poor judgment on his part. No specific interventions at this time

## 2013-07-17 DIAGNOSIS — Z7901 Long term (current) use of anticoagulants: Secondary | ICD-10-CM | POA: Diagnosis not present

## 2013-07-17 DIAGNOSIS — J019 Acute sinusitis, unspecified: Secondary | ICD-10-CM | POA: Diagnosis not present

## 2013-07-17 DIAGNOSIS — I4891 Unspecified atrial fibrillation: Secondary | ICD-10-CM | POA: Diagnosis not present

## 2013-07-17 DIAGNOSIS — Z9181 History of falling: Secondary | ICD-10-CM | POA: Diagnosis not present

## 2013-07-17 DIAGNOSIS — R42 Dizziness and giddiness: Secondary | ICD-10-CM | POA: Diagnosis not present

## 2013-07-17 DIAGNOSIS — Z681 Body mass index (BMI) 19 or less, adult: Secondary | ICD-10-CM | POA: Diagnosis not present

## 2013-07-21 DIAGNOSIS — I4891 Unspecified atrial fibrillation: Secondary | ICD-10-CM | POA: Diagnosis not present

## 2013-07-21 DIAGNOSIS — Z7901 Long term (current) use of anticoagulants: Secondary | ICD-10-CM | POA: Diagnosis not present

## 2013-07-28 DIAGNOSIS — M25559 Pain in unspecified hip: Secondary | ICD-10-CM | POA: Diagnosis not present

## 2013-07-28 DIAGNOSIS — Z9181 History of falling: Secondary | ICD-10-CM | POA: Diagnosis not present

## 2013-07-30 DIAGNOSIS — Z9181 History of falling: Secondary | ICD-10-CM | POA: Diagnosis not present

## 2013-07-30 DIAGNOSIS — R279 Unspecified lack of coordination: Secondary | ICD-10-CM | POA: Diagnosis not present

## 2013-07-30 DIAGNOSIS — R269 Unspecified abnormalities of gait and mobility: Secondary | ICD-10-CM | POA: Diagnosis not present

## 2013-08-04 DIAGNOSIS — I4891 Unspecified atrial fibrillation: Secondary | ICD-10-CM | POA: Diagnosis not present

## 2013-08-04 DIAGNOSIS — Z7901 Long term (current) use of anticoagulants: Secondary | ICD-10-CM | POA: Diagnosis not present

## 2013-08-05 DIAGNOSIS — R269 Unspecified abnormalities of gait and mobility: Secondary | ICD-10-CM | POA: Diagnosis not present

## 2013-08-05 DIAGNOSIS — Z9181 History of falling: Secondary | ICD-10-CM | POA: Diagnosis not present

## 2013-08-05 DIAGNOSIS — R279 Unspecified lack of coordination: Secondary | ICD-10-CM | POA: Diagnosis not present

## 2013-08-14 DIAGNOSIS — Z7901 Long term (current) use of anticoagulants: Secondary | ICD-10-CM | POA: Diagnosis not present

## 2013-08-14 DIAGNOSIS — I080 Rheumatic disorders of both mitral and aortic valves: Secondary | ICD-10-CM | POA: Diagnosis not present

## 2013-08-14 DIAGNOSIS — I4891 Unspecified atrial fibrillation: Secondary | ICD-10-CM | POA: Diagnosis not present

## 2013-08-14 DIAGNOSIS — IMO0002 Reserved for concepts with insufficient information to code with codable children: Secondary | ICD-10-CM | POA: Diagnosis not present

## 2013-08-14 DIAGNOSIS — R279 Unspecified lack of coordination: Secondary | ICD-10-CM | POA: Diagnosis not present

## 2013-08-14 DIAGNOSIS — Z9181 History of falling: Secondary | ICD-10-CM | POA: Diagnosis not present

## 2013-08-14 DIAGNOSIS — F068 Other specified mental disorders due to known physiological condition: Secondary | ICD-10-CM | POA: Diagnosis not present

## 2013-08-14 DIAGNOSIS — G6181 Chronic inflammatory demyelinating polyneuritis: Secondary | ICD-10-CM | POA: Diagnosis not present

## 2013-08-14 DIAGNOSIS — R269 Unspecified abnormalities of gait and mobility: Secondary | ICD-10-CM | POA: Diagnosis not present

## 2013-08-24 ENCOUNTER — Encounter: Payer: Self-pay | Admitting: Podiatry

## 2013-08-24 ENCOUNTER — Ambulatory Visit (INDEPENDENT_AMBULATORY_CARE_PROVIDER_SITE_OTHER): Payer: Medicare Other | Admitting: Podiatry

## 2013-08-24 VITALS — BP 127/71 | HR 68 | Resp 18

## 2013-08-24 DIAGNOSIS — B351 Tinea unguium: Secondary | ICD-10-CM

## 2013-08-24 DIAGNOSIS — M79609 Pain in unspecified limb: Secondary | ICD-10-CM

## 2013-08-24 NOTE — Progress Notes (Signed)
Patient ID: Rodney Malone, male   DOB: December 17, 1922, 80 y.o.   MRN: [id number]  Subjective: Orientated x3 white male presents complaining of painful toenails  Objective: Brittle, discolored, hypertrophic toenails x10  Assessment: Symptomatic onychomycoses x10  Plan: Nails x10 are debrided without a bleeding  Reappoint at three-month intervals

## 2013-08-25 DIAGNOSIS — I4891 Unspecified atrial fibrillation: Secondary | ICD-10-CM | POA: Diagnosis not present

## 2013-08-25 DIAGNOSIS — I6350 Cerebral infarction due to unspecified occlusion or stenosis of unspecified cerebral artery: Secondary | ICD-10-CM | POA: Diagnosis not present

## 2013-08-25 DIAGNOSIS — Z7901 Long term (current) use of anticoagulants: Secondary | ICD-10-CM | POA: Diagnosis not present

## 2013-08-26 ENCOUNTER — Other Ambulatory Visit: Payer: Self-pay | Admitting: *Deleted

## 2013-08-26 ENCOUNTER — Encounter: Payer: Self-pay | Admitting: Neurology

## 2013-08-26 ENCOUNTER — Ambulatory Visit (INDEPENDENT_AMBULATORY_CARE_PROVIDER_SITE_OTHER): Payer: Medicare Other | Admitting: Neurology

## 2013-08-26 VITALS — BP 104/66 | HR 68 | Resp 14 | Ht 71.0 in | Wt 176.0 lb

## 2013-08-26 DIAGNOSIS — M216X9 Other acquired deformities of unspecified foot: Secondary | ICD-10-CM

## 2013-08-26 DIAGNOSIS — R413 Other amnesia: Secondary | ICD-10-CM | POA: Diagnosis not present

## 2013-08-26 DIAGNOSIS — G629 Polyneuropathy, unspecified: Secondary | ICD-10-CM

## 2013-08-26 DIAGNOSIS — G6181 Chronic inflammatory demyelinating polyneuritis: Secondary | ICD-10-CM

## 2013-08-26 DIAGNOSIS — G589 Mononeuropathy, unspecified: Secondary | ICD-10-CM | POA: Diagnosis not present

## 2013-08-26 DIAGNOSIS — M21379 Foot drop, unspecified foot: Secondary | ICD-10-CM

## 2013-08-26 NOTE — Progress Notes (Signed)
NEUROLOGY CONSULTATION NOTE  Rodney Malone MRN: [id number] DOB: 1922-10-27  Referring provider: Dr. [name] Primary care provider: Dr. [name]  Reason for consult:  Second opinion for CIDP  Dear Dr Brigitte Pulse:  Thank you for your kind referral of Rodney Malone for consultation of the above symptoms. Although his history is well known to you, please allow me to reiterate it for the purpose of our medical record. Records and images were personally reviewed where available.  HISTORY OF PRESENT ILLNESS: This is a very pleasant 80 year old left-handed man with a history of atrial fibrillation on anticoagulation with Coumadin, right frontal stroke in 2004 with no residual deficits, hyperlipidemia, and chronic inflammatory demyelinating polyneuropathy (CIDP) diagnosed around 15 years ago.  He recalls symptoms started after he stumbled and broke 2 toes on the left feet, followed by weakness in both feet and legs.  He has been wearing AFOs for the past 10 years.  He reports numbness and tingling in both feet up to his ankles.  He also has numbness and tingling in both hands and has has 4 different surgeries for Dupuytren's contractures.  He feel 6 weeks ago while walking with his cane when his legs gave out. He did not lose consciousness. He fell backwards and hit his head, head CT done 07/14/13 personally reviewed showing right frontal encephalomalacia, no acute findings.  He feels his legs are getting weaker and the main problem on a continued basis is his balance.  He reports going to [facility] for several years but was told to "just stay in [place] because we can't do anything for you." The last time he did PT was 7 years ago.  Records from [facility] from 2004 to 2011 were reviewed. He was seeing neuromuscular specialist Dr. Roselyn Meier. 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  He was tried on multiple therapies, including IVIG, plasmapharesis, Cellcept, and steroids without major benefit. They had tried Lyrica and Cymbalta for the dysesthesias but had side effects of nausea. In 2009, he was noted to have a violaceous discoloration in both feet that appeared to be venostasis. The patient does not recall trying the different therapies.  He denies any diplopia, dysarthria, dysphagia. He has occasional mild headaches that resolve with Tylenol.  He has very little dizziness when moving around and holds on to his walker.  He has a tremor in both hands and notes some difficulty writing and holding a glass of water steady.  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  He does occasionally forget names and conversations, but denies getting lost driving, does not forget to pay bills or his medications.    Laboratory Data: Lab Results  Component Value Date   WBC 17.7* 07/14/2013   HGB 14.0 07/14/2013   HCT 42.1 07/14/2013   MCV 94.8 07/14/2013   PLT 189 07/14/2013     Chemistry      Component Value Date/Time   NA 139 07/14/2013 1354   K 3.9 07/14/2013 1354   CL 103 07/14/2013 1354   CO2 24 07/14/2013 1354   BUN 13 07/14/2013 1354   CREATININE 0.86 07/14/2013 1354      Component Value Date/Time   CALCIUM 9.6 07/14/2013 1354   ALKPHOS 80 07/14/2013 1354   AST 30 07/14/2013 1354   ALT 11 07/14/2013 1354   BILITOT 1.8* 07/14/2013 1354  PAST MEDICAL HISTORY: Past Medical History  Diagnosis Date  . Hyperlipidemia   . Atrial fibrillation   . Osteopenia   . H/O: CVA (cerebrovascular accident)   . Unsteady gait   . Allergic rhinitis   . Nephrolithiasis   . Nevus, atypical   . Recurrent boils   . Hyperbilirubinemia   . Colonic polyp   . Mitral regurgitation   . CIDP (chronic inflammatory demyelinating polyneuropathy)   . Long term (current) use of anticoagulants     PAST SURGICAL HISTORY: Past Surgical History  Procedure Laterality Date  .  Appendectomy      MEDICATIONS: Current Outpatient Prescriptions on File Prior to Visit  Medication Sig Dispense Refill  . alendronate (FOSAMAX) 70 MG tablet Take 70 mg by mouth every 7 (seven) days. Take with a full glass of water on an empty stomach.      . cetirizine (ZYRTEC) 10 MG tablet Take 10 mg by mouth as needed.       . metoprolol succinate (TOPROL XL) 100 MG 24 hr tablet 100 mg      . pravastatin (PRAVACHOL) 20 MG tablet Take 20 mg by mouth daily.      . urea (CARMOL) 40 % CREA 40 %      . warfarin (COUMADIN) 2 MG tablet Take 1-2 mg by mouth daily. TAKE 1/2 TABLET ON WEDNESDAY       No current facility-administered medications on file prior to visit.    ALLERGIES: Allergies  Allergen Reactions  . Penicillins Other (See Comments)    UNKNOWN    FAMILY HISTORY: No family history on file.  SOCIAL HISTORY: History   Social History  . Marital Status: Married    Spouse Name: N/A    Number of Children: 2  . Years of Education: N/A   Occupational History  . broadcasting    Social History Main Topics  . Smoking status: Former Research scientist (life sciences)  . Smokeless tobacco: Never Used  . Alcohol Use: Yes     Comment: usually have a couple of drinks a day  . Drug Use: No  . Sexual Activity: Not on file   Other Topics Concern  . Not on file   Social History Narrative   Patient is Married([name]). Retired. Lives in  single level home, Independent Living  section at [place] since 2009.   Former smoker, minimal Alcohol history   Patient has Advanced planning documents: Living Will             REVIEW OF SYSTEMS: Constitutional: No fevers, chills, or sweats, no generalized fatigue, change in appetite Eyes: No visual changes, double vision, eye pain Ear, nose and throat: No hearing loss, ear pain, nasal congestion, sore throat Cardiovascular: No chest pain, palpitations Respiratory:  No shortness of breath at rest or with exertion, wheezes GastrointestinaI:  No nausea, vomiting, diarrhea, abdominal pain, fecal incontinence Genitourinary:  No dysuria, urinary retention or frequency Musculoskeletal:  No neck pain, back pain Integumentary: No rash, pruritus, skin lesions Neurological: as above Psychiatric: No depression, insomnia, anxiety Endocrine: No palpitations, fatigue, diaphoresis, mood swings, change in appetite, change in weight, increased thirst Hematologic/Lymphatic:  No anemia, purpura, petechiae. Allergic/Immunologic: no itchy/runny eyes, nasal congestion, recent allergic reactions, rashes  PHYSICAL EXAM: Filed Vitals:   08/26/13 1027  BP: 104/66  Pulse: 68  Resp: 14   General: No acute distress Head:  Normocephalic/atraumatic Eyes: Fundoscopic exam shows bilateral sharp discs, no vessel changes, exudates, or hemorrhages Neck: supple, no paraspinal tenderness, full  range of motion Back: No paraspinal tenderness Heart: regular rate and rhythm Lungs: Clear to auscultation bilaterally. Vascular: No carotid bruits. Skin/Extremities: uses AFOs on both feet, violaceous discoloration in both feet up to ankles, intact bipedal pulses Neurological Exam: Mental status: alert and oriented to person, place, and time, no dysarthria or aphasia, Fund of knowledge is appropriate.  Recent and remote memory are intact.  Attention and concentration are normal.    Able to name objects and repeat phrases. MMSE 28/30 (missed 2 points for delayed recall) Cranial nerves: CN I: not tested CN II: pupils equal, round and reactive to light, visual fields intact, fundi unremarkable. CN III, IV, VI:  full range of motion, no nystagmus, no ptosis CN V: facial sensation intact CN VII: upper and lower face symmetric CN VIII: hearing intact to finger rub CN IX, X: gag intact, uvula midline CN XI: sternocleidomastoid and trapezius muscles intact CN XII: tongue midline Bulk & Tone: significant atrophy in both lower legs and feet. He has contractures on the  right 3rd and 5th digits, left 2nd and 5th digits. no fasciculations. Motor: 5/5 on both UE, 4/5 bilateral interossei with note of contractures as above.  He has 5/5 strength on bilateral hip flexors, extensors, knee flexion/extension, 0/5 bilateral dorsiflexion and toe extension, 3/5 bilateral eversion, 3+/5 bilateral inversion, 3+/5 plantarflexion. Sensation: decreased to cold on both feet, right>left; decreased pin on right foot to ankle, decreased vibration up to ankle on right, up to knee on left.  Romberg test positive Deep Tendon Reflexes: +2 on right UE, +1 left UE, absent on both LE. no ankle clonus Plantar responses: downgoing bilaterally Cerebellar: no incoordination on finger to nose testing Gait: ambulates with walker and AFOs in both feet, slow and cautious Tremor: none today  IMPRESSION: This is a very pleasant 80 year old left-handed man with a history of atrial fibrillation on Coumadin, right frontal stroke with no residual deficits, hyperlipidemia, and probable CIDP with bilateral foot drop.  He reports that "nothing could be done at [facility]," however on review of records he has tried multiple therapies including IVIG, plasmapharesis, and immunosuppression with Cellcept, with minimal benefit.  He feels his balance is getting worse and we discussed supportive treatment with physical therapy for gait and balance therapy.  He feels his memory is good, MMSE today is normal.  He is agreeable to physical therapy, we discussed the various treatments done at [facility], at this point we have agreed to hold off on a re-trial of these, however if symptoms continue to progress, we will consider repeating an EMG/NCV study.  He expressed agreement and will follow-up in 6 months.  Thank you for allowing me to participate in the care of this patient. Please do not hesitate to call for any questions or concerns.   Ellouise Newer, M.D.  CC: Dr. Brigitte Pulse

## 2013-08-26 NOTE — Patient Instructions (Addendum)
1. Refer for Gait and balance therapy (foot drop, neuropathy) 2. Physical and brain stimulation exercises are important for brain health 3. Follow-up in 6 months

## 2013-09-02 ENCOUNTER — Encounter: Payer: Self-pay | Admitting: Neurology

## 2013-09-28 ENCOUNTER — Ambulatory Visit (HOSPITAL_COMMUNITY): Payer: Medicare Other | Attending: Cardiovascular Disease | Admitting: Cardiology

## 2013-09-28 DIAGNOSIS — I059 Rheumatic mitral valve disease, unspecified: Secondary | ICD-10-CM | POA: Insufficient documentation

## 2013-09-28 DIAGNOSIS — I4891 Unspecified atrial fibrillation: Secondary | ICD-10-CM | POA: Diagnosis not present

## 2013-09-28 DIAGNOSIS — I079 Rheumatic tricuspid valve disease, unspecified: Secondary | ICD-10-CM | POA: Diagnosis not present

## 2013-09-28 DIAGNOSIS — E785 Hyperlipidemia, unspecified: Secondary | ICD-10-CM | POA: Insufficient documentation

## 2013-09-28 DIAGNOSIS — I517 Cardiomegaly: Secondary | ICD-10-CM | POA: Diagnosis not present

## 2013-09-28 DIAGNOSIS — I340 Nonrheumatic mitral (valve) insufficiency: Secondary | ICD-10-CM

## 2013-09-28 NOTE — Progress Notes (Signed)
Echo performed. 

## 2013-09-29 DIAGNOSIS — Z7901 Long term (current) use of anticoagulants: Secondary | ICD-10-CM | POA: Diagnosis not present

## 2013-09-29 DIAGNOSIS — I4891 Unspecified atrial fibrillation: Secondary | ICD-10-CM | POA: Diagnosis not present

## 2013-10-02 DIAGNOSIS — R269 Unspecified abnormalities of gait and mobility: Secondary | ICD-10-CM | POA: Diagnosis not present

## 2013-10-02 DIAGNOSIS — R279 Unspecified lack of coordination: Secondary | ICD-10-CM | POA: Diagnosis not present

## 2013-10-05 DIAGNOSIS — R269 Unspecified abnormalities of gait and mobility: Secondary | ICD-10-CM | POA: Diagnosis not present

## 2013-10-05 DIAGNOSIS — R279 Unspecified lack of coordination: Secondary | ICD-10-CM | POA: Diagnosis not present

## 2013-10-06 ENCOUNTER — Encounter: Payer: Self-pay | Admitting: Cardiology

## 2013-10-06 ENCOUNTER — Ambulatory Visit (INDEPENDENT_AMBULATORY_CARE_PROVIDER_SITE_OTHER): Payer: Medicare Other | Admitting: Cardiology

## 2013-10-06 VITALS — BP 128/60 | HR 72 | Ht 71.0 in | Wt 177.4 lb

## 2013-10-06 DIAGNOSIS — I059 Rheumatic mitral valve disease, unspecified: Secondary | ICD-10-CM

## 2013-10-06 DIAGNOSIS — I4891 Unspecified atrial fibrillation: Secondary | ICD-10-CM | POA: Diagnosis not present

## 2013-10-06 DIAGNOSIS — G6181 Chronic inflammatory demyelinating polyneuritis: Secondary | ICD-10-CM | POA: Diagnosis not present

## 2013-10-06 DIAGNOSIS — Z7901 Long term (current) use of anticoagulants: Secondary | ICD-10-CM | POA: Diagnosis not present

## 2013-10-06 DIAGNOSIS — I341 Nonrheumatic mitral (valve) prolapse: Secondary | ICD-10-CM

## 2013-10-06 DIAGNOSIS — I482 Chronic atrial fibrillation, unspecified: Secondary | ICD-10-CM

## 2013-10-06 DIAGNOSIS — I340 Nonrheumatic mitral (valve) insufficiency: Secondary | ICD-10-CM

## 2013-10-06 NOTE — Progress Notes (Signed)
Rodney Malone Date of Birth: 07-27-1922 Medical Record #[id number]  History of Present Illness: Rodney Malone is seen for followup of mitral insufficiency.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 He has a history of chronic atrial fibrillation and has been on Coumadin. He has a remote history of stroke. His  physician in [place] had considered for him for MV surgery.  He is limited predominantly by his CIDP. His walking is very limited. He uses a walker. He denies any SOB or chest pain. His weight has been stable. He's had no edema. He denies any dizziness, palpitations, or syncope. He has no orthopnea or PND. He states he has fallen 3 times since he moved to [place].   Current Outpatient Prescriptions on File Prior to Visit  Medication Sig Dispense Refill  . alendronate (FOSAMAX) 70 MG tablet Take 70 mg by mouth every 7 (seven) days. Take with a full glass of water on an empty stomach.      . cetirizine (ZYRTEC) 10 MG tablet Take 10 mg by mouth as needed.       . metoprolol succinate (TOPROL XL) 100 MG 24 hr tablet Take 100 mg by mouth daily.       . pravastatin (PRAVACHOL) 20 MG tablet Take 20 mg by mouth daily.      . urea (CARMOL) 40 % CREA 40 %      . warfarin (COUMADIN) 2 MG tablet Take 1-2 mg by mouth daily. TAKE 1/2 TABLET ON WEDNESDAY       No current facility-administered medications on file prior to visit.    Allergies  Allergen Reactions  . Penicillins Other (See Comments)    UNKNOWN    Past Medical History  Diagnosis Date  . Hyperlipidemia   . Atrial fibrillation   . Osteopenia   . H/O: CVA (cerebrovascular accident)   . Unsteady gait   . Allergic rhinitis   . Nephrolithiasis   . Nevus, atypical   . Recurrent boils   . Hyperbilirubinemia   . Colonic polyp   . Mitral regurgitation   . CIDP (chronic inflammatory demyelinating polyneuropathy)   . Long term (current) use of anticoagulants     Past Surgical History  Procedure Laterality Date  .  Appendectomy      History  Smoking status  . Former Smoker  . Quit date: 10/06/1973  Smokeless tobacco  . Never Used    History  Alcohol Use  . Yes    Comment: usually have a couple of drinks a day    History reviewed. No pertinent family history.  Review of Systems: The review of systems is positive for memory loss. He has chronic pain and muscle wasting related to CIDP. All other systems were reviewed and are negative.  Physical Exam: BP 128/60  Pulse 72  Ht 5\' 11"  (1.803 m)  Wt 177 lb 6.4 oz (80.468 kg)  BMI 24.75 kg/m2 He is a pleasant, elderly white male in no acute distress. HEENT: Normal. Oropharynx is clear with good dental repair. Neck is supple without jugular venous distention, bruits, adenopathy, or thyromegaly. Lungs: Clear Cardiovascular: Irregular rate and rhythm. Normal S1 and S2. Grade 3/6 holosystolic murmur heard at the left sternal border radiating to the axilla. Abdomen: Soft and nontender. No hepatosplenomegaly or masses. No bruits. Extremities: he has braces on both legs. No edema. Patient has significant muscle atrophy in both legs with weakness. Skin: Warm and dry. Neuro: Patient has short-term memory loss. He is alert  and oriented x3. He walks with a walker.  LABORATORY DATA:   Echo:09/28/13  Study Conclusions  - Left ventricle: The cavity size was at the upper limits of normal. Systolic function was normal. The estimated ejection fraction was in the range of 60% to 65%. Wall motion was normal; there were no regional wall motion abnormalities. - Mitral valve: Moderate flail motion involving the middle scallop of the posterior leaflet due to rupture of one or more chords. There was severe regurgitation directed eccentrically and toward the septum. - Left atrium: The atrium was severely dilated. - Right atrium: The atrium was moderately dilated. - Atrial septum: No defect or patent foramen ovale was identified. - Pulmonary arteries:  Systolic pressure was moderately increased. PA peak pressure: 47 mm Hg (S).  Transthoracic echocardiography. M-mode, complete 2D, spectral Doppler, and color Doppler. Birthdate: [name]: 10-09-1922. Age: Patient is 80 yr old. Sex: Gender: male. Height: Height: 180.3 cm. Height: 71 in. Weight: Weight: 83 kg. Weight: 182.6 lb. Body mass index: BMI: 25.5 kg/m^2. Body surface area: BSA: 2.05 m^2. Blood pressure: 138/81 Patient status: Outpatient. Study date: Study date: 09/28/2013. Study time: 11:04 AM. Location: [place] [facility]  -------------------------------------------------------------------  ------------------------------------------------------------------- Left ventricle: The cavity size was at the upper limits of normal. Systolic function was normal. The estimated ejection fraction was in the range of 60% to 65%. Wall motion was normal; there were no regional wall motion abnormalities.  ------------------------------------------------------------------- Aortic valve: Trileaflet; mildly thickened, mildly calcified leaflets. Sclerosis without stenosis. Doppler: There was no significant regurgitation.  ------------------------------------------------------------------- Mitral valve: Mildly thickened leaflets . Moderate flail motion involving the middle scallop of the posterior leaflet due to rupture of one or more chords. Doppler: There was severe regurgitation directed eccentrically and toward the septum. Peak gradient (D): 5 mm Hg.  ------------------------------------------------------------------- Left atrium: The atrium was severely dilated.  ------------------------------------------------------------------- Atrial septum: No defect or patent foramen ovale was identified.  ------------------------------------------------------------------- Right ventricle: The cavity size was normal. Wall thickness was normal. Systolic function was  normal.  ------------------------------------------------------------------- Pulmonic valve: The valve appears to be grossly normal. Doppler: There was no significant regurgitation.  ------------------------------------------------------------------- Tricuspid valve: Structurally normal valve. Leaflet separation was normal. Doppler: Transvalvular velocity was within the normal range. There was mild regurgitation.  ------------------------------------------------------------------- Pulmonary artery: Systolic pressure was moderately increased.  ------------------------------------------------------------------- Right atrium: The atrium was moderately dilated.  ------------------------------------------------------------------- Pericardium: There was no pericardial effusion.  ------------------------------------------------------------------- Systemic veins: Inferior vena cava: The vessel was normal in size. The respirophasic diameter changes were in the normal range (= 50%), consistent with normal central venous pressure.  ------------------------------------------------------------------- Prepared and Electronically Authenticated by  Sanda Klein, MD 2015-07-20T12:32:54  ------------------------------------------------------------------- Measurements  Left ventricle Value 10/07/2012 Reference LV ID, ED, PLAX chordal (H) 56.6 mm 56 43 - 52 LV ID, ES, PLAX chordal (H) 39.8 mm 35.2 23 - 38 LV fx shortening, PLAX (N) 30 % 37 >=29 chordal LV PW thickness, ED 8.93 mm 11.7 --------- IVS/LV PW ratio, ED (N) 1.09 0.95 <=1.3 Stroke volume, 2D 49 ml ---------- --------- Stroke volume/bsa, 2D 24 ml/m^2 ---------- --------- LV end-diastolic volume, 188 ml ---------- --------- 1-p C1Y LV end-systolic volume, 55 ml ---------- --------- 1-p S0Y LV end-diastolic volume, 301 ml ---------- --------- 1-p S0F LV end-systolic volume, 67 ml ---------- --------- 1-p A4C LV ejection  fraction, 54 % ---------- --------- 1-p A4C Stroke volume, 1-p A4C 78 ml ---------- --------- LV end-diastolic 71 ml/m^2 ---------- --------- volume/bsa, 1-p U9N LV end-systolic 33 ml/m^2 ---------- --------- volume/bsa, 1-p  A4C Stroke volume/bsa, 1-p 38 ml/m^2 ---------- --------- N1Z LV end-diastolic volume, 001 ml ---------- --------- 2-p LV end-systolic volume, 63 ml ---------- --------- 2-p LV ejection fraction, 58 % ---------- --------- 2-p Stroke volume, 2-p 86 ml ---------- --------- LV end-diastolic 73 ml/m^2 ---------- --------- volume/bsa, 2-p LV end-systolic 31 ml/m^2 ---------- --------- volume/bsa, 2-p Stroke volume/bsa, 2-p 41.8 ml/m^2 ---------- ---------  Ventricular septum Value 10/07/2012 Reference IVS thickness, ED 9.7 mm 11.1 ---------  LVOT Value 10/07/2012 Reference LVOT ID, S 23 mm 23 --------- LVOT area 4.15 cm^2 4.15 --------- LVOT ID 23 mm 23 --------- LVOT VTI, S 11.87 cm 16.5 --------- Stroke volume (SV), LVOT 49.3 ml 68.6 --------- DP Stroke index (SV/bsa), 24.1 ml/m^2 33.8 --------- LVOT DP  Aorta Value 10/07/2012 Reference Aortic root ID, ED 36 mm 32 --------- Ascending aorta ID, A-P, 34 mm ---------- --------- S  Left atrium Value 10/07/2012 Reference LA ID, A-P, ES 52 mm 51 --------- LA ID/bsa, A-P (H) 2.54 cm/m^2 2.51 <=2.2  Mitral valve Value 10/07/2012 Reference Mitral E-wave peak 116 cm/s ---------- --------- velocity Mitral deceleration time (N) 204 ms ---------- 150 - 230 Mitral peak gradient, D 5 mm Hg ---------- ---------  Pulmonary arteries Value 10/07/2012 Reference PA pressure, S, DP (H) 47 mm Hg ---------- <=30  Tricuspid valve Value 10/07/2012 Reference Tricuspid regurg peak 314 cm/s 301 --------- velocity Tricuspid peak RV-RA 39 mm Hg 36 --------- gradient Tricuspid maximal regurg 314 cm/s ---------- --------- velocity, PISA  Systemic veins Value 10/07/2012 Reference Estimated CVP 8 mm Hg 5  ---------  Right ventricle Value 10/07/2012 Reference RV pressure, S, DP (H) 47 mm Hg 41 <=30  Legend: (L) and (H) mark values outside specified reference range.  (N) marks values inside specified reference range.      Assessment / Plan: 1. Severe Mitral insufficiency secondary to MV prolapse with partially flail posterior leaflet.   I would argue against mitral valve repair given his advanced age and multiple comorbidities. He is asymptomatic and has no evidence of congestive heart failure on exam. His LV function is preserved so his long-term prognosis is probably not significantly impacted by his mitral insufficiency. We will continue his medical therapy and repeat an Echo in one year. If he should develop symptoms or decline in LV function I would consider him for a Mitral clip procedure.  2. Atrial fibrillation. Rate is well controlled and he is on chronic anticoagulation.  3. CIDP.  4. Dementia.  5. Remote CVA.  6. Hyperlipidemia, well controlled on pravastatin.

## 2013-10-06 NOTE — Patient Instructions (Signed)
Continue your current therapy  I will see you in 6 months.   

## 2013-10-07 DIAGNOSIS — R279 Unspecified lack of coordination: Secondary | ICD-10-CM | POA: Diagnosis not present

## 2013-10-07 DIAGNOSIS — R269 Unspecified abnormalities of gait and mobility: Secondary | ICD-10-CM | POA: Diagnosis not present

## 2013-10-09 DIAGNOSIS — R269 Unspecified abnormalities of gait and mobility: Secondary | ICD-10-CM | POA: Diagnosis not present

## 2013-10-09 DIAGNOSIS — R279 Unspecified lack of coordination: Secondary | ICD-10-CM | POA: Diagnosis not present

## 2013-10-12 DIAGNOSIS — R269 Unspecified abnormalities of gait and mobility: Secondary | ICD-10-CM | POA: Diagnosis not present

## 2013-10-12 DIAGNOSIS — Z9181 History of falling: Secondary | ICD-10-CM | POA: Diagnosis not present

## 2013-10-12 DIAGNOSIS — R279 Unspecified lack of coordination: Secondary | ICD-10-CM | POA: Diagnosis not present

## 2013-10-12 DIAGNOSIS — G63 Polyneuropathy in diseases classified elsewhere: Secondary | ICD-10-CM | POA: Diagnosis not present

## 2013-10-23 NOTE — Telephone Encounter (Signed)
error 

## 2013-11-03 DIAGNOSIS — Z23 Encounter for immunization: Secondary | ICD-10-CM | POA: Diagnosis not present

## 2013-11-04 DIAGNOSIS — I4891 Unspecified atrial fibrillation: Secondary | ICD-10-CM | POA: Diagnosis not present

## 2013-11-04 DIAGNOSIS — Z7901 Long term (current) use of anticoagulants: Secondary | ICD-10-CM | POA: Diagnosis not present

## 2013-11-18 DIAGNOSIS — L570 Actinic keratosis: Secondary | ICD-10-CM | POA: Diagnosis not present

## 2013-11-18 DIAGNOSIS — Z85828 Personal history of other malignant neoplasm of skin: Secondary | ICD-10-CM | POA: Diagnosis not present

## 2013-11-18 DIAGNOSIS — L821 Other seborrheic keratosis: Secondary | ICD-10-CM | POA: Diagnosis not present

## 2013-11-18 DIAGNOSIS — D485 Neoplasm of uncertain behavior of skin: Secondary | ICD-10-CM | POA: Diagnosis not present

## 2013-11-18 DIAGNOSIS — L820 Inflamed seborrheic keratosis: Secondary | ICD-10-CM | POA: Diagnosis not present

## 2013-12-21 ENCOUNTER — Encounter: Payer: Self-pay | Admitting: Podiatry

## 2013-12-21 ENCOUNTER — Ambulatory Visit (INDEPENDENT_AMBULATORY_CARE_PROVIDER_SITE_OTHER): Payer: Medicare Other | Admitting: Podiatry

## 2013-12-21 DIAGNOSIS — B351 Tinea unguium: Secondary | ICD-10-CM

## 2013-12-21 DIAGNOSIS — M79676 Pain in unspecified toe(s): Secondary | ICD-10-CM

## 2013-12-22 NOTE — Progress Notes (Signed)
Patient ID: Rodney Malone, male   DOB: March 14, 1922, 80 y.o.   MRN: [id number]  Subjective: Orientated x3 patient presents complaining of painful toenails  Objective: Elongated, hypertrophic, discolored, incurvated toenails 6-10  Assessment: Symptomatic onychomycoses 6-10  Plan: Debrided toenails x10 without a bleeding  Reappoint x3 months

## 2013-12-23 DIAGNOSIS — Z7901 Long term (current) use of anticoagulants: Secondary | ICD-10-CM | POA: Diagnosis not present

## 2013-12-23 DIAGNOSIS — I480 Paroxysmal atrial fibrillation: Secondary | ICD-10-CM | POA: Diagnosis not present

## 2014-01-13 DIAGNOSIS — H3531 Nonexudative age-related macular degeneration: Secondary | ICD-10-CM | POA: Diagnosis not present

## 2014-01-13 DIAGNOSIS — Z961 Presence of intraocular lens: Secondary | ICD-10-CM | POA: Diagnosis not present

## 2014-01-13 DIAGNOSIS — H5203 Hypermetropia, bilateral: Secondary | ICD-10-CM | POA: Diagnosis not present

## 2014-01-13 DIAGNOSIS — H52203 Unspecified astigmatism, bilateral: Secondary | ICD-10-CM | POA: Diagnosis not present

## 2014-02-09 DIAGNOSIS — I340 Nonrheumatic mitral (valve) insufficiency: Secondary | ICD-10-CM | POA: Diagnosis not present

## 2014-02-09 DIAGNOSIS — Z0000 Encounter for general adult medical examination without abnormal findings: Secondary | ICD-10-CM | POA: Diagnosis not present

## 2014-02-09 DIAGNOSIS — N200 Calculus of kidney: Secondary | ICD-10-CM | POA: Diagnosis not present

## 2014-02-09 DIAGNOSIS — G6181 Chronic inflammatory demyelinating polyneuritis: Secondary | ICD-10-CM | POA: Diagnosis not present

## 2014-02-09 DIAGNOSIS — E785 Hyperlipidemia, unspecified: Secondary | ICD-10-CM | POA: Diagnosis not present

## 2014-02-09 DIAGNOSIS — M859 Disorder of bone density and structure, unspecified: Secondary | ICD-10-CM | POA: Diagnosis not present

## 2014-02-09 DIAGNOSIS — I639 Cerebral infarction, unspecified: Secondary | ICD-10-CM | POA: Diagnosis not present

## 2014-02-09 DIAGNOSIS — Z7901 Long term (current) use of anticoagulants: Secondary | ICD-10-CM | POA: Diagnosis not present

## 2014-02-09 DIAGNOSIS — F0390 Unspecified dementia without behavioral disturbance: Secondary | ICD-10-CM | POA: Diagnosis not present

## 2014-02-09 DIAGNOSIS — I480 Paroxysmal atrial fibrillation: Secondary | ICD-10-CM | POA: Diagnosis not present

## 2014-02-24 ENCOUNTER — Encounter: Payer: Self-pay | Admitting: Neurology

## 2014-02-24 ENCOUNTER — Ambulatory Visit (INDEPENDENT_AMBULATORY_CARE_PROVIDER_SITE_OTHER): Payer: Medicare Other | Admitting: Neurology

## 2014-02-24 VITALS — BP 120/76 | HR 85 | Resp 16 | Ht 71.0 in | Wt 176.0 lb

## 2014-02-24 DIAGNOSIS — G6181 Chronic inflammatory demyelinating polyneuritis: Secondary | ICD-10-CM

## 2014-02-24 DIAGNOSIS — R413 Other amnesia: Secondary | ICD-10-CM

## 2014-02-24 NOTE — Patient Instructions (Signed)
1. Schedule visit with Dr. Posey Pronto for evaluation of CIDP 2. Continue physical exercise and balance exercises 3. Continue brain stimulation exercises for brain health/memory 4. Follow-up in 6 months

## 2014-02-24 NOTE — Progress Notes (Signed)
NEUROLOGY FOLLOW UP OFFICE NOTE  Rodney Malone [id number]  HISTORY OF PRESENT ILLNESS: I had the pleasure of seeing Rodney Malone in follow-up in the neurology clinic on 02/24/2014.  The patient was last seen 6 months ago for second opinion for CIDP. He had reported that "nothing could be done at [facility]" however on review of records, he had tried multiple treatments including IVIG, plasmapharesis, and immunosuppression with Cellcept, with minimal benefit. He reports that his legs feel a little better but his balance and feet are not any better, but not any worse. He fell last May, no injuries. He continues to do exercises, including squats. He had also reported memory changes, MMSE on last visit was 28/30. He feels that his memory is not good, he forgets names so often. He was at a meeting in [place] yesterday and could not recall the names of half of the people there. He occasionally forgets to take his medications. He denies any headaches, dizziness, dysarthria, dysphagia, shortness of breath, ptosis, diplopia, neck pain. He has occasional numbness in his hands and feet.   HPI: This is a very pleasant 80 yo LH man with a history of atrial fibrillation on anticoagulation with Coumadin, right frontal stroke in 2004 with no residual deficits, hyperlipidemia, and chronic inflammatory demyelinating polyneuropathy (CIDP) diagnosed around 15 years ago. He recalls symptoms started after he stumbled and broke 2 toes on the left feet, followed by weakness in both feet and legs. He has been wearing AFOs for the past 10 years. He reports numbness and tingling in both feet up to his ankles. He also has numbness and tingling in both hands and has has 4 different surgeries for Dupuytren's contractures. He reports going to [facility] for several years but was told to "just stay in [place] because we can't do anything for you." The last time he did PT was 7 years ago.  Records from [facility] from 2004 to 2011  were reviewed. He was seeing neuromuscular specialist Dr. Roselyn Meier. 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 He was tried on multiple therapies, including IVIG, plasmapharesis, Cellcept, and steroids without major benefit. They had tried Lyrica and Cymbalta for the dysesthesias but had side effects of nausea. In 2009, he was noted to have a violaceous discoloration in both feet that appeared to be venostasis. The patient does not recall trying the different therapies.  He denies any diplopia, dysarthria, dysphagia. He has occasional mild headaches that resolve with Tylenol. He has very little dizziness when moving around and holds on to his walker. He has a tremor in both hands and notes some difficulty writing and holding a glass of water steady. 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 He does occasionally forget names and conversations, but denies getting lost driving, does not forget to pay bills or his medications.   Diagnostic Data: Head CT done 07/14/13 personally reviewed showing right frontal encephalomalacia, no acute findings.  PAST MEDICAL HISTORY: Past Medical History  Diagnosis Date  . Hyperlipidemia   . Atrial fibrillation   . Osteopenia   . H/O: CVA (cerebrovascular accident)   . Unsteady gait   . Allergic rhinitis   . Nephrolithiasis   . Nevus, atypical   . Recurrent boils   . Hyperbilirubinemia   . Colonic polyp   . Mitral regurgitation   . CIDP (chronic inflammatory demyelinating polyneuropathy)   .  Long term (current) use of anticoagulants     MEDICATIONS: Current Outpatient Prescriptions on File Prior to Visit  Medication Sig Dispense Refill  . alendronate (FOSAMAX) 70 MG tablet Take 70 mg by mouth every 7 (seven) days. Take with a full glass of water on an empty stomach.    . cetirizine (ZYRTEC) 10  MG tablet Take 10 mg by mouth as needed.     . metoprolol succinate (TOPROL XL) 100 MG 24 hr tablet Take 100 mg by mouth daily.     . pravastatin (PRAVACHOL) 20 MG tablet Take 20 mg by mouth daily.    Marland Kitchen warfarin (COUMADIN) 2 MG tablet Take 1-2 mg by mouth daily. TAKE 1/2 TABLET ON WEDNESDAY    . urea (CARMOL) 40 % CREA 40 %     No current facility-administered medications on file prior to visit.    ALLERGIES: Allergies  Allergen Reactions  . Penicillins Other (See Comments)    UNKNOWN    FAMILY HISTORY: No family history on file.  SOCIAL HISTORY: History   Social History  . Marital Status: Married    Spouse Name: N/A    Number of Children: 2  . Years of Education: N/A   Occupational History  . broadcasting    Social History Main Topics  . Smoking status: Former Smoker    Quit date: 10/06/1973  . Smokeless tobacco: Never Used  . Alcohol Use: Yes     Comment: usually have a couple of drinks a day  . Drug Use: No  . Sexual Activity: Not on file   Other Topics Concern  . Not on file   Social History Narrative   Patient is Married(Rodney Malone). Retired. Lives in  single level home, Independent Living  section at [facility] since 2009.   Former smoker, minimal Alcohol history   Patient has Advanced planning documents: Living Will             REVIEW OF SYSTEMS: Constitutional: No fevers, chills, or sweats, no generalized fatigue, change in appetite Eyes: No visual changes, double vision, eye pain Ear, nose and throat: No hearing loss, ear pain, nasal congestion, sore throat Cardiovascular: No chest pain, palpitations Respiratory:  No shortness of breath at rest or with exertion, wheezes GastrointestinaI: No nausea, vomiting, diarrhea, abdominal pain, fecal incontinence Genitourinary:  No dysuria, urinary retention or frequency Musculoskeletal:  No neck pain, back pain Integumentary: No rash, pruritus, skin lesions Neurological: as  above Psychiatric: No depression, insomnia, anxiety Endocrine: No palpitations, fatigue, diaphoresis, mood swings, change in appetite, change in weight, increased thirst Hematologic/Lymphatic:  No anemia, purpura, petechiae. Allergic/Immunologic: no itchy/runny eyes, nasal congestion, recent allergic reactions, rashes  PHYSICAL EXAM: Filed Vitals:   02/24/14 1124  BP: 120/76  Pulse: 85  Resp: 16   General: No acute distress Head: Normocephalic/atraumatic Eyes: Fundoscopic exam shows bilateral sharp discs, no vessel changes, exudates, or hemorrhages Neck: supple, no paraspinal tenderness, full range of motion Back: No paraspinal tenderness Heart: regular rate and rhythm Lungs: Clear to auscultation bilaterally. Vascular: No carotid bruits. Skin/Extremities: uses AFOs on both feet, violaceous discoloration in both feet up to ankles (unchanged from prior), intact bipedal pulses Neurological Exam: Mental status: alert and oriented to person, place, and time, no dysarthria or aphasia, Fund of knowledge is appropriate. Recent and remote memory are intact. Attention and concentration are normal. Able to name objects and repeat phrases. MMSE - Mini Mental State Exam 02/24/2014  Orientation to time 5  Orientation to Place 5  Registration 3  Attention/ Calculation 5  Recall 3  Language- name 2 objects 2  Language- repeat 1  Language- follow 3 step command 3  Language- read & follow direction 1  Write a sentence 1  Copy design 1  Total score 30   Cranial nerves: CN I: not tested CN II: pupils equal, round and reactive to light, visual fields intact, fundi unremarkable. CN III, IV, VI: full range of motion, no nystagmus, no ptosis CN V: facial sensation intact CN VII: upper and lower face symmetric CN VIII: hearing intact to finger rub CN IX, X: gag intact, uvula midline CN XI: sternocleidomastoid and trapezius muscles intact CN XII: tongue midline Bulk & Tone: significant  atrophy in both lower legs and feet. He has contractures on the right 3rd and 5th digits, left 2nd and 5th digits. no fasciculations. Motor: 5/5 on both UE, 4/5 bilateral interossei with note of contractures as above. He has 5/5 strength on bilateral hip flexors, extensors, knee flexion/extension, 0/5 bilateral dorsiflexion and toe extension, 3/5 bilateral eversion, 3+/5 bilateral inversion, 3+/5 plantarflexion (similar to prior visit) Sensation: decreased to cold on both feet, right>left; decreased pin on right foot to ankle, decreased vibration up to ankle on right, up to knee on left (similar to prior). Romberg test positive Deep Tendon Reflexes: +2 on right UE, +1 left UE, absent on both LE. no ankle clonus Plantar responses: downgoing bilaterally Cerebellar: no incoordination on finger to nose testing Gait: ambulates with walker and AFOs in both feet, slow and cautious Tremor: none today  IMPRESSION: This is a very pleasant 80 yo LH man with a history of atrial fibrillation on Coumadin, right frontal stroke with no residual deficits, hyperlipidemia, and probable CIDP with bilateral foot drop. He reports that "nothing could be done at [facility]," however on review of records he has tried multiple therapies including IVIG, plasmapharesis, and immunosuppression with Cellcept, with minimal benefit.He feels the poor balance has been unchanged but expressed frustration that nothing could be done. He will be referred to our neuromuscular specialist Dr. Posey Pronto for any further suggestions she may have regarding the CIDP. We discussed memory issues, MMSE today is 30/30, better than on last visit. No indication to start cholinesterase inhibitors at this time. We discussed the importance of physical exercise and brain stimulation exercises for brain health. He continues to do his own exercises for the CIDP. We discussed fall precautions. He will follow-up in 6 months.  Thank you for allowing me to participate in  his care.  Please do not hesitate to call for any questions or concerns.  The duration of this appointment visit was 15 minutes of face-to-face time with the patient.  Greater than 50% of this time was spent in counseling, explanation of diagnosis, planning of further management, and coordination of care.   Ellouise Newer, M.D.   CC: Dr. Brigitte Pulse

## 2014-03-10 DIAGNOSIS — Z7901 Long term (current) use of anticoagulants: Secondary | ICD-10-CM | POA: Diagnosis not present

## 2014-03-10 DIAGNOSIS — I480 Paroxysmal atrial fibrillation: Secondary | ICD-10-CM | POA: Diagnosis not present

## 2014-03-29 ENCOUNTER — Ambulatory Visit (INDEPENDENT_AMBULATORY_CARE_PROVIDER_SITE_OTHER): Payer: Medicare Other | Admitting: Podiatry

## 2014-03-29 ENCOUNTER — Encounter: Payer: Self-pay | Admitting: Podiatry

## 2014-03-29 DIAGNOSIS — M79676 Pain in unspecified toe(s): Secondary | ICD-10-CM

## 2014-03-29 DIAGNOSIS — B351 Tinea unguium: Secondary | ICD-10-CM | POA: Diagnosis not present

## 2014-03-29 NOTE — Progress Notes (Signed)
Patient ID: Rodney Malone, male   DOB: 1923/03/08, 81 y.o.   MRN: [id number] Subjective: This patient presents again complaining of uncomfortable toenails and walking wearing shoes  Objective: Orientated 3 The toenails are elongated, hypertrophic, brittle, discolored and tender to palpation 6-10  Assessment: Symptomatic onychomycoses 6-10  Plan: Debridement of toenails 10 without a bleeding  Reappoint 3 months

## 2014-04-07 ENCOUNTER — Ambulatory Visit (INDEPENDENT_AMBULATORY_CARE_PROVIDER_SITE_OTHER): Payer: Medicare Other | Admitting: Cardiology

## 2014-04-07 ENCOUNTER — Encounter: Payer: Self-pay | Admitting: Cardiology

## 2014-04-07 VITALS — BP 120/90 | HR 93 | Ht 72.0 in | Wt 175.6 lb

## 2014-04-07 DIAGNOSIS — Z7901 Long term (current) use of anticoagulants: Secondary | ICD-10-CM | POA: Diagnosis not present

## 2014-04-07 DIAGNOSIS — I482 Chronic atrial fibrillation, unspecified: Secondary | ICD-10-CM

## 2014-04-07 DIAGNOSIS — I341 Nonrheumatic mitral (valve) prolapse: Secondary | ICD-10-CM

## 2014-04-07 DIAGNOSIS — I340 Nonrheumatic mitral (valve) insufficiency: Secondary | ICD-10-CM

## 2014-04-07 NOTE — Patient Instructions (Signed)
Continue your current therapy  I will see you in 6 months.   

## 2014-04-08 NOTE — Progress Notes (Signed)
Rodney Malone Date of Birth: Apr 25, 1922 Medical Record #[id number]  History of Present Illness: Rodney Malone is seen for followup of mitral insufficiency. 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 He has a history of chronic atrial fibrillation and has been on Coumadin. He has a remote history of stroke. He is limited predominantly by his CIDP. His walking is very limited. He uses a walker. He denies any SOB or chest pain. His weight has been stable. He's had no edema. He denies any dizziness, palpitations, or syncope. He has no orthopnea or PND. He does report that he had a bad nosebleed one week ago after cleaning his nose but he was able to get it stopped.   Current Outpatient Prescriptions on File Prior to Visit  Medication Sig Dispense Refill  . alendronate (FOSAMAX) 70 MG tablet Take 70 mg by mouth every 7 (seven) days. Take with a full glass of water on an empty stomach.    . cetirizine (ZYRTEC) 10 MG tablet Take 10 mg by mouth as needed.     . metoprolol succinate (TOPROL XL) 100 MG 24 hr tablet Take 100 mg by mouth daily.     . pravastatin (PRAVACHOL) 20 MG tablet Take 20 mg by mouth daily.    . urea (CARMOL) 40 % CREA 40 %    . warfarin (COUMADIN) 2 MG tablet Take 1-2 mg by mouth daily. TAKE 1/2 TABLET  3 times a week and 1 tablet 4 times a week.     No current facility-administered medications on file prior to visit.    Allergies  Allergen Reactions  . Penicillins Other (See Comments)    UNKNOWN    Past Medical History  Diagnosis Date  . Hyperlipidemia   . Atrial fibrillation   . Osteopenia   . H/O: CVA (cerebrovascular accident)   . Unsteady gait   . Allergic rhinitis   . Nephrolithiasis   . Nevus, atypical   . Recurrent boils   . Hyperbilirubinemia   . Colonic polyp   . Mitral regurgitation   . CIDP (chronic inflammatory demyelinating polyneuropathy)   . Long term (current) use of anticoagulants     Past Surgical History  Procedure Laterality Date    . Appendectomy      History  Smoking status  . Former Smoker  . Quit date: 10/06/1973  Smokeless tobacco  . Never Used    History  Alcohol Use  . Yes    Comment: usually have a couple of drinks a day    History reviewed. No pertinent family history.  Review of Systems: The review of systems is positive for imbalance. He has chronic pain and muscle wasting related to CIDP. All other systems were reviewed and are negative.  Physical Exam: BP 120/90 mmHg  Pulse 93  Ht 6' (1.829 m)  Wt 175 lb 9.6 oz (79.652 kg)  BMI 23.81 kg/m2 He is a pleasant, elderly white male in no acute distress. HEENT: Normal. Oropharynx is clear with good dental repair. Neck is supple without jugular venous distention, bruits, adenopathy, or thyromegaly. Lungs: Clear Cardiovascular: Irregular rate and rhythm. Normal S1 and S2. Grade 3/6 holosystolic murmur heard at the left sternal border radiating to the axilla. Abdomen: Soft and nontender. No hepatosplenomegaly or masses. No bruits. Extremities: he has braces on both legs. No edema. Patient has significant muscle atrophy in both legs with weakness. Skin: Warm and dry. Neuro: Patient has short-term memory loss. He is alert and oriented  x3. He walks with a walker.  LABORATORY DATA:   Echo:09/28/13  Study Conclusions  - Left ventricle: The cavity size was at the upper limits of normal. Systolic function was normal. The estimated ejection fraction was in the range of 60% to 65%. Wall motion was normal; there were no regional wall motion abnormalities. - Mitral valve: Moderate flail motion involving the middle scallop of the posterior leaflet due to rupture of one or more chords. There was severe regurgitation directed eccentrically and toward the septum. - Left atrium: The atrium was severely dilated. - Right atrium: The atrium was moderately dilated. - Atrial septum: No defect or patent foramen ovale was identified. - Pulmonary arteries:  Systolic pressure was moderately increased. PA peak pressure: 47 mm Hg (S).  Transthoracic echocardiography. M-mode, complete 2D, spectral Doppler, and color Doppler. Birthdate: [name]: January 13, 1923. Age: Patient is 81 yr old. Sex: Gender: male. Height: Height: 180.3 cm. Height: 71 in. Weight: Weight: 83 kg. Weight: 182.6 lb. Body mass index: BMI: 25.5 kg/m^2. Body surface area: BSA: 2.05 m^2. Blood pressure: 138/81 Patient status: Outpatient. Study date: Study date: 09/28/2013. Study time: 11:04 AM. Location: [place] [facility]  -------------------------------------------------------------------  ------------------------------------------------------------------- Left ventricle: The cavity size was at the upper limits of normal. Systolic function was normal. The estimated ejection fraction was in the range of 60% to 65%. Wall motion was normal; there were no regional wall motion abnormalities.  ------------------------------------------------------------------- Aortic valve: Trileaflet; mildly thickened, mildly calcified leaflets. Sclerosis without stenosis. Doppler: There was no significant regurgitation.  ------------------------------------------------------------------- Mitral valve: Mildly thickened leaflets . Moderate flail motion involving the middle scallop of the posterior leaflet due to rupture of one or more chords. Doppler: There was severe regurgitation directed eccentrically and toward the septum. Peak gradient (D): 5 mm Hg.  ------------------------------------------------------------------- Left atrium: The atrium was severely dilated.  ------------------------------------------------------------------- Atrial septum: No defect or patent foramen ovale was identified.  ------------------------------------------------------------------- Right ventricle: The cavity size was normal. Wall thickness was normal. Systolic function was  normal.  ------------------------------------------------------------------- Pulmonic valve: The valve appears to be grossly normal. Doppler: There was no significant regurgitation.  ------------------------------------------------------------------- Tricuspid valve: Structurally normal valve. Leaflet separation was normal. Doppler: Transvalvular velocity was within the normal range. There was mild regurgitation.  ------------------------------------------------------------------- Pulmonary artery: Systolic pressure was moderately increased.  ------------------------------------------------------------------- Right atrium: The atrium was moderately dilated.  ------------------------------------------------------------------- Pericardium: There was no pericardial effusion.  ------------------------------------------------------------------- Systemic veins: Inferior vena cava: The vessel was normal in size. The respirophasic diameter changes were in the normal range (= 50%), consistent with normal central venous pressure.  ------------------------------------------------------------------- Prepared and Electronically Authenticated by  Sanda Klein, MD 2015-07-20T12:32:54  ------------------------------------------------------------------- Measurements  Left ventricle Value 10/07/2012 Reference LV ID, ED, PLAX chordal (H) 56.6 mm 56 43 - 52 LV ID, ES, PLAX chordal (H) 39.8 mm 35.2 23 - 38 LV fx shortening, PLAX (N) 30 % 37 >=29 chordal LV PW thickness, ED 8.93 mm 11.7 --------- IVS/LV PW ratio, ED (N) 1.09 0.95 <=1.3 Stroke volume, 2D 49 ml ---------- --------- Stroke volume/bsa, 2D 24 ml/m^2 ---------- --------- LV end-diastolic volume, 062 ml ---------- --------- 1-p I9S LV end-systolic volume, 55 ml ---------- --------- 1-p W5I LV end-diastolic volume, 627 ml ---------- --------- 1-p O3J LV end-systolic volume, 67 ml ---------- --------- 1-p A4C LV ejection  fraction, 54 % ---------- --------- 1-p A4C Stroke volume, 1-p A4C 78 ml ---------- --------- LV end-diastolic 71 ml/m^2 ---------- --------- volume/bsa, 1-p K0X LV end-systolic 33 ml/m^2 ---------- --------- volume/bsa, 1-p A4C Stroke  volume/bsa, 1-p 38 ml/m^2 ---------- --------- U6J LV end-diastolic volume, 335 ml ---------- --------- 2-p LV end-systolic volume, 63 ml ---------- --------- 2-p LV ejection fraction, 58 % ---------- --------- 2-p Stroke volume, 2-p 86 ml ---------- --------- LV end-diastolic 73 ml/m^2 ---------- --------- volume/bsa, 2-p LV end-systolic 31 ml/m^2 ---------- --------- volume/bsa, 2-p Stroke volume/bsa, 2-p 41.8 ml/m^2 ---------- ---------  Ventricular septum Value 10/07/2012 Reference IVS thickness, ED 9.7 mm 11.1 ---------  LVOT Value 10/07/2012 Reference LVOT ID, S 23 mm 23 --------- LVOT area 4.15 cm^2 4.15 --------- LVOT ID 23 mm 23 --------- LVOT VTI, S 11.87 cm 16.5 --------- Stroke volume (SV), LVOT 49.3 ml 68.6 --------- DP Stroke index (SV/bsa), 24.1 ml/m^2 33.8 --------- LVOT DP  Aorta Value 10/07/2012 Reference Aortic root ID, ED 36 mm 32 --------- Ascending aorta ID, A-P, 34 mm ---------- --------- S  Left atrium Value 10/07/2012 Reference LA ID, A-P, ES 52 mm 51 --------- LA ID/bsa, A-P (H) 2.54 cm/m^2 2.51 <=2.2  Mitral valve Value 10/07/2012 Reference Mitral E-wave peak 116 cm/s ---------- --------- velocity Mitral deceleration time (N) 204 ms ---------- 150 - 230 Mitral peak gradient, D 5 mm Hg ---------- ---------  Pulmonary arteries Value 10/07/2012 Reference PA pressure, S, DP (H) 47 mm Hg ---------- <=30  Tricuspid valve Value 10/07/2012 Reference Tricuspid regurg peak 314 cm/s 301 --------- velocity Tricuspid peak RV-RA 39 mm Hg 36 --------- gradient Tricuspid maximal regurg 314 cm/s ---------- --------- velocity, PISA  Systemic veins Value 10/07/2012 Reference Estimated CVP 8 mm Hg 5  ---------  Right ventricle Value 10/07/2012 Reference RV pressure, S, DP (H) 47 mm Hg 41 <=30  Legend: (L) and (H) mark values outside specified reference range.  (N) marks values inside specified reference range.      Assessment / Plan: 1. Severe Mitral insufficiency secondary to MV prolapse with partially flail posterior leaflet.   I would argue against mitral valve repair or Mitral clip given his advanced age and multiple comorbidities. He is asymptomatic and has no evidence of congestive heart failure on exam. His LV function is preserved so his long-term prognosis is probably not significantly impacted by his mitral insufficiency. We will continue his medical therapy and repeat an Echo next summer. I will see him in 6 months.  2. Atrial fibrillation. Rate is well controlled and he is on chronic anticoagulation. Discussed ways to prevent nosebleeds.   3. CIDP.  4. Dementia.  5. Remote CVA.  6. Hyperlipidemia, well controlled on pravastatin.

## 2014-04-14 DIAGNOSIS — Z7901 Long term (current) use of anticoagulants: Secondary | ICD-10-CM | POA: Diagnosis not present

## 2014-04-14 DIAGNOSIS — I480 Paroxysmal atrial fibrillation: Secondary | ICD-10-CM | POA: Diagnosis not present

## 2014-04-16 ENCOUNTER — Ambulatory Visit: Payer: Medicare Other | Admitting: Neurology

## 2014-04-30 ENCOUNTER — Encounter: Payer: Self-pay | Admitting: Neurology

## 2014-04-30 ENCOUNTER — Ambulatory Visit (INDEPENDENT_AMBULATORY_CARE_PROVIDER_SITE_OTHER): Payer: Medicare Other | Admitting: Neurology

## 2014-04-30 VITALS — BP 120/68 | HR 72 | Ht 72.0 in | Wt 179.0 lb

## 2014-04-30 DIAGNOSIS — G6181 Chronic inflammatory demyelinating polyneuritis: Secondary | ICD-10-CM | POA: Diagnosis not present

## 2014-04-30 DIAGNOSIS — G8220 Paraplegia, unspecified: Secondary | ICD-10-CM | POA: Diagnosis not present

## 2014-04-30 NOTE — Progress Notes (Signed)
Rodney Malone   Date: 04/30/2014   Rodney Malone MRN: [id number] DOB: 11-14-1922   Dear Dr. Delice Malone:  Thank you for your kind referral of Rodney Malone for consultation of CIDP. Although his history is well known to you, please allow [place] to reiterate it for the purpose of our medical record. The patient was accompanied to the clinic by wife who also provides collateral information.     History of Present Illness: Rodney Malone is a 81 y.o. 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  Symptoms started in 2004, when he tripped over his toes and broke his left 2 toes which slowly progressed to involve bilateral feet and leg weakness and paresthesias.  He initially was getting care in [place] and then transferred care to [facility], where he was a long time patient from 2004 - 2011 under the case of Dr. Roselyn Malone.  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  Unclear whether he had CSF testing or not.  Because of the degree of his foot drop and progressive symptoms, patient had treatment with IVIG, plasmapheresis, steroids, and Cellcept without any benefit. Over the past 10 years, he reports that his paresthesias have transitioned into constant numbness and there has been no progression of weakness, in fact, he feels that symptoms have stabilized, but he is frustrated that there is nothing to "fix" his problem.  Of note, started in 2009, he also developed a violaceous discoloration to his feet.  He continues to have falls about once per month mostly due to imbalance, which is  concerning since he is on coumadin.  He walks with bilateral AFOs and a walker.  He is still driving and does not have any difficulty controlling the pedals and denies being involved in any MVAs.     Past Medical History  Diagnosis Date  . Hyperlipidemia   . Atrial fibrillation   . Osteopenia   . H/O: CVA (cerebrovascular accident)   . Unsteady gait   . Allergic rhinitis   . Nephrolithiasis   . Nevus, atypical   . Recurrent boils   . Hyperbilirubinemia   . Colonic polyp   . Mitral regurgitation   . CIDP (chronic inflammatory demyelinating polyneuropathy)   . Long term (current) use of anticoagulants     Past Surgical History  Procedure Laterality Date  . Appendectomy       Medications:  Current Outpatient Prescriptions on File Prior to Malone  Medication Sig Dispense Refill  . alendronate (FOSAMAX) 70 MG tablet Take 70 mg by mouth every 7 (seven) days. Take with a full glass of water on an empty stomach.    . cetirizine (ZYRTEC) 10 MG tablet Take 10 mg by mouth as needed.     . metoprolol succinate (TOPROL XL) 100 MG 24 hr tablet Take 100 mg by mouth daily.     . pravastatin (PRAVACHOL) 20 MG tablet Take 20 mg by mouth daily.    . urea (CARMOL) 40 % CREA 40 %    . warfarin (COUMADIN) 2 MG tablet Take 1-2 mg by mouth daily. TAKE 1/2 TABLET  3 times a week and 1 tablet 4 times a week.     No current  facility-administered medications on file prior to Malone.    Allergies:  Allergies  Allergen Reactions  . Penicillins Other (See Comments)    UNKNOWN    Family History: No family history on file.  Social History: History   Social History  . Marital Status: Married    Spouse Name: N/A  . Number of Children: 2  . Years of Education: N/A   Occupational History  . broadcasting    Social History Main Topics  . Smoking status: Former Smoker    Quit date: 10/06/1973  . Smokeless tobacco: Never Used  . Alcohol Use: Yes     Comment: usually have a couple of drinks a  day  . Drug Use: No  . Sexual Activity: Not on file   Other Topics Concern  . Not on file   Social History Narrative   Patient is Married(Rodney Malone). Retired. Lives in  single level home, Independent Living  section at [place] since 2009.   Former smoker, minimal Alcohol history   Patient has Advanced planning documents: Living Will             Review of Systems:  CONSTITUTIONAL: No fevers, chills, night sweats, or weight loss.   EYES: No visual changes or eye pain ENT: No hearing changes.  No history of nose bleeds.   RESPIRATORY: No cough, wheezing and shortness of breath.   CARDIOVASCULAR: Negative for chest pain, and palpitations.   GI: Negative for abdominal discomfort, blood in stools or black stools.  No recent change in bowel habits.   GU:  No history of incontinence.   MUSCLOSKELETAL: No history of joint pain or swelling.  No myalgias.   SKIN: + lesions, rash, and itching.   HEMATOLOGY/ONCOLOGY: Negative for prolonged bleeding, bruising easily, and swollen nodes.    ENDOCRINE: Negative for cold or heat intolerance, polydipsia or goiter.   PSYCH:  No depression or anxiety symptoms.   NEURO: As Above.   Vital Signs:  BP 120/68 mmHg  Pulse 72  Ht 6' (1.829 m)  Wt 179 lb (81.194 kg)  BMI 24.27 kg/m2   General Medical Exam:   General:  Well appearing, comfortable.   Eyes/ENT: see cranial nerve examination.   Neck: No masses appreciated.  Full range of motion without tenderness.  No carotid bruits. Respiratory:  Clear to auscultation, good air entry bilaterally.   Cardiac:  Regular rate and rhythm, no murmur.   Extremities:  Marked atrophy of the lower legs and feet with bilateral foot drop.  Dupuytren's contractures on the hands Skin: Extensive violaceous discoloration of the feet.  Neurological Exam: MENTAL STATUS including orientation to time, place, person, recent and remote memory, attention span and concentration, language, and fund of  knowledge is normal.  Speech is not dysarthric.  CRANIAL NERVES: II:  No visual field defects.    III-IV-VI: Pupils equal round and reactive to light.  Normal conjugate, extra-ocular eye movements in all directions of gaze.  No nystagmus.  No ptosis.   V:  Normal facial sensation.   VII:  Normal facial symmetry and movements.   VIII:  Normal hearing and vestibular function.   IX-X:  Normal palatal movement.   XI:  Normal shoulder shrug and head rotation.   XII:  Normal tongue strength and range of motion, no deviation or fasciculation.  MOTOR:  Severe atrophy of the muscles of the lower extremities with barely any muscle present, there is also loss of surround soft tissue with visible underlying boney structures.  No fasciculations or abnormal movements.  No pronator drift.  Tone is reduced in the legs.    Right Upper Extremity:    Left Upper Extremity:    Deltoid  5/5   Deltoid  5/5   Biceps  5/5   Biceps  5/5   Triceps  5/5   Triceps  5/5   Wrist extensors  5/5   Wrist extensors  5/5   Wrist flexors  5/5   Wrist flexors  5/5   Finger extensors  4+/5   Finger extensors  4+/5   Finger flexors  4+/5   Finger flexors  4+/5   Dorsal interossei  4+/5   Dorsal interossei  4/5   Abductor pollicis  5/5   Abductor pollicis  5/5   Tone (Ashworth scale)  0  Tone (Ashworth scale)  0   Right Lower Extremity:    Left Lower Extremity:    Hip flexors  5/5   Hip flexors  5/5   Hip extensors  5/5   Hip extensors  5/5   Knee flexors  5-/5   Knee flexors  5-/5   Knee extensors  5-/5   Knee extensors  5-/5   Dorsiflexors  0/5   Dorsiflexors  0/5   Plantarflexors  0/5   Plantarflexors  0/5   Toe extensors  0/5   Toe extensors  0/5   Toe flexors  0/5   Toe flexors  0/5   Tone (Ashworth scale)  0  Tone (Ashworth scale)  0   MSRs:  Right                                                                 Left brachioradialis 2+  brachioradialis 2+  biceps 2+  biceps 2+  triceps 2+  triceps 2+  patellar  0  Patellar 0  ankle jerk 0  ankle jerk 0  Hoffman no  Hoffman no  plantar response mute  plantar response mute   SENSORY:  Vibration reduced at knees bilaterally ~40% and absent distal to ankles.  Pin prick, temperature, and light touch is diminished below the knees.  COORDINATION/GAIT: Severe bilateral foot drop with high steppage gait   IMPRESSION: Mr. Vossler is a delightful 81 year-old gentleman presenting today with his wife of 40 years for second opinion regarding CIDP.  The majority of today's Malone was spent discussed the diagnosis of CIDP, how it relates to him, and what prognosis he has.   We discussed the pathogenesis, etiology, management, and natural course of neuropathy. In his case, he has already had IVIG, PLEX, steroids, and Cellcept with no improvement when his symptoms were active.  He has been clinically stable for 10+ years, suggesting that the disease is in remission and hopefully he will not have a relapse.  Based on the severity of his neuropathy, there is probably very little demyelinating neuropathy and it has all become axon loss so there really is no nerves left to salvage in the legs.  If he starts to develop new weakness of the proximal legs, arms, or new paresthesias, the diagnosis can be revisited.  However, at this time, since there is no progression of symptoms, there is no utility to trying other therapies or doing additional work-up.  I reassured  him that since symptoms have been stable for so long, his neuropathy should not get worse.  Things to consider going forward include driving safety and fall risk especially on coumadin.  I would not recommend him to be driving because of sensory loss associated with neuropathy.   Further, because he is high fall risk we had a long talk about the risks of being on anticoagulation and potential for a devastating fall, which they are aware of and it seems this has been reiterated many times, but it is a difficult situation.  I  did recommend that if balance becomes worse and he chooses to stay on coumadin, he should using a wheelchair or motorized scooter to minimize fall risk.   If I can be of any further assistance, patient is welcome to see me as needed; otherwise, he will follow-up with Dr. Delice Malone.   The duration of this appointment Malone was 60 minutes of face-to-face time with the patient.  Greater than 50% of this time was spent in counseling, explanation of diagnosis, planning of further management, and coordination of care.   Thank you for allowing me to participate in patient's care.  If I can answer any additional questions, I would be pleased to do so.    Sincerely,    Donika K. Posey Pronto, DO

## 2014-04-30 NOTE — Patient Instructions (Addendum)
It was great to see you and your wife today.  Thank you for your patience with me explaining the diagnosis of CIDP and how it relates to you.  Based on the severity of symptoms, the nerves to your feet and legs will not recover so there is no good treatment available for you.  Fortunately, your symptoms have not worsened over the years, which is encouraging that the disease is not still attacking the nerves.  Things to consider going forward include driving safety and fall risk especially on coumadin.  I would not recommend you to be driving because of your neuropathy.  Please discuss the risks and benefits with your cardiologist about being on coumadin.    If your balance becomes worse and you choose to stay on coumadin, please consider using a wheelchair or motorized scooter to minimize your fall risk.   Feel free to call my office, if I can be of any further assistance.  You are scheduled to follow-up with Dr. Lucio Edward in June.

## 2014-05-19 DIAGNOSIS — Z7901 Long term (current) use of anticoagulants: Secondary | ICD-10-CM | POA: Diagnosis not present

## 2014-05-19 DIAGNOSIS — I480 Paroxysmal atrial fibrillation: Secondary | ICD-10-CM | POA: Diagnosis not present

## 2014-06-23 DIAGNOSIS — I480 Paroxysmal atrial fibrillation: Secondary | ICD-10-CM | POA: Diagnosis not present

## 2014-06-23 DIAGNOSIS — Z7901 Long term (current) use of anticoagulants: Secondary | ICD-10-CM | POA: Diagnosis not present

## 2014-06-28 ENCOUNTER — Ambulatory Visit (INDEPENDENT_AMBULATORY_CARE_PROVIDER_SITE_OTHER): Payer: Medicare Other | Admitting: Podiatry

## 2014-06-28 ENCOUNTER — Encounter: Payer: Self-pay | Admitting: Podiatry

## 2014-06-28 DIAGNOSIS — M79676 Pain in unspecified toe(s): Secondary | ICD-10-CM

## 2014-06-28 DIAGNOSIS — B351 Tinea unguium: Secondary | ICD-10-CM | POA: Diagnosis not present

## 2014-06-29 NOTE — Progress Notes (Signed)
Patient ID: Rodney Malone, male   DOB: Apr 01, 1922, 81 y.o.   MRN: [id number]  Subjective: This patient presents complaining of painful toenails and requests toenail debridement  Objective: The toenails are hypertrophic, elongated, brittle and tender to direct palpation 6-10  Assessment: Symptomatic onychomycoses 6-10  Plan: Debridement toenails 10 without any bleeding  Reappoint 3 month

## 2014-07-29 DIAGNOSIS — I480 Paroxysmal atrial fibrillation: Secondary | ICD-10-CM | POA: Diagnosis not present

## 2014-07-29 DIAGNOSIS — Z7901 Long term (current) use of anticoagulants: Secondary | ICD-10-CM | POA: Diagnosis not present

## 2014-08-13 DIAGNOSIS — G6181 Chronic inflammatory demyelinating polyneuritis: Secondary | ICD-10-CM | POA: Diagnosis not present

## 2014-08-13 DIAGNOSIS — W19XXXA Unspecified fall, initial encounter: Secondary | ICD-10-CM | POA: Diagnosis not present

## 2014-08-13 DIAGNOSIS — Z6823 Body mass index (BMI) 23.0-23.9, adult: Secondary | ICD-10-CM | POA: Diagnosis not present

## 2014-08-13 DIAGNOSIS — I480 Paroxysmal atrial fibrillation: Secondary | ICD-10-CM | POA: Diagnosis not present

## 2014-08-13 DIAGNOSIS — M8580 Other specified disorders of bone density and structure, unspecified site: Secondary | ICD-10-CM | POA: Diagnosis not present

## 2014-08-13 DIAGNOSIS — I340 Nonrheumatic mitral (valve) insufficiency: Secondary | ICD-10-CM | POA: Diagnosis not present

## 2014-08-13 DIAGNOSIS — I639 Cerebral infarction, unspecified: Secondary | ICD-10-CM | POA: Diagnosis not present

## 2014-08-13 DIAGNOSIS — Z7901 Long term (current) use of anticoagulants: Secondary | ICD-10-CM | POA: Diagnosis not present

## 2014-08-26 DIAGNOSIS — Z7901 Long term (current) use of anticoagulants: Secondary | ICD-10-CM | POA: Diagnosis not present

## 2014-08-26 DIAGNOSIS — I480 Paroxysmal atrial fibrillation: Secondary | ICD-10-CM | POA: Diagnosis not present

## 2014-08-26 DIAGNOSIS — I639 Cerebral infarction, unspecified: Secondary | ICD-10-CM | POA: Diagnosis not present

## 2014-08-30 ENCOUNTER — Ambulatory Visit (INDEPENDENT_AMBULATORY_CARE_PROVIDER_SITE_OTHER): Payer: Medicare Other | Admitting: Neurology

## 2014-08-30 ENCOUNTER — Encounter: Payer: Self-pay | Admitting: Neurology

## 2014-08-30 VITALS — BP 110/68 | HR 84 | Resp 16 | Ht 71.0 in | Wt 175.0 lb

## 2014-08-30 DIAGNOSIS — R413 Other amnesia: Secondary | ICD-10-CM

## 2014-08-30 DIAGNOSIS — G6181 Chronic inflammatory demyelinating polyneuritis: Secondary | ICD-10-CM

## 2014-08-30 NOTE — Progress Notes (Signed)
NEUROLOGY FOLLOW UP OFFICE NOTE  Rodney Malone [id number]  HISTORY OF PRESENT ILLNESS: I had the pleasure of seeing Rodney Malone in follow-up in the neurology clinic on 08/30/2014.  The patient was last seen 6 months ago for CIDP and memory loss. Records and images were personally reviewed where available.  Since his last visit, he has been evaluated by our neuromuscular specialist Dr. Posey Malone for CIDP. Rodney Malone tells me today that "no one knows what it is" and that he has been to [facility] and told that nothing could be done. Per Dr. Serita Grit note, "he has been clinically stable for 10+ years, suggesting that the disease in in remission. Based on the severity of his neuropathy, there is probably very little demyelinating neuropathy and it has all become axon loss so there is really no nerves left to salvage in the legs." Since there is no progression of symptoms, there is no utility to trying other therapies or doing additional work-up. Dr. Posey Malone expressed concern about driving and fall risk while on Coumadin. Rodney Malone tells me that after his visit, his children have gotten involved and he has stopped driving. He reports having a fall last month. 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 MMSE in December 2015 was 30/30. He feels his handwriting is worse. He mainly cannot run the business because of his mobility. He uses his walker at all times. He denies any headaches. He has occasional dizziness, he has occasional numbness in his hands and feet. No dysarthria/dysphagia, neck pain, bowel/bladder dysfunction. He denies any weakness in his upper extremities or proximal lower extremities, but does have difficulty with steps. No steps at home.  HPI: This is a very pleasant 81 yo LH man with a history of atrial fibrillation on anticoagulation with Coumadin, right frontal stroke in 2004 with no residual deficits, hyperlipidemia, and chronic inflammatory  demyelinating polyneuropathy (CIDP) diagnosed around 15 years ago. He recalls symptoms started after he stumbled and broke 2 toes on the left feet, followed by weakness in both feet and legs. He has been wearing AFOs for the past 10 years. He reports numbness and tingling in both feet up to his ankles. He also has numbness and tingling in both hands and has has 4 different surgeries for Dupuytren's contractures. He reports going to [facility] for several years but was told to "just stay in [place] because we can't do anything for you." The last time he did PT was 7 years ago.  Records from [facility] from 2004 to 2011 were reviewed. He was seeing neuromuscular specialist Dr. Roselyn Meier. 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 He was tried on multiple therapies, including IVIG, plasmapharesis, Cellcept, and steroids without major benefit. They had tried Lyrica and Cymbalta for the dysesthesias but had side effects of nausea. In 2009, he was noted to have a violaceous discoloration in both feet that appeared to be venostasis. The patient does not recall trying the different therapies.  Diagnostic Data: Head CT done 07/14/13 personally reviewed showing right frontal encephalomalacia, no acute findings.  PAST MEDICAL HISTORY: Past Medical History  Diagnosis Date  . Hyperlipidemia   . Atrial fibrillation   . Osteopenia   . H/O: CVA (cerebrovascular accident)   . Unsteady gait   . Allergic rhinitis   . Nephrolithiasis   . Nevus, atypical   .  Recurrent boils   . Hyperbilirubinemia   . Colonic polyp   . Mitral regurgitation   . CIDP (chronic inflammatory demyelinating polyneuropathy)   . Long term (current) use of anticoagulants     MEDICATIONS: Current Outpatient Prescriptions on File Prior to Visit  Medication Sig Dispense Refill  . alendronate (FOSAMAX) 70 MG tablet Take  70 mg by mouth every 7 (seven) days. Take with a full glass of water on an empty stomach.    . cetirizine (ZYRTEC) 10 MG tablet Take 10 mg by mouth daily.     . ciclopirox (LOPROX) 0.77 % cream Apply topically 2 (two) times daily.    . metoprolol succinate (TOPROL XL) 100 MG 24 hr tablet Take 100 mg by mouth daily.     . pravastatin (PRAVACHOL) 20 MG tablet Take 20 mg by mouth daily.    . urea (CARMOL) 40 % CREA 40 %    . warfarin (COUMADIN) 2 MG tablet Take 2 mg by mouth daily. TAKE 1/2 TABLET  3 times a week and 1 tablet 4 times a week.     No current facility-administered medications on file prior to visit.    ALLERGIES: Allergies  Allergen Reactions  . Penicillins Other (See Comments)    UNKNOWN    FAMILY HISTORY: No family history on file.  SOCIAL HISTORY: History   Social History  . Marital Status: Married    Spouse Name: N/A  . Number of Children: 2  . Years of Education: N/A   Occupational History  . broadcasting    Social History Main Topics  . Smoking status: Former Smoker    Quit date: 10/06/1973  . Smokeless tobacco: Never Used  . Alcohol Use: Yes     Comment: usually have a couple of drinks a day  . Drug Use: No  . Sexual Activity: Not on file   Other Topics Concern  . Not on file   Social History Narrative   Patient is Married([name]). Retired. Lives in  single level home, Independent Living  section at [place] since 2009.   Former smoker, minimal Alcohol history   Patient has Advanced planning documents: Living Will             REVIEW OF SYSTEMS: Constitutional: No fevers, chills, or sweats, no generalized fatigue, change in appetite Eyes: No visual changes, double vision, eye pain Ear, nose and throat: No hearing loss, ear pain, nasal congestion, sore throat Cardiovascular: No chest pain, palpitations Respiratory:  No shortness of breath at rest or with exertion, wheezes GastrointestinaI: No nausea, vomiting, diarrhea,  abdominal pain, fecal incontinence Genitourinary:  No dysuria, urinary retention or frequency Musculoskeletal:  No neck pain,+ back pain Integumentary: No rash, pruritus, skin lesions Neurological: as above Psychiatric: No depression, insomnia, anxiety Endocrine: No palpitations, fatigue, diaphoresis, mood swings, change in appetite, change in weight, increased thirst Hematologic/Lymphatic:  No anemia, purpura, petechiae. Allergic/Immunologic: no itchy/runny eyes, nasal congestion, recent allergic reactions, rashes  PHYSICAL EXAM: Filed Vitals:   08/30/14 1131  BP: 110/68  Pulse: 84  Resp: 16   General: No acute distress Head:  Normocephalic/atraumatic Neck: supple, no paraspinal tenderness, full range of motion Heart:  Regular rate and rhythm Lungs:  Clear to auscultation bilaterally Back: No paraspinal tenderness Skin/Extremities: No rash, no edema Neurological Exam: alert and oriented to person, place, and time. No aphasia or dysarthria. Fund of knowledge is appropriate.  Recent and remote memory are intact.  Attention and concentration are normal.  Able to name objects and repeat phrases.  MMSE - Mini Mental State Exam 08/30/2014 02/24/2014  Orientation to time 4 5  Orientation to Place 5 5  Registration 3 3  Attention/ Calculation 5 5  Recall 3 3  Language- name 2 objects 2 2  Language- repeat 1 1  Language- follow 3 step command 2 3  Language- read & follow direction 1 1  Write a sentence 1 1  Copy design 1 1  Total score 28 30   Cranial nerves: Pupils equal, round, reactive to light.  Fundoscopic exam unremarkable, no papilledema. Extraocular movements intact with no nystagmus. Visual fields full. Facial sensation intact. No facial asymmetry. Tongue, uvula, palate midline.  Motor: Bulk and tone normal, muscle strength 5/5 on both UE with contractures in both hands, 5/5 proximal bilateral LE (hip flexors, extensors, abduction/adduction), 5/5 bilateral knee  flexion/extension, 0/5 bilateral dorsiflexion, plantarflexion, toe extension. No pronator drift.  Sensation decreased in both LE.  Deep tendon reflexes 2+ on both UE, unable to elicit reflexes in both LE. Finger to nose testing intact.  Gait with walker slow and cautious with AFOs bilaterally  IMPRESSION: This is a very pleasant 81 yo LH man with a history of atrial fibrillation on Coumadin, right frontal stroke with no residual deficits, hyperlipidemia, and CIDP with bilateral foot drop. He has been to [facility] and has been evaluated by our neuromuscular specialist Dr. Posey Malone as well for the CIDP, at this point he has been clinically stable and is likely in remission. He tells me today that "no one knows what is it." I clarified with him his visit with Dr. Posey Malone, as well as that at this point there is no utility to trying other therapies, unless he starts having new weakness in the proximal legs, arms, or new paresthesias. He denies any change in symptoms except his balance is worsening. I discussed PT for balance therapy, he would like to think about this first. Dr. Posey Malone had discussed driving with him, he has stopped driving. She had also discussed talking to his cardiologist about risk and benefits of anticoagulation since he is a fall risk with the neuropathy. He fell last week. Continue to closely monitor.   We also discussed his memory, he feels it is slightly worse, this was noted as well during today's visit. His MMSE remains normal at 28/30, however he is less sharp as compared to previous visits. We discussed the option of starting cholinesterase inhibitors such as Aricept, he would like to hold off for now. We discussed the importance of physical exercise and brain stimulation exercises for brain health. He will follow-up in 6 months and knows to call us for any changes.   Thank you for allowing me to participate in his care.  Please do not hesitate to call for any questions or concerns.  The  duration of this appointment visit was 15 minutes of face-to-face time with the patient.  Greater than 50% of this time was spent in counseling, explanation of diagnosis, planning of further management, and coordination of care.   Ellouise Newer, M.D.   CC: Dr. Brigitte Pulse

## 2014-08-30 NOTE — Patient Instructions (Addendum)
1. It is very important to continue balance and strengthening exercises 2. Physical exercise and brain stimulation exercises are also helpful for memory and brain health 3. Again monitor fall risks, please discuss risks and benefits of being on Coumadin with your cardiologist 4. Use walker at all times, if balance worsens and you continue on Coumadin, please consider using a motorized scooter to minimize fall risk 5. Follow-up in 6 months.

## 2014-09-23 ENCOUNTER — Encounter: Payer: Self-pay | Admitting: Cardiology

## 2014-09-23 ENCOUNTER — Ambulatory Visit (INDEPENDENT_AMBULATORY_CARE_PROVIDER_SITE_OTHER): Payer: Medicare Other | Admitting: Cardiology

## 2014-09-23 VITALS — BP 116/64 | HR 91 | Ht 71.0 in | Wt 173.8 lb

## 2014-09-23 DIAGNOSIS — I482 Chronic atrial fibrillation, unspecified: Secondary | ICD-10-CM

## 2014-09-23 DIAGNOSIS — Z7901 Long term (current) use of anticoagulants: Secondary | ICD-10-CM | POA: Diagnosis not present

## 2014-09-23 DIAGNOSIS — E785 Hyperlipidemia, unspecified: Secondary | ICD-10-CM | POA: Diagnosis not present

## 2014-09-23 NOTE — Patient Instructions (Addendum)
Continue your current therapy  I will see you in one year   

## 2014-09-23 NOTE — Progress Notes (Signed)
Rodney Malone Date of Birth: 12/18/1922 Medical Record #[id number]  History of Present Illness: Mr. Rodney Malone is seen for followup of mitral insufficiency and atrial fibrillation. 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 He has a history of chronic atrial fibrillation and has been on Coumadin. He has a remote history of stroke. He is limited predominantly by his Neurologic issues. His walking is very limited. He uses a walker. He denies any SOB or chest pain. His weight has been stable. He's had mild edema. He denies any dizziness, palpitations, or syncope. He has no orthopnea or PND. He has had 3 falls since his last visit- he just loses his balance. No significant injury. No bleeding problems.   Current Outpatient Prescriptions on File Prior to Visit  Medication Sig Dispense Refill  . alendronate (FOSAMAX) 70 MG tablet Take 70 mg by mouth every 7 (seven) days. Take with a full glass of water on an empty stomach.    . cetirizine (ZYRTEC) 10 MG tablet Take 10 mg by mouth daily.     . ciclopirox (LOPROX) 0.77 % cream Apply topically 2 (two) times daily.    . metoprolol succinate (TOPROL XL) 100 MG 24 hr tablet Take 100 mg by mouth daily.     . pravastatin (PRAVACHOL) 20 MG tablet Take 20 mg by mouth daily.    . urea (CARMOL) 40 % CREA 40 %    . warfarin (COUMADIN) 2 MG tablet Take 2 mg by mouth daily. TAKE 1/2 TABLET  3 times a week and 1 tablet 4 times a week.     No current facility-administered medications on file prior to visit.    Allergies  Allergen Reactions  . Penicillins Other (See Comments)    UNKNOWN    Past Medical History  Diagnosis Date  . Hyperlipidemia   . Atrial fibrillation   . Osteopenia   . H/O: CVA (cerebrovascular accident)   . Unsteady gait   . Allergic rhinitis   . Nephrolithiasis   . Nevus, atypical   . Recurrent boils   . Hyperbilirubinemia   . Colonic polyp   . Mitral regurgitation   . CIDP (chronic inflammatory demyelinating polyneuropathy)    . Long term (current) use of anticoagulants     Past Surgical History  Procedure Laterality Date  . Appendectomy      History  Smoking status  . Former Smoker  . Quit date: 10/06/1973  Smokeless tobacco  . Never Used    History  Alcohol Use  . Yes    Comment: usually have a couple of drinks a day    History reviewed. No pertinent family history.  Review of Systems: The review of systems is as noted in HPI. All other systems were reviewed and are negative.  Physical Exam: BP 116/64 mmHg  Pulse 91  Ht 5\' 11"  (1.803 m)  Wt 78.835 kg (173 lb 12.8 oz)  BMI 24.25 kg/m2 He is a pleasant, elderly white male in no acute distress. HEENT: Normal.  Lungs: Clear Cardiovascular: Irregular rate and rhythm. Normal S1 and S2. Grade 0-2/6 holosystolic murmur heard at the left sternal border radiating to the axilla. Abdomen: Soft and nontender. No hepatosplenomegaly or masses. No bruits. Extremities: he has braces on both legs. Trace edema. Patient has significant muscle atrophy in both legs with weakness. Skin: Warm and dry. Neuro: Patient has short-term memory loss. He is alert and oriented x3. He walks with a walker.  LABORATORY DATA:   Ecg  today shows atrial fibrillation with rate 91 bpm. Prolonged QTc  487 msec. Otherwise normal. I have personally reviewed and interpreted this study.    Assessment / Plan: 1. Severe Mitral insufficiency secondary to MV prolapse with partially flail posterior leaflet.   Last Echo 7/15. I would argue against mitral valve repair or Mitral clip given his advanced age and multiple comorbidities. He is asymptomatic and has no evidence of congestive heart failure on exam. His LV function is preserved so his long-term prognosis is probably not significantly impacted by his mitral insufficiency. We will continue his medical therapy and follow up in one year.  2. Atrial fibrillation. Rate is well controlled and he is on chronic anticoagulation. Although  he has experienced some falls the benefit of anticoagulation still outweighs the risk of bleeding complications.   3. CIDP.  4. Dementia.  5. Remote CVA.  6. Hyperlipidemia, well controlled on pravastatin.

## 2014-10-06 ENCOUNTER — Ambulatory Visit: Payer: Medicare Other | Admitting: Podiatry

## 2014-10-14 DIAGNOSIS — I480 Paroxysmal atrial fibrillation: Secondary | ICD-10-CM | POA: Diagnosis not present

## 2014-10-14 DIAGNOSIS — Z7901 Long term (current) use of anticoagulants: Secondary | ICD-10-CM | POA: Diagnosis not present

## 2014-10-26 ENCOUNTER — Ambulatory Visit (INDEPENDENT_AMBULATORY_CARE_PROVIDER_SITE_OTHER): Payer: Medicare Other | Admitting: Podiatry

## 2014-10-26 ENCOUNTER — Encounter: Payer: Self-pay | Admitting: Podiatry

## 2014-10-26 DIAGNOSIS — M79676 Pain in unspecified toe(s): Secondary | ICD-10-CM | POA: Diagnosis not present

## 2014-10-26 DIAGNOSIS — B351 Tinea unguium: Secondary | ICD-10-CM | POA: Diagnosis not present

## 2014-10-26 NOTE — Progress Notes (Signed)
Patient ID: Rodney Malone, male   DOB: [birth year], 81 y.o.   MRN: [id number]  Subjective: This patient presents again for a scheduled visit complaining of painful toenails and walking wearing shoes and request toenail debridement  Objective: Orientated 3 The toenails are brittle, discolored, incurvated, hypertrophic and tender direct palpation 6-10  Assessment: Symptomatic onychomycoses 6-10  Plan: Debridement toenails 10 mechanically and electrically without any bleeding  Reappoint 3 months

## 2014-10-28 DIAGNOSIS — H5315 Visual distortions of shape and size: Secondary | ICD-10-CM | POA: Diagnosis not present

## 2014-10-28 DIAGNOSIS — H3531 Nonexudative age-related macular degeneration: Secondary | ICD-10-CM | POA: Diagnosis not present

## 2014-10-28 DIAGNOSIS — Z961 Presence of intraocular lens: Secondary | ICD-10-CM | POA: Diagnosis not present

## 2014-11-11 DIAGNOSIS — Z23 Encounter for immunization: Secondary | ICD-10-CM | POA: Diagnosis not present

## 2014-11-24 DIAGNOSIS — Z85828 Personal history of other malignant neoplasm of skin: Secondary | ICD-10-CM | POA: Diagnosis not present

## 2014-11-24 DIAGNOSIS — L570 Actinic keratosis: Secondary | ICD-10-CM | POA: Diagnosis not present

## 2014-11-24 DIAGNOSIS — K130 Diseases of lips: Secondary | ICD-10-CM | POA: Diagnosis not present

## 2014-11-24 DIAGNOSIS — B353 Tinea pedis: Secondary | ICD-10-CM | POA: Diagnosis not present

## 2014-11-24 DIAGNOSIS — B351 Tinea unguium: Secondary | ICD-10-CM | POA: Diagnosis not present

## 2014-11-24 DIAGNOSIS — L218 Other seborrheic dermatitis: Secondary | ICD-10-CM | POA: Diagnosis not present

## 2014-11-30 DIAGNOSIS — I480 Paroxysmal atrial fibrillation: Secondary | ICD-10-CM | POA: Diagnosis not present

## 2014-11-30 DIAGNOSIS — Z7901 Long term (current) use of anticoagulants: Secondary | ICD-10-CM | POA: Diagnosis not present

## 2015-01-04 DIAGNOSIS — I480 Paroxysmal atrial fibrillation: Secondary | ICD-10-CM | POA: Diagnosis not present

## 2015-01-04 DIAGNOSIS — Z7901 Long term (current) use of anticoagulants: Secondary | ICD-10-CM | POA: Diagnosis not present

## 2015-02-09 ENCOUNTER — Encounter: Payer: Self-pay | Admitting: Podiatry

## 2015-02-09 ENCOUNTER — Ambulatory Visit (INDEPENDENT_AMBULATORY_CARE_PROVIDER_SITE_OTHER): Payer: Medicare Other | Admitting: Podiatry

## 2015-02-09 DIAGNOSIS — B351 Tinea unguium: Secondary | ICD-10-CM | POA: Diagnosis not present

## 2015-02-09 DIAGNOSIS — M79676 Pain in unspecified toe(s): Secondary | ICD-10-CM | POA: Diagnosis not present

## 2015-02-09 NOTE — Patient Instructions (Signed)
Remove Bandage on the rt 4th and 5th toe and apply antibiotic ointment until scab falls off.

## 2015-02-10 NOTE — Progress Notes (Signed)
Patient ID: Rodney Malone, male   DOB: 1923/01/02, 81 y.o.   MRN: [id number]  Subjective: This patient presents again complaining of thick, elongated toenails with direct shoe pressure and walking and is requesting toenail debridement  Objective: Orientated 3 The toenails are elongated, brittle, discolored, hypertrophic and tender direct palpation 6-10  Assessment: Symptomatic onychomycoses 6-10  Plan: Debridement toenails 10 mechanically and electrically. Slight pinpoint bleeding distal fourth and fifth right toes today with topical antibiotic ointment and Band-Aids Patient instructed to apply topical antibiotic ointment to the fourth fifth right toes until a scab forms and cover with Band-Aids  Reappoint 3 months

## 2015-02-14 DIAGNOSIS — Z6823 Body mass index (BMI) 23.0-23.9, adult: Secondary | ICD-10-CM | POA: Diagnosis not present

## 2015-02-14 DIAGNOSIS — E784 Other hyperlipidemia: Secondary | ICD-10-CM | POA: Diagnosis not present

## 2015-02-14 DIAGNOSIS — R8290 Unspecified abnormal findings in urine: Secondary | ICD-10-CM | POA: Diagnosis not present

## 2015-02-14 DIAGNOSIS — M859 Disorder of bone density and structure, unspecified: Secondary | ICD-10-CM | POA: Diagnosis not present

## 2015-02-14 DIAGNOSIS — I480 Paroxysmal atrial fibrillation: Secondary | ICD-10-CM | POA: Diagnosis not present

## 2015-02-14 DIAGNOSIS — Z7901 Long term (current) use of anticoagulants: Secondary | ICD-10-CM | POA: Diagnosis not present

## 2015-02-14 DIAGNOSIS — G3184 Mild cognitive impairment, so stated: Secondary | ICD-10-CM | POA: Diagnosis not present

## 2015-02-14 DIAGNOSIS — G6181 Chronic inflammatory demyelinating polyneuritis: Secondary | ICD-10-CM | POA: Diagnosis not present

## 2015-02-14 DIAGNOSIS — M109 Gout, unspecified: Secondary | ICD-10-CM | POA: Diagnosis not present

## 2015-02-14 DIAGNOSIS — Z1389 Encounter for screening for other disorder: Secondary | ICD-10-CM | POA: Diagnosis not present

## 2015-02-14 DIAGNOSIS — N200 Calculus of kidney: Secondary | ICD-10-CM | POA: Diagnosis not present

## 2015-02-19 ENCOUNTER — Other Ambulatory Visit: Payer: Self-pay

## 2015-02-19 DIAGNOSIS — S0180XA Unspecified open wound of other part of head, initial encounter: Secondary | ICD-10-CM | POA: Diagnosis not present

## 2015-02-19 DIAGNOSIS — T148 Other injury of unspecified body region: Secondary | ICD-10-CM | POA: Diagnosis not present

## 2015-02-20 ENCOUNTER — Emergency Department (HOSPITAL_COMMUNITY): Payer: Medicare Other

## 2015-02-20 ENCOUNTER — Other Ambulatory Visit: Payer: Self-pay

## 2015-02-20 ENCOUNTER — Emergency Department (HOSPITAL_COMMUNITY)
Admission: EM | Admit: 2015-02-20 | Discharge: 2015-02-20 | Disposition: A | Discharge status: Home / Self Care | Payer: Medicare Other | Attending: Emergency Medicine | Admitting: Emergency Medicine

## 2015-02-20 ENCOUNTER — Encounter (HOSPITAL_COMMUNITY): Payer: Self-pay | Admitting: Emergency Medicine

## 2015-02-20 DIAGNOSIS — Z8673 Personal history of transient ischemic attack (TIA), and cerebral infarction without residual deficits: Secondary | ICD-10-CM | POA: Insufficient documentation

## 2015-02-20 DIAGNOSIS — Z79899 Other long term (current) drug therapy: Secondary | ICD-10-CM | POA: Insufficient documentation

## 2015-02-20 DIAGNOSIS — Z880 Allergy status to penicillin: Secondary | ICD-10-CM | POA: Insufficient documentation

## 2015-02-20 DIAGNOSIS — Y92022 Bathroom in mobile home as the place of occurrence of the external cause: Secondary | ICD-10-CM | POA: Insufficient documentation

## 2015-02-20 DIAGNOSIS — S4991XA Unspecified injury of right shoulder and upper arm, initial encounter: Secondary | ICD-10-CM | POA: Insufficient documentation

## 2015-02-20 DIAGNOSIS — S0003XA Contusion of scalp, initial encounter: Secondary | ICD-10-CM | POA: Diagnosis not present

## 2015-02-20 DIAGNOSIS — N390 Urinary tract infection, site not specified: Secondary | ICD-10-CM | POA: Diagnosis not present

## 2015-02-20 DIAGNOSIS — E785 Hyperlipidemia, unspecified: Secondary | ICD-10-CM | POA: Diagnosis not present

## 2015-02-20 DIAGNOSIS — T07XXXA Unspecified multiple injuries, initial encounter: Secondary | ICD-10-CM

## 2015-02-20 DIAGNOSIS — S0181XA Laceration without foreign body of other part of head, initial encounter: Secondary | ICD-10-CM | POA: Diagnosis not present

## 2015-02-20 DIAGNOSIS — Y998 Other external cause status: Secondary | ICD-10-CM | POA: Diagnosis not present

## 2015-02-20 DIAGNOSIS — S0083XA Contusion of other part of head, initial encounter: Secondary | ICD-10-CM | POA: Diagnosis not present

## 2015-02-20 DIAGNOSIS — Z23 Encounter for immunization: Secondary | ICD-10-CM | POA: Diagnosis not present

## 2015-02-20 DIAGNOSIS — W01198A Fall on same level from slipping, tripping and stumbling with subsequent striking against other object, initial encounter: Secondary | ICD-10-CM | POA: Diagnosis not present

## 2015-02-20 DIAGNOSIS — Y9389 Activity, other specified: Secondary | ICD-10-CM | POA: Diagnosis not present

## 2015-02-20 DIAGNOSIS — S098XXA Other specified injuries of head, initial encounter: Secondary | ICD-10-CM | POA: Diagnosis not present

## 2015-02-20 DIAGNOSIS — M25511 Pain in right shoulder: Secondary | ICD-10-CM | POA: Diagnosis not present

## 2015-02-20 DIAGNOSIS — Z7901 Long term (current) use of anticoagulants: Secondary | ICD-10-CM | POA: Insufficient documentation

## 2015-02-20 DIAGNOSIS — R0781 Pleurodynia: Secondary | ICD-10-CM | POA: Diagnosis not present

## 2015-02-20 DIAGNOSIS — Z8669 Personal history of other diseases of the nervous system and sense organs: Secondary | ICD-10-CM | POA: Diagnosis not present

## 2015-02-20 DIAGNOSIS — Z87891 Personal history of nicotine dependence: Secondary | ICD-10-CM | POA: Diagnosis not present

## 2015-02-20 DIAGNOSIS — R42 Dizziness and giddiness: Secondary | ICD-10-CM | POA: Diagnosis not present

## 2015-02-20 DIAGNOSIS — I4891 Unspecified atrial fibrillation: Secondary | ICD-10-CM | POA: Insufficient documentation

## 2015-02-20 DIAGNOSIS — R319 Hematuria, unspecified: Secondary | ICD-10-CM

## 2015-02-20 DIAGNOSIS — Z8739 Personal history of other diseases of the musculoskeletal system and connective tissue: Secondary | ICD-10-CM | POA: Insufficient documentation

## 2015-02-20 DIAGNOSIS — Z86010 Personal history of colonic polyps: Secondary | ICD-10-CM | POA: Diagnosis not present

## 2015-02-20 DIAGNOSIS — S0990XA Unspecified injury of head, initial encounter: Secondary | ICD-10-CM

## 2015-02-20 LAB — I-STAT TROPONIN, ED: Troponin i, poc: 0.02 ng/mL (ref 0.00–0.08)

## 2015-02-20 LAB — CBC WITH DIFFERENTIAL/PLATELET
BASOS ABS: 0 10*3/uL (ref 0.0–0.1)
BASOS PCT: 0 %
Eosinophils Absolute: 0.2 10*3/uL (ref 0.0–0.7)
Eosinophils Relative: 2 %
HEMATOCRIT: 44.2 % (ref 39.0–52.0)
HEMOGLOBIN: 14.5 g/dL (ref 13.0–17.0)
LYMPHS PCT: 14 %
Lymphs Abs: 1.7 10*3/uL (ref 0.7–4.0)
MCH: 31.5 pg (ref 26.0–34.0)
MCHC: 32.8 g/dL (ref 30.0–36.0)
MCV: 95.9 fL (ref 78.0–100.0)
Monocytes Absolute: 1.7 10*3/uL — ABNORMAL HIGH (ref 0.1–1.0)
Monocytes Relative: 14 %
NEUTROS ABS: 8.6 10*3/uL — AB (ref 1.7–7.7)
NEUTROS PCT: 70 %
Platelets: 170 10*3/uL (ref 150–400)
RBC: 4.61 MIL/uL (ref 4.22–5.81)
RDW: 13.7 % (ref 11.5–15.5)
WBC: 12.3 10*3/uL — ABNORMAL HIGH (ref 4.0–10.5)

## 2015-02-20 LAB — URINALYSIS, ROUTINE W REFLEX MICROSCOPIC
BILIRUBIN URINE: NEGATIVE
GLUCOSE, UA: NEGATIVE mg/dL
Ketones, ur: NEGATIVE mg/dL
NITRITE: NEGATIVE
PH: 5 (ref 5.0–8.0)
Protein, ur: NEGATIVE mg/dL
SPECIFIC GRAVITY, URINE: 1.015 (ref 1.005–1.030)

## 2015-02-20 LAB — URINE MICROSCOPIC-ADD ON

## 2015-02-20 LAB — PROTIME-INR
INR: 2.63 — ABNORMAL HIGH (ref 0.00–1.49)
Prothrombin Time: 27.7 seconds — ABNORMAL HIGH (ref 11.6–15.2)

## 2015-02-20 LAB — BASIC METABOLIC PANEL
ANION GAP: 8 (ref 5–15)
BUN: 13 mg/dL (ref 6–20)
CALCIUM: 10 mg/dL (ref 8.9–10.3)
CHLORIDE: 107 mmol/L (ref 101–111)
CO2: 24 mmol/L (ref 22–32)
Creatinine, Ser: 0.77 mg/dL (ref 0.61–1.24)
GFR calc non Af Amer: >60 mL/min (ref >60)
GLUCOSE: 103 mg/dL — AB (ref 65–99)
POTASSIUM: 4.3 mmol/L (ref 3.5–5.1)
Sodium: 139 mmol/L (ref 135–145)

## 2015-02-20 MED ORDER — ACETAMINOPHEN 500 MG PO TABS
1000.0000 mg | ORAL_TABLET | Freq: Once | ORAL | Status: AC
  Administered 2015-02-20: 1000 mg via ORAL
  Filled 2015-02-20: qty 2

## 2015-02-20 MED ORDER — BACITRACIN ZINC 500 UNIT/GM EX OINT
TOPICAL_OINTMENT | Freq: Once | CUTANEOUS | Status: AC
  Administered 2015-02-20: 1 via TOPICAL
  Filled 2015-02-20: qty 0.9

## 2015-02-20 MED ORDER — CEPHALEXIN 500 MG PO CAPS: 500.0000 mg | ORAL_CAPSULE | Freq: Four times a day (QID) | ORAL | Status: DC

## 2015-02-20 MED ORDER — TETANUS-DIPHTH-ACELL PERTUSSIS 5-2.5-18.5 LF-MCG/0.5 IM SUSP
0.5000 mL | Freq: Once | INTRAMUSCULAR | Status: AC
  Administered 2015-02-20: 0.5 mL via INTRAMUSCULAR
  Filled 2015-02-20: qty 0.5

## 2015-02-20 MED ORDER — DEXTROSE 5 % IV SOLN
1.0000 g | Freq: Once | INTRAVENOUS | Status: AC
  Administered 2015-02-20: 1 g via INTRAVENOUS
  Filled 2015-02-20: qty 10

## 2015-02-20 MED ORDER — LIDOCAINE-EPINEPHRINE (PF) 2 %-1:200000 IJ SOLN
10.0000 mL | Freq: Once | INTRAMUSCULAR | Status: AC
  Administered 2015-02-20: 10 mL via INTRADERMAL
  Filled 2015-02-20: qty 20

## 2015-02-20 MED ORDER — SODIUM CHLORIDE 0.9 % IV BOLUS (SEPSIS)
500.0000 mL | Freq: Once | INTRAVENOUS | Status: AC
  Administered 2015-02-20: 500 mL via INTRAVENOUS

## 2015-02-20 NOTE — ED Notes (Signed)
Ambulated in hallway by [employer].  Tolerated well.  No distress noted or complaints voiced.

## 2015-02-20 NOTE — ED Provider Notes (Signed)
This patient came in after a fall in the bathroom at home. Dr. Leonides Schanz evaluated the patient. Please see her note for further. I performed laceration repair.    LACERATION REPAIR Performed by: Hanley Hays Authorized by: Hanley Hays Consent: Verbal consent obtained. Risks and benefits: risks, benefits and alternatives were discussed Consent given by: patient Patient identity confirmed: provided demographic data Prepped and Draped in normal sterile fashion Wound explored  Laceration Location: Right forehead.   Laceration Length: 3 cm  No Foreign Bodies seen or palpated  Anesthesia: local infiltration  Local anesthetic: lidocaine 2 % with epinephrine  Anesthetic total: 3 ml  Irrigation method: syringe Amount of cleaning: standard  Skin closure: 5-0 Proline   Number of sutures: 5  Technique: Simple interrupted.   Patient tolerance: Patient tolerated the procedure well with no immediate complications.   Waynetta Pean, PA-C 02/20/15 [place], DO 02/20/15 [id number]

## 2015-02-20 NOTE — Discharge Instructions (Signed)
You may take Tylenol 1000 mg every 6 hours as needed for pain. This medication is found over-the-counter. Please follow up with your primary care physician in one week to have your sutures removed. Your imaging today showed no acute abnormality but if you develop severe headache, confusion, vomiting that will not stop, numbness or weakness on one side of your body, chest pain or shortness of breath, severe abdominal pain, blood in your stool or blood in your urine, please return to the hospital.   Facial Laceration  A facial laceration is a cut on the face. These injuries can be painful and cause bleeding. Lacerations usually heal quickly, but they need special care to reduce scarring. DIAGNOSIS  Your health care provider will take a medical history, ask for details about how the injury occurred, and examine the wound to determine how deep the cut is. TREATMENT  Some facial lacerations may not require closure. Others may not be able to be closed because of an increased risk of infection. The risk of infection and the chance for successful closure will depend on various factors, including the amount of time since the injury occurred. The wound may be cleaned to help prevent infection. If closure is appropriate, pain medicines may be given if needed. Your health care provider will use stitches (sutures), wound glue (adhesive), or skin adhesive strips to repair the laceration. These tools bring the skin edges together to allow for faster healing and a better cosmetic outcome. If needed, you may also be given a tetanus shot. HOME CARE INSTRUCTIONS  Only take over-the-counter or prescription medicines as directed by your health care provider.  Follow your health care provider's instructions for wound care. These instructions will vary depending on the technique used for closing the wound. For Sutures:  Keep the wound clean and dry.   If you were given a bandage (dressing), you should change it at  least once a day. Also change the dressing if it becomes wet or dirty, or as directed by your health care provider.   Wash the wound with soap and water 2 times a day. Rinse the wound off with water to remove all soap. Pat the wound dry with a clean towel.   After cleaning, apply a thin layer of the antibiotic ointment recommended by your health care provider. This will help prevent infection and keep the dressing from sticking.   You may shower as usual after the first 24 hours. Do not soak the wound in water until the sutures are removed.   Get your sutures removed as directed by your health care provider. With facial lacerations, sutures should usually be taken out after 4-5 days to avoid stitch marks.   Wait a few days after your sutures are removed before applying any makeup. For Skin Adhesive Strips:  Keep the wound clean and dry.   Do not get the skin adhesive strips wet. You may bathe carefully, using caution to keep the wound dry.   If the wound gets wet, pat it dry with a clean towel.   Skin adhesive strips will fall off on their own. You may trim the strips as the wound heals. Do not remove skin adhesive strips that are still stuck to the wound. They will fall off in time.  For Wound Adhesive:  You may briefly wet your wound in the shower or bath. Do not soak or scrub the wound. Do not swim. Avoid periods of heavy sweating until the skin adhesive has fallen off  on its own. After showering or bathing, gently pat the wound dry with a clean towel.   Do not apply liquid medicine, cream medicine, ointment medicine, or makeup to your wound while the skin adhesive is in place. This may loosen the film before your wound is healed.   If a dressing is placed over the wound, be careful not to apply tape directly over the skin adhesive. This may cause the adhesive to be pulled off before the wound is healed.   Avoid prolonged exposure to sunlight or tanning lamps while the skin  adhesive is in place.  The skin adhesive will usually remain in place for 5-10 days, then naturally fall off the skin. Do not pick at the adhesive film.  After Healing: Once the wound has healed, cover the wound with sunscreen during the day for 1 full year. This can help minimize scarring. Exposure to ultraviolet light in the first year will darken the scar. It can take 1-2 years for the scar to lose its redness and to heal completely.  SEEK MEDICAL CARE IF:  You have a fever. SEEK IMMEDIATE MEDICAL CARE IF:  You have redness, pain, or swelling around the wound.   You see ayellowish-white fluid (pus) coming from the wound.    This information is not intended to replace advice given to you by your health care provider. Make sure you discuss any questions you have with your health care provider.   Document Released: 04/05/2004 Document Revised: 03/19/2014 Document Reviewed: 10/09/2012 Elsevier Interactive Patient Education 2016 [place] Injury, Adult You have a head injury. Headaches and throwing up (vomiting) are common after a head injury. It should be easy to wake up from sleeping. Sometimes you must stay in the hospital. Most problems happen within the first 24 hours. Side effects may occur up to 7-10 days after the injury.  WHAT ARE THE TYPES OF HEAD INJURIES? Head injuries can be as minor as a bump. Some head injuries can be more severe. More severe head injuries include:  A jarring injury to the brain (concussion).  A bruise of the brain (contusion). This mean there is bleeding in the brain that can cause swelling.  A cracked skull (skull fracture).  Bleeding in the brain that collects, clots, and forms a bump (hematoma). WHEN SHOULD I GET HELP RIGHT AWAY?   You are confused or sleepy.  You cannot be woken up.  You feel sick to your stomach (nauseous) or keep throwing up (vomiting).  Your dizziness or unsteadiness is getting worse.  You have very bad,  lasting headaches that are not helped by medicine. Take medicines only as told by your doctor.  You cannot use your arms or legs like normal.  You cannot walk.  You notice changes in the black spots in the center of the colored part of your eye (pupil).  You have clear or bloody fluid coming from your nose or ears.  You have trouble seeing. During the next 24 hours after the injury, you must stay with someone who can watch you. This person should get help right away (call 911 in the [place] if you start to shake and are not able to control it (have seizures), you pass out, or you are unable to wake up. HOW CAN I PREVENT A HEAD INJURY IN THE FUTURE?  Wear seat belts.  Wear a helmet while bike riding and playing sports like football.  Stay away from dangerous activities around the house. WHEN CAN  I RETURN TO NORMAL ACTIVITIES AND ATHLETICS? See your doctor before doing these activities. You should not do normal activities or play contact sports until 1 week after the following symptoms have stopped:  Headache that does not go away.  Dizziness.  Poor attention.  Confusion.  Memory problems.  Sickness to your stomach or throwing up.  Tiredness.  Fussiness.  Bothered by bright lights or loud noises.  Anxiousness or depression.  Restless sleep. MAKE SURE YOU:   Understand these instructions.  Will watch your condition.  Will get help right away if you are not doing well or get worse.   This information is not intended to replace advice given to you by your health care provider. Make sure you discuss any questions you have with your health care provider.   Document Released: 02/09/2008 Document Revised: 03/19/2014 Document Reviewed: 11/03/2012 Elsevier Interactive Patient Education 2016 [place] A contusion is a deep bruise. Contusions are the result of a blunt injury to tissues and muscle fibers under the skin. The injury causes bleeding under the  skin. The skin overlying the contusion may turn blue, purple, or yellow. Minor injuries will give you a painless contusion, but more severe contusions may stay painful and swollen for a few weeks.  CAUSES  This condition is usually caused by a blow, trauma, or direct force to an area of the body. SYMPTOMS  Symptoms of this condition include:  Swelling of the injured area.  Pain and tenderness in the injured area.  Discoloration. The area may have redness and then turn blue, purple, or yellow. DIAGNOSIS  This condition is diagnosed based on a physical exam and medical history. An X-ray, CT scan, or MRI may be needed to determine if there are any associated injuries, such as broken bones (fractures). TREATMENT  Specific treatment for this condition depends on what area of the body was injured. In general, the best treatment for a contusion is resting, icing, applying pressure to (compression), and elevating the injured area. This is often called the RICE strategy. Over-the-counter anti-inflammatory medicines may also be recommended for pain control.  HOME CARE INSTRUCTIONS   Rest the injured area.  If directed, apply ice to the injured area:  Put ice in a plastic bag.  Place a towel between your skin and the bag.  Leave the ice on for 20 minutes, 2-3 times per day.  If directed, apply light compression to the injured area using an elastic bandage. Make sure the bandage is not wrapped too tightly. Remove and reapply the bandage as directed by your health care provider.  If possible, raise (elevate) the injured area above the level of your heart while you are sitting or lying down.  Take over-the-counter and prescription medicines only as told by your health care provider. SEEK MEDICAL CARE IF:  Your symptoms do not improve after several days of treatment.  Your symptoms get worse.  You have difficulty moving the injured area. SEEK IMMEDIATE MEDICAL CARE IF:   You have severe  pain.  You have numbness in a hand or foot.  Your hand or foot turns pale or cold.   This information is not intended to replace advice given to you by your health care provider. Make sure you discuss any questions you have with your health care provider.   Document Released: 12/06/2004 Document Revised: 11/17/2014 Document Reviewed: 07/14/2014 Elsevier Interactive Patient Education 2016 [employer]  Urinary Tract Infection Urinary tract infections (UTIs) can develop anywhere along  your urinary tract. Your urinary tract is your body's drainage system for removing wastes and extra water. Your urinary tract includes two kidneys, two ureters, a bladder, and a urethra. Your kidneys are a pair of bean-shaped organs. Each kidney is about the size of your fist. They are located below your ribs, one on each side of your spine. CAUSES Infections are caused by microbes, which are microscopic organisms, including fungi, viruses, and bacteria. These organisms are so small that they can only be seen through a microscope. Bacteria are the microbes that most commonly cause UTIs. SYMPTOMS  Symptoms of UTIs may vary by age and gender of the patient and by the location of the infection. Symptoms in young women typically include a frequent and intense urge to urinate and a painful, burning feeling in the bladder or urethra during urination. Older women and men are more likely to be tired, shaky, and weak and have muscle aches and abdominal pain. A fever may mean the infection is in your kidneys. Other symptoms of a kidney infection include pain in your back or sides below the ribs, nausea, and vomiting. DIAGNOSIS To diagnose a UTI, your caregiver will ask you about your symptoms. Your caregiver will also ask you to provide a urine sample. The urine sample will be tested for bacteria and white blood cells. White blood cells are made by your body to help fight infection. TREATMENT  Typically, UTIs can be treated  with medication. Because most UTIs are caused by a bacterial infection, they usually can be treated with the use of antibiotics. The choice of antibiotic and length of treatment depend on your symptoms and the type of bacteria causing your infection. HOME CARE INSTRUCTIONS  If you were prescribed antibiotics, take them exactly as your caregiver instructs you. Finish the medication even if you feel better after you have only taken some of the medication.  Drink enough water and fluids to keep your urine clear or pale yellow.  Avoid caffeine, tea, and carbonated beverages. They tend to irritate your bladder.  Empty your bladder often. Avoid holding urine for long periods of time.  Empty your bladder before and after sexual intercourse.  After a bowel movement, women should cleanse from front to back. Use each tissue only once. SEEK MEDICAL CARE IF:   You have back pain.  You develop a fever.  Your symptoms do not begin to resolve within 3 days. SEEK IMMEDIATE MEDICAL CARE IF:   You have severe back pain or lower abdominal pain.  You develop chills.  You have nausea or vomiting.  You have continued burning or discomfort with urination. MAKE SURE YOU:   Understand these instructions.  Will watch your condition.  Will get help right away if you are not doing well or get worse.   This information is not intended to replace advice given to you by your health care provider. Make sure you discuss any questions you have with your health care provider.   Document Released: 12/06/2004 Document Revised: 11/17/2014 Document Reviewed: 04/06/2011 Elsevier Interactive Patient Education [employer].

## 2015-02-20 NOTE — ED Notes (Signed)
Pt verbalized understanding of d/c instructions and has no further questions. Pt stable and NAD. Pt going back to [facility] with security from [facility].

## 2015-02-20 NOTE — ED Provider Notes (Signed)
By signing my name below, I, Randa Evens, attest that this documentation has been prepared under the direction and in the presence of [employer], DO. Electronically Signed: Randa Evens, ED Scribe. 02/20/2015. 12:27 AM.   TIME SEEN: 12:20  CHIEF COMPLAINT: Fall and head laceration   HPI:  HPI Comments: Rodney Malone is a 81 y.o. male with history of atrial fibrillation on Coumadin, CIDP brought in by ambulance, who presents to the Emergency Department complaining of fall onset tonight PTA. Pt lives in independent living at [facility] with his wife.  Pt states that tonight that he got up to use the restroom and felt dizzy prior to falling. Pt presents with right forehead laceration with associated swelling. Pt states that he is unsure what he hit his head and is unsure of he lost consciousness. Pt states that he ambulates with a walker at baseline. Pt states that without the walker he would fall, but states that the dizziness tonight was new. Pt does report Hx of CIDP and that he is chronically has numbness in his feet. He also report having a cough for the last month. Denies CP, SOB, new numbness (has chronic neuropathy in his bilateral lower extremities), weakness, dysuria, hematuria. States he has had intermittent diarrhea no more than 2 episodes a day for the past 2-3 months. No vomiting. Pt states that he is unsure of his last tetanus shot.   ROS: See HPI Constitutional: no fever  Eyes: no drainage  ENT: no runny nose   Cardiovascular:  no chest pain  Resp: no SOB  GI: no vomiting GU: no dysuria Integumentary: no rash  Allergy: no hives  Musculoskeletal: no leg swelling  Neurological: no slurred speech ROS otherwise negative  PAST MEDICAL HISTORY/PAST SURGICAL HISTORY:  Past Medical History  Diagnosis Date  . Hyperlipidemia   . Atrial fibrillation ([place])   . Osteopenia   . H/O: CVA (cerebrovascular accident)   . Unsteady gait   . Allergic rhinitis   . Nephrolithiasis    . Nevus, atypical   . Recurrent boils   . Hyperbilirubinemia   . Colonic polyp   . Mitral regurgitation   . CIDP (chronic inflammatory demyelinating polyneuropathy) ([place])   . Long term (current) use of anticoagulants     MEDICATIONS:  Prior to Admission medications   Medication Sig Start Date End Date Taking? Authorizing Provider  alendronate (FOSAMAX) 70 MG tablet Take 70 mg by mouth every 7 (seven) days. Take with a full glass of water on an empty stomach.    Historical Provider, MD  cetirizine (ZYRTEC) 10 MG tablet Take 10 mg by mouth daily.     Historical Provider, MD  ciclopirox (LOPROX) 0.77 % cream Apply topically 2 (two) times daily.    Historical Provider, MD  metoprolol succinate (TOPROL XL) 100 MG 24 hr tablet Take 100 mg by mouth daily.  01/27/08   Historical Provider, MD  pravastatin (PRAVACHOL) 20 MG tablet Take 20 mg by mouth daily.    Historical Provider, MD  urea (CARMOL) 40 % CREA 40 % 02/13/08   Historical Provider, MD  warfarin (COUMADIN) 2 MG tablet Take 2 mg by mouth daily. TAKE 1/2 TABLET  3 times a week and 1 tablet 4 times a week.    Historical Provider, MD    ALLERGIES:  Allergies  Allergen Reactions  . Penicillins Other (See Comments)    UNKNOWN    SOCIAL HISTORY:  Social History  Substance Use Topics  . Smoking status: Former  Smoker    Quit date: 10/06/1973  . Smokeless tobacco: Never Used  . Alcohol Use: Yes     Comment: usually have a couple of drinks a day    FAMILY HISTORY: No family history on file.  EXAM: BP 129/91 mmHg  Pulse 87  Temp(Src) 98.2 F (36.8 C) (Oral)  Resp 16  Ht 5' 11.75" (1.822 m)  Wt 172 lb (78.019 kg)  BMI 23.50 kg/m2  SpO2 93%   CONSTITUTIONAL: Alert and oriented x3 and responds appropriately to questions. Well-appearing; well-nourished; GCS 15; elderly and in no distress HEAD: Normocephalic; large hematoma with 6 cm laceration to the right forehead EYES: Conjunctivae clear, PERRL, EOMI ENT: normal nose; no  rhinorrhea; moist mucous membranes; pharynx without lesions noted; no dental injury; no septal hematoma; no hemotympanum NECK: Supple, no meningismus, no LAD; no midline spinal tenderness, step-off or deformity CARD: Irregularly irregular; S1 and S2 appreciated; no murmurs, no clicks, no rubs, no gallops RESP: Normal chest excursion without splinting or tachypnea; breath sounds clear and equal bilaterally; no wheezes, no rhonchi, no rales; no hypoxia or respiratory distress CHEST:  chest wall stable, no crepitus or ecchymosis or deformity, tender of left chest wall  ABD/GI: Normal bowel sounds; non-distended; soft, non-tender, no rebound, no guarding PELVIS:  stable, nontender to palpation BACK:  The back appears normal and is non-tender to palpation, there is no CVA tenderness; no midline spinal tenderness, step-off or deformity EXT: Tender of right shoulder posteriorly with no deformity or loss of fullness; Normal ROM in all joints including the right shoulder; otherwise extremities are non-tender to palpation; no edema; normal capillary refill; no cyanosis, otherwise no bony tenderness or bony deformity of patient's extremities, no joint effusion, no ecchymosis or lacerations    SKIN: Normal color for age and race; warm; abrasion to the right elbow and abrasions to the  2nd and 3rd MCP on left hand and over 2nd left finger  NEURO: Moves all extremities equally, decreased sensation to bilateral feet but otherwise sensation to light touch intact diffusely, cranial nerves II through XII intact PSYCH: The patient's mood and manner are appropriate. Grooming and personal hygiene are appropriate.  MEDICAL DECISION MAKING: Patient here with fall. He does report feeling lightheaded before falling. He has had this before. No chest pain or shortness of breath. No recent infectious symptoms other than a dry cough for the past month. He appears well hydrated on exam. Will obtain labs, urine to evaluate for any  organic causes for his dizziness that led to his fall. He chronically has atrial fibrillation which is unchanged and is anticoagulated. Currently neurologically intact and hemodynamically stable. We'll give gentle IV hydration. We'll obtain CT of his head and cervical spine. We'll also obtain left rib series and a right shoulder x-ray. He declines pain medication at this time. We'll update his tetanus vaccination and repair his laceration.  ED PROGRESS: Patient's imaging shows no acute abnormality. He does have severe C5-C6 and moderate to severe C4-C5 neural foraminal narrowing. Denies any neck pain, radicular symptoms. No numbness in his arms he has good strength in bilateral upper extremies. I discussed this finding with patient that I do not feel anything needs to be done emergently. He reports he is having mild headache and right shoulder pain. We'll give Tylenol for pain per his request. Labs show mild leukocytosis which appears chronic. He does also appear to have a UTI. Will give ceftriaxone in the emergency department. We'll ambulate patient with walker. Patient would  like to be discharged home with his wife. Laceration repaired by Sula Rumple, PA. Please see his note for further information.   3:30 AM  Pt with tach with a walker without any difficulty or lightheadedness. He reports he is ready to be discharged home. We'll discharge with Keflex for UTI. Have advised him to have his stitches removed in 7 days by his PCP. Discussed return precautions including head injury return precautions. Recommended using Tylenol for pain as needed. I do not feel he needs any stronger pain medication at this time as this may worsen his intermittent dizziness and make him more unsteady on his feet than he already is. He verbalizes understanding is comfortable with this plan.    Date: 02/20/2015 00:03  Rate: 94  Rhythm: a fib  QRS Axis: normal  Intervals: normal  ST/T Wave abnormalities: normal  Conduction  Disutrbances: none  Narrative Interpretation: a fib           I personally performed the services described in this documentation, which was scribed in my presence. The recorded information has been reviewed and is accurate.    [place], DO 02/20/15 [phone]

## 2015-02-20 NOTE — ED Notes (Addendum)
Patient brought by ems from [facility].  Reports having a history of CIDP and has intermittant dizziness.  Was in the bathroom when he became dizzy and fell hitting head on toilet.  Hematoma and laceration noted to right temple.  Takes coumadin.  Denies any LOC.  Also reporting right shoulder pain.

## 2015-02-20 NOTE — ED Notes (Signed)
Taken to xray department at this time. 

## 2015-02-20 NOTE — ED Notes (Signed)
Physician at the bedside suturing laceration.  To give medications when he is finished.

## 2015-02-21 LAB — URINE CULTURE

## 2015-02-25 DIAGNOSIS — W01198A Fall on same level from slipping, tripping and stumbling with subsequent striking against other object, initial encounter: Secondary | ICD-10-CM | POA: Diagnosis not present

## 2015-02-25 DIAGNOSIS — Z7901 Long term (current) use of anticoagulants: Secondary | ICD-10-CM | POA: Diagnosis not present

## 2015-02-25 DIAGNOSIS — Z4802 Encounter for removal of sutures: Secondary | ICD-10-CM | POA: Diagnosis not present

## 2015-02-25 DIAGNOSIS — Z6824 Body mass index (BMI) 24.0-24.9, adult: Secondary | ICD-10-CM | POA: Diagnosis not present

## 2015-02-25 DIAGNOSIS — S0191XA Laceration without foreign body of unspecified part of head, initial encounter: Secondary | ICD-10-CM | POA: Diagnosis not present

## 2015-02-25 DIAGNOSIS — N390 Urinary tract infection, site not specified: Secondary | ICD-10-CM | POA: Diagnosis not present

## 2015-02-25 DIAGNOSIS — I480 Paroxysmal atrial fibrillation: Secondary | ICD-10-CM | POA: Diagnosis not present

## 2015-03-01 ENCOUNTER — Ambulatory Visit: Payer: Private Health Insurance - Indemnity | Admitting: Neurology

## 2015-03-17 DIAGNOSIS — Z7901 Long term (current) use of anticoagulants: Secondary | ICD-10-CM | POA: Diagnosis not present

## 2015-03-17 DIAGNOSIS — I480 Paroxysmal atrial fibrillation: Secondary | ICD-10-CM | POA: Diagnosis not present

## 2015-04-01 ENCOUNTER — Encounter: Payer: Self-pay | Admitting: Neurology

## 2015-04-01 ENCOUNTER — Ambulatory Visit (INDEPENDENT_AMBULATORY_CARE_PROVIDER_SITE_OTHER): Payer: Medicare Other | Admitting: Neurology

## 2015-04-01 VITALS — BP 112/68 | HR 97 | Ht 71.0 in | Wt 171.0 lb

## 2015-04-01 DIAGNOSIS — R413 Other amnesia: Secondary | ICD-10-CM

## 2015-04-01 DIAGNOSIS — G6181 Chronic inflammatory demyelinating polyneuritis: Secondary | ICD-10-CM

## 2015-04-01 NOTE — Progress Notes (Signed)
NEUROLOGY FOLLOW UP OFFICE NOTE  Quamaine Kirchberg [id number]  HISTORY OF PRESENT ILLNESS: I had the pleasure of seeing Khy Ryba in follow-up in the neurology clinic on 04/01/2015.  The patient was last seen 6 months ago for CIDP and memory loss. Since his last visit, he reports that strength is overall stable. He did fall last month when he got up at night to use the restroom. He recalls feeling dizzy and passing out then falling. He quickly came to and called his wife. He sustained a right forehead laceration and bruise on his right thigh. He was brought to the ER and required stitches on his forehead. Head CT done did not show any acute intracranial changes, there was right frontal encephalomalacia and a moderate right frontal scalp hematoma. CT cervical spine showed severe C5-6 and moderate to severe C4-5 neural foraminal narrowing.   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 He asked his secretary to take over home bill payments in the past 6 months. He has stopped driving. He reports his main concern is his left toe pain and sees podiatry soon. He has occasional dizziness, he has occasional numbness in his hands and feet. No dysarthria/dysphagia, neck pain, bowel/bladder dysfunction. He denies any weakness in his upper extremities or proximal lower extremities, but continues to have difficulty with steps.  HPI: This is a very pleasant 82 yo LH man with a history of atrial fibrillation on anticoagulation with Coumadin, right frontal stroke in 2004 with no residual deficits, hyperlipidemia, and chronic inflammatory demyelinating polyneuropathy (CIDP) diagnosed around 15 years ago. He recalls symptoms started after he stumbled and broke 2 toes on the left feet, followed by weakness in both feet and legs. He has been wearing AFOs for the past 10 years. He reports numbness and tingling in both feet up to his ankles. He also has  numbness and tingling in both hands and has has 4 different surgeries for Dupuytren's contractures. He reports going to [facility] for several years but was told to "just stay in [place] because we can't do anything for you." The last time he did PT was 7 years ago.  Records from [facility] from 2004 to 2011 were reviewed. He was seeing neuromuscular specialist Dr. Roselyn Meier. 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 He was tried on multiple therapies, including IVIG, plasmapharesis, Cellcept, and steroids without major benefit. They had tried Lyrica and Cymbalta for the dysesthesias but had side effects of nausea. In 2009, he was noted to have a violaceous discoloration in both feet that appeared to be venostasis. The patient does not recall trying the different therapies.  He has been evaluated by our neuromuscular specialist Dr. Posey Pronto for CIDP. Per Dr. Serita Grit note, "he has been clinically stable for 10+ years, suggesting that the disease in in remission. Based on the severity of his neuropathy, there is probably very little demyelinating neuropathy and it has all become axon loss so there is really no nerves left to salvage in the legs." Since there is no progression of symptoms, there is no utility to trying other therapies or doing additional work-up. Dr. Posey Pronto expressed concern about driving and fall risk while on Coumadin. Mr. Poarch tells me that after his visit, his children have gotten involved and he has stopped driving. He uses his walker at all times. He denies any headaches.  Diagnostic Data: Head CT done 07/14/13 personally reviewed showing right frontal encephalomalacia, no acute findings.  PAST MEDICAL HISTORY: Past Medical History  Diagnosis Date  . Hyperlipidemia   . Atrial fibrillation ([place] [facility] [place])   . Osteopenia   . H/O: CVA (cerebrovascular accident)   . Unsteady gait     . Allergic rhinitis   . Nephrolithiasis   . Nevus, atypical   . Recurrent boils   . Hyperbilirubinemia   . Colonic polyp   . Mitral regurgitation   . CIDP (chronic inflammatory demyelinating polyneuropathy) ([place])   . Long term (current) use of anticoagulants     MEDICATIONS: Current Outpatient Prescriptions on File Prior to Visit  Medication Sig Dispense Refill  . cetirizine (ZYRTEC) 10 MG tablet Take 10 mg by mouth as needed.     . metoprolol succinate (TOPROL XL) 100 MG 24 hr tablet Take 100 mg by mouth daily.     . pravastatin (PRAVACHOL) 20 MG tablet Take 20 mg by mouth daily.    Marland Kitchen warfarin (COUMADIN) 2 MG tablet Take 2 mg by mouth daily. TAKE 1/2 TABLET  3 times a week and 1 tablet 4 times a week.    Marland Kitchen alendronate (FOSAMAX) 70 MG tablet Take 70 mg by mouth every 7 (seven) days. Reported on 04/01/2015    . ciclopirox (LOPROX) 0.77 % cream Apply topically 2 (two) times daily. Reported on 04/01/2015    . urea (CARMOL) 40 % CREA Reported on 04/01/2015     No current facility-administered medications on file prior to visit.    ALLERGIES: Allergies  Allergen Reactions  . Penicillins Other (See Comments)    UNKNOWN    FAMILY HISTORY: No family history on file.  SOCIAL HISTORY: Social History   Social History  . Marital Status: Married    Spouse Name: N/A  . Number of Children: 2  . Years of Education: N/A   Occupational History  . broadcasting    Social History Main Topics  . Smoking status: Former Smoker    Quit date: 10/06/1973  . Smokeless tobacco: Never Used  . Alcohol Use: Yes     Comment: usually have a couple of drinks a day  . Drug Use: No  . Sexual Activity: Not on file   Other Topics Concern  . Not on file   Social History Narrative   Patient is Married([name]). Retired. Lives in  single level home, Independent Living  section at [place] since 2009.   Former smoker, minimal Alcohol history   Patient has Advanced planning  documents: Living Will             REVIEW OF SYSTEMS: Constitutional: No fevers, chills, or sweats, no generalized fatigue, change in appetite Eyes: No visual changes, double vision, eye pain Ear, nose and throat: No hearing loss, ear pain, nasal congestion, sore throat Cardiovascular: No chest pain, palpitations Respiratory:  No shortness of breath at rest or with exertion, wheezes GastrointestinaI: No nausea, vomiting, diarrhea, abdominal pain, fecal incontinence Genitourinary:  No dysuria, urinary retention or frequency Musculoskeletal:  No neck pain,+ back pain Integumentary: No rash, pruritus, skin lesions Neurological: as above Psychiatric: No depression, insomnia, anxiety Endocrine: No palpitations, fatigue, diaphoresis, mood swings, change in appetite, change in weight, increased thirst Hematologic/Lymphatic:  No anemia, purpura, petechiae. Allergic/Immunologic: no itchy/runny eyes, nasal congestion, recent allergic reactions, rashes  PHYSICAL EXAM: Filed Vitals:   04/01/15 1122  BP: 112/68  Pulse: 97   General: No acute distress Head:  Normocephalic/atraumatic Neck:  supple, no paraspinal tenderness, full range of motion Heart:  Regular rate and rhythm Lungs:  Clear to auscultation bilaterally Back: No paraspinal tenderness Skin/Extremities: No rash, no edema Neurological Exam: alert and oriented to person, place, and time. No aphasia or dysarthria. Fund of knowledge is appropriate.  Remote memory intact.  Attention and concentration are normal.    Able to name objects and repeat phrases.  MMSE - Mini Mental State Exam 04/01/2015 08/30/2014 02/24/2014  Orientation to time 4 4 5   Orientation to Place 5 5 5   Registration 3 3 3   Attention/ Calculation 5 5 5   Recall 1 3 3   Language- name 2 objects 2 2 2   Language- repeat 1 1 1   Language- follow 3 step command 3 2 3   Language- read & follow direction 1 1 1   Write a sentence 1 1 1   Copy design 1 1 1   Total score 27 28  30    Cranial nerves: Pupils equal, round, reactive to light.  Extraocular movements intact with no nystagmus. Visual fields full. Facial sensation intact. No facial asymmetry. Tongue, uvula, palate midline.  Motor: Bulk and tone normal, muscle strength 5/5 on both UE with contractures in both hands, 5/5 proximal bilateral LE (hip flexors, extensors, abduction/adduction), 5/5 bilateral knee flexion/extension, 0/5 bilateral dorsiflexion, plantarflexion, toe extension. (similar to prior visit). No pronator drift.  Sensation decreased in both LE.  Deep tendon reflexes 2+ on both UE, unable to elicit reflexes in both LE. Finger to nose testing intact.  Gait with walker slow and cautious with AFOs bilaterally, no ataxia.  IMPRESSION: This is a very pleasant 82 yo LH man with a history of atrial fibrillation on Coumadin, right frontal stroke with no residual deficits, hyperlipidemia, and CIDP with bilateral foot drop. He has been to [facility] and has been evaluated by our neuromuscular specialist Dr. Posey Pronto as well for the CIDP, at this point he has been clinically stable and is likely in remission. His neurological exam is stable. He was also reporting memory changes, MMSE today still normal 27/30, however there has been mild decline over the past year. We again discussed the option of starting cholinesterase inhibitors such as Aricept, he would like to hold off for now. We discussed the importance of physical exercise and brain stimulation exercises for brain health. We discussed his fall risk with CIDP/bilateral foot drop, he has talked to Cardiology and the benefits of anticoagulation still outweigh risk of bleeding complications. He knows to use his walker at all times. He is not driving. He will follow-up in 1 year and knows to call us for any changes.   Thank you for allowing me to participate in his care.  Please do not hesitate to call for any questions or concerns.  The duration of this appointment visit was  25 minutes of face-to-face time with the patient.  Greater than 50% of this time was spent in counseling, explanation of diagnosis, planning of further management, and coordination of care.   Ellouise Newer, M.D.   CC: Dr. Brigitte Pulse

## 2015-04-01 NOTE — Patient Instructions (Signed)
1. Use walker at all times 2. Follow-up in 1 year, call for any changes

## 2015-04-05 DIAGNOSIS — R413 Other amnesia: Secondary | ICD-10-CM | POA: Insufficient documentation

## 2015-04-05 NOTE — Progress Notes (Signed)
Note routed

## 2015-04-19 DIAGNOSIS — I480 Paroxysmal atrial fibrillation: Secondary | ICD-10-CM | POA: Diagnosis not present

## 2015-04-19 DIAGNOSIS — Z7901 Long term (current) use of anticoagulants: Secondary | ICD-10-CM | POA: Diagnosis not present

## 2015-05-18 ENCOUNTER — Encounter: Payer: Self-pay | Admitting: Podiatry

## 2015-05-18 ENCOUNTER — Ambulatory Visit (INDEPENDENT_AMBULATORY_CARE_PROVIDER_SITE_OTHER): Payer: Medicare Other | Admitting: Podiatry

## 2015-05-18 DIAGNOSIS — B351 Tinea unguium: Secondary | ICD-10-CM

## 2015-05-18 DIAGNOSIS — M79676 Pain in unspecified toe(s): Secondary | ICD-10-CM | POA: Diagnosis not present

## 2015-05-18 DIAGNOSIS — Z7901 Long term (current) use of anticoagulants: Secondary | ICD-10-CM | POA: Diagnosis not present

## 2015-05-18 DIAGNOSIS — I480 Paroxysmal atrial fibrillation: Secondary | ICD-10-CM | POA: Diagnosis not present

## 2015-05-18 NOTE — Patient Instructions (Signed)
Today after trimming your toenails there was bleeding at the end of the left big toe which was dressed with antibiotic ointment and a bandage Remove the bandage in the left great toe 1-3 days and continue to apply topical antibiotic ointment such as Neosporin and a Band-Aid to the left great toe daily until a scab forms

## 2015-05-18 NOTE — Progress Notes (Signed)
Patient ID: Rodney Malone, male   DOB: 04-Apr-1922, 82 y.o.   MRN: [id number]  Subjective: This patient presents for scheduled visit complaining of thickened elongated toenails which are comfortable walking wearing shoes and is requesting toenail debridement  Objective: Orientated x 3 Patient wears bilateral dropfoot braces The toenails are elongated, brittle, form, discolored and tender direct palpation 6-10  Assessment: Symptomatic onychomycoses 6-10  Plan: Debridement toenails 6-10 mechanically and electrically. Slight bleeding distal left hallux treated with antibiotic ointment and gauze dressing. Patient instructed remove dressing 1-3 days and to continue apply topical antibiotic ointment and a Band-Aid once daily to the end of the left great toe until a scab forms  Reappoint 3 months

## 2015-06-21 DIAGNOSIS — I480 Paroxysmal atrial fibrillation: Secondary | ICD-10-CM | POA: Diagnosis not present

## 2015-06-21 DIAGNOSIS — Z7901 Long term (current) use of anticoagulants: Secondary | ICD-10-CM | POA: Diagnosis not present

## 2015-07-27 DIAGNOSIS — I480 Paroxysmal atrial fibrillation: Secondary | ICD-10-CM | POA: Diagnosis not present

## 2015-07-27 DIAGNOSIS — Z7901 Long term (current) use of anticoagulants: Secondary | ICD-10-CM | POA: Diagnosis not present

## 2015-07-27 DIAGNOSIS — I639 Cerebral infarction, unspecified: Secondary | ICD-10-CM | POA: Diagnosis not present

## 2015-08-24 ENCOUNTER — Ambulatory Visit (INDEPENDENT_AMBULATORY_CARE_PROVIDER_SITE_OTHER): Payer: Medicare Other | Admitting: Podiatry

## 2015-08-24 ENCOUNTER — Ambulatory Visit: Payer: Medicare Other | Admitting: Podiatry

## 2015-08-24 ENCOUNTER — Encounter: Payer: Self-pay | Admitting: Podiatry

## 2015-08-24 DIAGNOSIS — M79676 Pain in unspecified toe(s): Secondary | ICD-10-CM | POA: Diagnosis not present

## 2015-08-24 DIAGNOSIS — B351 Tinea unguium: Secondary | ICD-10-CM

## 2015-08-24 NOTE — Patient Instructions (Signed)
Removed Band-Aid on fourth left toe 1-3 days and continue to apply topical antibiotic ointment and a Band-Aid daily until healed

## 2015-08-25 NOTE — Progress Notes (Signed)
Patient ID: Rodney Malone, male   DOB: 1922-11-06, 82 y.o.   MRN: [id number]  Subjective: This patient presents again complaining of thick, elongated toenails with direct shoe pressure and walking and is requesting toenail debridement  Objective: Orientated 3 The toenails are elongated, brittle, discolored, hypertrophic and tender direct palpation 6-10  Assessment: Symptomatic onychomycoses 6-10  Plan: Debridement toenails 10 mechanically and electrically. Slight pinpoint bleeding distal fourth  toe today with topical antibiotic ointment and Band-Aids Patient instructed to apply topical antibiotic ointment to the fourth leftt toe daily until healed.   Reappoint 3 months

## 2015-08-29 DIAGNOSIS — Z6822 Body mass index (BMI) 22.0-22.9, adult: Secondary | ICD-10-CM | POA: Diagnosis not present

## 2015-08-29 DIAGNOSIS — Z7901 Long term (current) use of anticoagulants: Secondary | ICD-10-CM | POA: Diagnosis not present

## 2015-08-29 DIAGNOSIS — I480 Paroxysmal atrial fibrillation: Secondary | ICD-10-CM | POA: Diagnosis not present

## 2015-08-29 DIAGNOSIS — Z1389 Encounter for screening for other disorder: Secondary | ICD-10-CM | POA: Diagnosis not present

## 2015-08-29 DIAGNOSIS — W19XXXA Unspecified fall, initial encounter: Secondary | ICD-10-CM | POA: Diagnosis not present

## 2015-08-29 DIAGNOSIS — I638 Other cerebral infarction: Secondary | ICD-10-CM | POA: Diagnosis not present

## 2015-09-08 DIAGNOSIS — H353132 Nonexudative age-related macular degeneration, bilateral, intermediate dry stage: Secondary | ICD-10-CM | POA: Diagnosis not present

## 2015-09-08 DIAGNOSIS — Z961 Presence of intraocular lens: Secondary | ICD-10-CM | POA: Diagnosis not present

## 2015-09-08 DIAGNOSIS — H43813 Vitreous degeneration, bilateral: Secondary | ICD-10-CM | POA: Diagnosis not present

## 2015-09-08 DIAGNOSIS — H04123 Dry eye syndrome of bilateral lacrimal glands: Secondary | ICD-10-CM | POA: Diagnosis not present

## 2015-09-27 DIAGNOSIS — Z7901 Long term (current) use of anticoagulants: Secondary | ICD-10-CM | POA: Diagnosis not present

## 2015-09-27 DIAGNOSIS — I480 Paroxysmal atrial fibrillation: Secondary | ICD-10-CM | POA: Diagnosis not present

## 2015-11-09 DIAGNOSIS — Z7901 Long term (current) use of anticoagulants: Secondary | ICD-10-CM | POA: Diagnosis not present

## 2015-11-09 DIAGNOSIS — I480 Paroxysmal atrial fibrillation: Secondary | ICD-10-CM | POA: Diagnosis not present

## 2015-11-23 ENCOUNTER — Encounter: Payer: Self-pay | Admitting: Cardiology

## 2015-11-23 DIAGNOSIS — Z23 Encounter for immunization: Secondary | ICD-10-CM | POA: Diagnosis not present

## 2015-11-30 ENCOUNTER — Ambulatory Visit (INDEPENDENT_AMBULATORY_CARE_PROVIDER_SITE_OTHER): Payer: Medicare Other | Admitting: Podiatry

## 2015-11-30 ENCOUNTER — Encounter: Payer: Self-pay | Admitting: Podiatry

## 2015-11-30 DIAGNOSIS — B351 Tinea unguium: Secondary | ICD-10-CM

## 2015-11-30 DIAGNOSIS — M79676 Pain in unspecified toe(s): Secondary | ICD-10-CM

## 2015-12-01 NOTE — Progress Notes (Signed)
Patient ID: Rodney Malone, male   DOB: 08-Jul-1922, 82 y.o.   MRN: [id number]     Subjective: This patient presents again complaining of thick, elongated toenails with direct shoe pressure and walking and is requesting toenail debridement  Objective: Orientated 3 Bilateral AFOs DP and PT pulses 2/4 bilaterally Reflex immediate bilaterally General rubor bilaterally Dorsi flexion and plantar flexion 0/4 bilaterally The toenails are elongated, brittle, discolored, hypertrophic and tender direct palpation 6-10  Assessment: Symptomatic onychomycoses 6-10 History of chronic inflammatory demyelinating polyneuropathy  Plan: Debridement toenails 10 mechanically and electrically. Without any bleeding  Reappoint 3 months

## 2015-12-07 NOTE — Progress Notes (Deleted)
Rodney Malone Date of Birth: 07-23-1922 Medical Record [id number]  History of Present Illness: Rodney Malone is seen for followup of mitral insufficiency and atrial fibrillation. 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 He has a history of chronic atrial fibrillation and has been on Coumadin. He has a remote history of stroke. He is limited predominantly by his Neurologic issues. His walking is very limited. He uses a walker. He denies any SOB or chest pain. His weight has been stable. He's had mild edema. He denies any dizziness, palpitations, or syncope. He has no orthopnea or PND. He has had 3 falls since his last visit- he just loses his balance. No significant injury. No bleeding problems.   Current Outpatient Prescriptions on File Prior to Visit  Medication Sig Dispense Refill  . alendronate (FOSAMAX) 70 MG tablet Take 70 mg by mouth every 7 (seven) days. Reported on 04/01/2015    . cetirizine (ZYRTEC) 10 MG tablet Take 10 mg by mouth as needed.     . ciclopirox (LOPROX) 0.77 % cream Apply topically 2 (two) times daily. Reported on 04/01/2015    . metoprolol succinate (TOPROL XL) 100 MG 24 hr tablet Take 100 mg by mouth daily.     . pravastatin (PRAVACHOL) 20 MG tablet Take 20 mg by mouth daily.    . urea (CARMOL) 40 % CREA Reported on 04/01/2015    . warfarin (COUMADIN) 2 MG tablet Take 2 mg by mouth daily. TAKE 1/2 TABLET  3 times a week and 1 tablet 4 times a week.     No current facility-administered medications on file prior to visit.     Allergies  Allergen Reactions  . Penicillins Other (See Comments)    UNKNOWN    Past Medical History:  Diagnosis Date  . Allergic rhinitis   . Atrial fibrillation ([place])   . CIDP (chronic inflammatory demyelinating polyneuropathy) ([place])   . Colonic polyp   . H/O: CVA (cerebrovascular accident)   . Hyperbilirubinemia   . Hyperlipidemia   . Long term (current) use of anticoagulants   . Mitral regurgitation   . Nephrolithiasis     . Nevus, atypical   . Osteopenia   . Recurrent boils   . Unsteady gait     Past Surgical History:  Procedure Laterality Date  . APPENDECTOMY      History  Smoking Status  . Former Smoker  . Quit date: 10/06/1973  Smokeless Tobacco  . Never Used    History  Alcohol Use  . Yes    Comment: usually have a couple of drinks a day    No family history on file.  Review of Systems: The review of systems is as noted in HPI. All other systems were reviewed and are negative.  Physical Exam: There were no vitals taken for this visit. He is a pleasant, elderly white male in no acute distress. HEENT: Normal.  Lungs: Clear Cardiovascular: Irregular rate and rhythm. Normal S1 and S2. Grade 99991111 holosystolic murmur heard at the left sternal border radiating to the axilla. Abdomen: Soft and nontender. No hepatosplenomegaly or masses. No bruits. Extremities: he has braces on both legs. Trace edema. Patient has significant muscle atrophy in both legs with weakness. Skin: Warm and dry. Neuro: Patient has short-term memory loss. He is alert and oriented x3. He walks with a walker.  LABORATORY DATA: Ecg today shows atrial fibrillation with rate 91 bpm. Prolonged QTc  487 msec. Otherwise normal. I have personally  reviewed and interpreted this study.  Labs reviewed from 02/14/16: cholesterol 103, triglycerides 98, HDL 36, LDL 47. CMET and TSH are normal.   Assessment / Plan: 1. Severe Mitral insufficiency secondary to MV prolapse with partially flail posterior leaflet.   Last Echo 7/15. I would argue against mitral valve repair or Mitral clip given his advanced age and multiple comorbidities. He is asymptomatic and has no evidence of congestive heart failure on exam. His LV function is preserved so his long-term prognosis is probably not significantly impacted by his mitral insufficiency. We will continue his medical therapy and follow up in one year.  2. Atrial fibrillation. Rate is well  controlled and he is on chronic anticoagulation. Although he has experienced some falls the benefit of anticoagulation still outweighs the risk of bleeding complications.   3. CIDP.  4. Dementia.  5. Remote CVA.  6. Hyperlipidemia, well controlled on pravastatin.

## 2015-12-08 ENCOUNTER — Ambulatory Visit: Payer: Private Health Insurance - Indemnity | Admitting: Cardiology

## 2015-12-15 DIAGNOSIS — Z7901 Long term (current) use of anticoagulants: Secondary | ICD-10-CM | POA: Diagnosis not present

## 2015-12-15 DIAGNOSIS — I480 Paroxysmal atrial fibrillation: Secondary | ICD-10-CM | POA: Diagnosis not present

## 2015-12-16 ENCOUNTER — Encounter: Payer: Self-pay | Admitting: Physician Assistant

## 2015-12-16 ENCOUNTER — Ambulatory Visit (INDEPENDENT_AMBULATORY_CARE_PROVIDER_SITE_OTHER): Payer: Medicare Other | Admitting: Physician Assistant

## 2015-12-16 VITALS — BP 129/71 | HR 65 | Ht 72.0 in | Wt 164.8 lb

## 2015-12-16 DIAGNOSIS — E785 Hyperlipidemia, unspecified: Secondary | ICD-10-CM

## 2015-12-16 DIAGNOSIS — I639 Cerebral infarction, unspecified: Secondary | ICD-10-CM | POA: Diagnosis not present

## 2015-12-16 DIAGNOSIS — I482 Chronic atrial fibrillation, unspecified: Secondary | ICD-10-CM

## 2015-12-16 DIAGNOSIS — I340 Nonrheumatic mitral (valve) insufficiency: Secondary | ICD-10-CM | POA: Diagnosis not present

## 2015-12-16 DIAGNOSIS — G6181 Chronic inflammatory demyelinating polyneuritis: Secondary | ICD-10-CM

## 2015-12-16 NOTE — Patient Instructions (Addendum)
Medication Instructions:   Your physician recommends that you continue on your current medications as directed. Please refer to the Current Medication list given to you today.     If you need a refill on your cardiac medications before your next appointment, please call your pharmacy.  Labwork: RETURN FOR FASTING LABS IN THE AM    Testing/Procedures: NONE ORDER TODAY    Follow-Up: Your physician wants you to follow-up in:  IN 6   MONTHS WITH DR Martinique   You will receive a reminder letter in the mail two months in advance. If you don't receive a letter, please call our office to schedule the follow-up appointment.      Any Other Special Instructions Will Be Listed Below (If Applicable).

## 2015-12-16 NOTE — Progress Notes (Signed)
Cardiology Office Note    Date:  12/16/2015   ID:  [name], DOB 1922/12/02, MRN [id number]  PCP:  [name], MD  Cardiologist:  Dr. Martinique Neurology: Dr. Ellouise Newer for chronic inflammatory demyelinating polyneuropathy  Chief Complaint  Patient presents with  . Follow-up    seen for Dr. Martinique, 1 year followup    History of Present Illness:  Rodney Malone is a 82 y.o. male with PMH of chronic atrial fibrillation, HLD, h/o CVA and severe mitral regurgitation. Patient had a history of mitral valve prolapse with severe MR, he also had CIDP managed by neurology. He is on Coumadin for his chronic atrial fibrillation. Last echocardiogram obtained on 09/28/2013 showed EF 60-65%, no regional wall motion abnormality, moderate flail motion involving the mid scallop of posterior leaflet due to rupture of one or more cords, severe MR directed eccentrically and toward the septum, severely dilated left atrium, PA peak pressure 47 mmHg. His last office visit was on 09/23/2014 at which time he was doing well. It was felt that due to his advanced age and multiple comorbidities, would prefer not to pursue invasive mitral valve repair or mitral clip. He was asymptomatic at the time and had no evidence of congestive heart failure on exam.  He presents today for annual follow-up, he has been doing quite well from cardiology perspective. However since last year, he has fallen 3-4 times. The last fall was actually on the day of his last scheduled office visit with Dr. Martinique 2 weeks ago, he was coming up from the parking lot when he fell flat on his back. He did have pain in his back for more than a week however this has significantly improved. Fortunately he has not had any major fractures. Otherwise he denies any heart failure symptoms, lower extremity edema, orthopnea or PND. He denies any significant dizziness or syncope. He he had does have occasional chest discomfort, however they are very transient  and the he has been awhile since he had another episode. He lives in [place] with his 9 year old wife.     Past Medical History:  Diagnosis Date  . Allergic rhinitis   . Atrial fibrillation ([place])   . CIDP (chronic inflammatory demyelinating polyneuropathy) ([place])   . Colonic polyp   . H/O: CVA (cerebrovascular accident)   . Hyperbilirubinemia   . Hyperlipidemia   . Long term (current) use of anticoagulants   . Mitral regurgitation   . Nephrolithiasis   . Nevus, atypical   . Osteopenia   . Recurrent boils   . Unsteady gait     Past Surgical History:  Procedure Laterality Date  . APPENDECTOMY      Current Medications: Outpatient Medications Prior to Visit  Medication Sig Dispense Refill  . cetirizine (ZYRTEC) 10 MG tablet Take 10 mg by mouth as needed.     . metoprolol succinate (TOPROL XL) 100 MG 24 hr tablet Take 100 mg by mouth daily.     . pravastatin (PRAVACHOL) 20 MG tablet Take 20 mg by mouth daily.    Marland Kitchen warfarin (COUMADIN) 2 MG tablet Take 2 mg by mouth daily. TAKE 1/2 TABLET  3 times a week and 1 tablet 4 times a week.    Marland Kitchen alendronate (FOSAMAX) 70 MG tablet Take 70 mg by mouth every 7 (seven) days. Reported on 04/01/2015    . ciclopirox (LOPROX) 0.77 % cream Apply topically 2 (two) times daily. Reported on 04/01/2015    . urea (CARMOL) 40 % CREA  Reported on 04/01/2015     No facility-administered medications prior to visit.      Allergies:   Penicillins   Social History   Social History  . Marital status: Married    Spouse name: Rodney Malone  . Number of children: 2  . Years of education: Rodney Malone   Occupational History  . broadcasting    Social History Main Topics  . Smoking status: Former Smoker    Quit date: 10/06/1973  . Smokeless tobacco: Never Used  . Alcohol use Yes     Comment: usually have a couple of drinks a day  . Drug use: No  . Sexual activity: Not Asked   Other Topics Concern  . None   Social History Narrative   Patient is Married(Rodney Malone).  Retired. Lives in  single level home, Independent Living  section at [facility] since 2009.   Former smoker, minimal Alcohol history   Patient has Advanced planning documents: Living Will              Family History:  The patient's family history is not on file.   ROS:   Please see the history of present illness.    ROS All other systems reviewed and are negative.   PHYSICAL EXAM:   VS:  BP 129/71   Pulse 65   Ht 6' (1.829 m)   Wt 164 lb 12.8 oz (74.8 kg)   BMI 22.35 kg/m    GEN: Well nourished, well developed, in no acute distress  HEENT: normal  Neck: no JVD, carotid bruits, or masses Cardiac: irregular; no rubs, or gallops,no edema  3/6 systolic murmur at apex Respiratory:  clear to auscultation bilaterally, normal work of breathing GI: soft, nontender, nondistended, + BS MS: no deformity or atrophy  Skin: warm and dry, no rash Neuro:  Alert and Oriented x 3, Strength and sensation are intact Psych: euthymic mood, full affect  Wt Readings from Last 3 Encounters:  12/16/15 164 lb 12.8 oz (74.8 kg)  04/01/15 171 lb (77.6 kg)  02/20/15 172 lb (78 kg)      Studies/Labs Reviewed:   EKG:  EKG is ordered today.  The ekg ordered today demonstrates Atrial fibrillation, rate controlled, no significant ST-T wave changes.  Recent Labs: 02/20/2015: BUN 13; Creatinine, Ser 0.77; Hemoglobin 14.5; Platelets 170; Potassium 4.3; Sodium 139   Lipid Panel No results found for: CHOL, TRIG, HDL, CHOLHDL, VLDL, LDLCALC, LDLDIRECT  Additional studies/ records that were reviewed today include:   Echo 09/28/2013 LV EF: 60% -  65%  ------------------------------------------------------------------- Indications:   424.0 Mitral valve disease.  ------------------------------------------------------------------- History:  PMH: Acquired from the patient and from the patient&'s chart. Atrial fibrillation. Mitral regurgitation. Risk  factors: Dyslipidemia.  ------------------------------------------------------------------- Study Conclusions  - Left ventricle: The cavity size was at the upper limits of normal. Systolic function was normal. The estimated ejection fraction was in the range of 60% to 65%. Wall motion was normal; there were no regional wall motion abnormalities. - Mitral valve: Moderate flail motion involving the middle scallop of the posterior leaflet due to rupture of one or more chords. There was severe regurgitation directed eccentrically and toward the septum. - Left atrium: The atrium was severely dilated. - Right atrium: The atrium was moderately dilated. - Atrial septum: No defect or patent foramen ovale was identified. - Pulmonary arteries: Systolic pressure was moderately increased. PA peak pressure: 47 mm Hg (S).   ASSESSMENT:    1. Chronic atrial fibrillation ([place])   2.  Hyperlipidemia, unspecified hyperlipidemia type   3. Cerebrovascular accident (CVA), unspecified mechanism ([place])   4. Severe mitral regurgitation   5. CIDP (chronic inflammatory demyelinating polyneuropathy) ([facility])      PLAN:  In order of problems listed above:  1. Chronic atrial fibrillation - This patients CHA2DS2-VASc Score and unadjusted Ischemic Stroke Rate (% per year) is equal to 4.8 % stroke rate/year from a score of 4  Above score calculated as 1 point each if present [CHF, HTN, DM, Vascular=MI/PAD/Aortic Plaque, Age if 65-74, or Male] Above score calculated as 2 points each if present [Age > 75, or Stroke/TIA/TE] - Well controlled on Toprol-XL 100 mg daily. Per patient, he has been managing his Coumadin level at his PCPs office. - IM concern about his advanced age and his recurrent falls. According to the patient, he has imbalance issue secondary to CIDP and also his advanced age. He has fallen 3-4 times in the past year. Fortunately he has not had any fractures recently. At some point, we  need to think about the risk and benefit of Coumadin.  2. Severe MR: 3/6 systolic murmur at apex, review of the previous echocardiogram does show that his systolic function is preserved, he does have a flail mitral valve secondary to ruptured cord. Due to his advanced age, would not be a very good surgical candidate. Fortunately, he denies any heart failure symptoms. Given lack of heart failure symptoms, I will hold off on repeat echocardiogram at this time.  3. Hyperlipidemia: On pravastatin, he has not fasted today, we will recommend fasting lipid panel on follow-up in 6 months.  4. H/o CVA: No recent issue.  5. CIDP: Followed by neurology, he wears braces in bilateral leg, however continue to have balance issues.    Medication Adjustments/Labs and Tests Ordered: Current medicines are reviewed at length with the patient today.  Concerns regarding medicines are outlined above.  Medication changes, Labs and Tests ordered today are listed in the Patient Instructions below. Patient Instructions  Medication Instructions:   Your physician recommends that you continue on your current medications as directed. Please refer to the Current Medication list given to you today.     If you need a refill on your cardiac medications before your next appointment, please call your pharmacy.  Labwork: RETURN FOR FASTING LABS IN THE AM    Testing/Procedures: NONE ORDER TODAY    Follow-Up: Your physician wants you to follow-up in:  IN 6   MONTHS WITH DR Martinique   You will receive a reminder letter in the mail two months in advance. If you don't receive a letter, please call our office to schedule the follow-up appointment.      Any Other Special Instructions Will Be Listed Below (If Applicable).  Hilbert Corrigan, [place]  12/16/2015 10:37 PM     [facility] [place], [place], [place]  [address] Phone: [phone]; Fax: [phone]

## 2016-01-05 DIAGNOSIS — M109 Gout, unspecified: Secondary | ICD-10-CM | POA: Diagnosis not present

## 2016-01-05 DIAGNOSIS — Z125 Encounter for screening for malignant neoplasm of prostate: Secondary | ICD-10-CM | POA: Diagnosis not present

## 2016-01-05 DIAGNOSIS — E784 Other hyperlipidemia: Secondary | ICD-10-CM | POA: Diagnosis not present

## 2016-01-05 DIAGNOSIS — M859 Disorder of bone density and structure, unspecified: Secondary | ICD-10-CM | POA: Diagnosis not present

## 2016-01-26 DIAGNOSIS — I480 Paroxysmal atrial fibrillation: Secondary | ICD-10-CM | POA: Diagnosis not present

## 2016-01-26 DIAGNOSIS — Z7901 Long term (current) use of anticoagulants: Secondary | ICD-10-CM | POA: Diagnosis not present

## 2016-02-29 ENCOUNTER — Ambulatory Visit (INDEPENDENT_AMBULATORY_CARE_PROVIDER_SITE_OTHER): Payer: Medicare Other | Admitting: Podiatry

## 2016-02-29 ENCOUNTER — Encounter: Payer: Self-pay | Admitting: Podiatry

## 2016-02-29 VITALS — BP 118/61 | HR 59 | Temp 98.6°F | Resp 18

## 2016-02-29 DIAGNOSIS — B351 Tinea unguium: Secondary | ICD-10-CM

## 2016-02-29 DIAGNOSIS — M79676 Pain in unspecified toe(s): Secondary | ICD-10-CM

## 2016-03-01 DIAGNOSIS — I480 Paroxysmal atrial fibrillation: Secondary | ICD-10-CM | POA: Diagnosis not present

## 2016-03-01 DIAGNOSIS — Z7901 Long term (current) use of anticoagulants: Secondary | ICD-10-CM | POA: Diagnosis not present

## 2016-03-01 NOTE — Progress Notes (Signed)
Patient ID: Rodney Malone, male   DOB: 1922/05/28, 82 y.o.   MRN: [id number]    Subjective: This patient presents again complaining of thick, elongated toenails with direct shoe pressure and walking and is requesting toenail debridement  Objective: Orientated 3 Bilateral AFOs DP and PT pulses 2/4 bilaterally Capillary Reflex immediate bilaterally General rubor bilaterally Dorsi flexion and plantar flexion 0/4 bilaterally Ankle reflexes weakly reactive bilaterally Unsteady gait pattern walking with walker Hammertoe 2-4 left No open skin lesions bilaterally Atrophic skin bilaterally The toenails are elongated, brittle, discolored, hypertrophic and tender direct palpation 6-10  Assessment: Symptomatic onychomycoses 6-10 History of chronic inflammatory demyelinating polyneuropathy  Plan: Debridement toenails 10 mechanically and electrically without any bleeding  Reappoint 3 months

## 2016-03-27 ENCOUNTER — Ambulatory Visit (INDEPENDENT_AMBULATORY_CARE_PROVIDER_SITE_OTHER): Payer: Medicare Other | Admitting: Neurology

## 2016-03-27 ENCOUNTER — Encounter: Payer: Self-pay | Admitting: Neurology

## 2016-03-27 VITALS — BP 120/76 | HR 61 | Ht 72.0 in | Wt 164.1 lb

## 2016-03-27 DIAGNOSIS — G6181 Chronic inflammatory demyelinating polyneuritis: Secondary | ICD-10-CM | POA: Diagnosis not present

## 2016-03-27 DIAGNOSIS — R413 Other amnesia: Secondary | ICD-10-CM

## 2016-03-27 NOTE — Progress Notes (Signed)
NEUROLOGY FOLLOW UP OFFICE NOTE  Rodney Malone IW:[id number]  HISTORY OF PRESENT ILLNESS: I had the pleasure of seeing Rodney Malone in follow-up in the neurology clinic on 03/27/2016.  The patient was last seen a year ago for CIDP and memory loss. Since his last visit, he reports doing well. He had 1 bad fall in the past year while at [employer], injuring his right hip. He denies any loss of conscousness. He has had around 4-5 other minor falls as well. He walks with a walker and denies any change in leg weakness. He feels his hands are strong. He reports his memory is "bad." He is not driving anymore. He has occasional dizziness, occasional numbness in his hands and feet. No dysarthria/dysphagia, neck pain, bowel/bladder dysfunction.   HPI: This is a very pleasant 83 yo LH man with a history of atrial fibrillation on anticoagulation with Coumadin, right frontal stroke in 2004 with no residual deficits, hyperlipidemia, and chronic inflammatory demyelinating polyneuropathy (CIDP) diagnosed around 15 years ago. He recalls symptoms started after he stumbled and broke 2 toes on the left feet, followed by weakness in both feet and legs. He has been wearing AFOs for the past 10 years. He reports numbness and tingling in both feet up to his ankles. He also has numbness and tingling in both hands and has has 4 different surgeries for Dupuytren's contractures. He reports going to [facility] for several years but was told to "just stay in [place] because we can't do anything for you." The last time he did PT was 7 years ago.  Records from [facility] from 2004 to 2011 were reviewed. He was seeing neuromuscular specialist Dr. Roselyn Meier. 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 He was tried on multiple therapies, including IVIG, plasmapharesis, Cellcept, and steroids without major benefit. They  had tried Lyrica and Cymbalta for the dysesthesias but had side effects of nausea. In 2009, he was noted to have a violaceous discoloration in both feet that appeared to be venostasis. The patient does not recall trying the different therapies.  He has been evaluated by our neuromuscular specialist Dr. Posey Pronto for CIDP. Per Dr. Serita Grit note, "he has been clinically stable for 10+ years, suggesting that the disease in in remission. Based on the severity of his neuropathy, there is probably very little demyelinating neuropathy and it has all become axon loss so there is really no nerves left to salvage in the legs." Since there is no progression of symptoms, there is no utility to trying other therapies or doing additional work-up. Dr. Posey Pronto expressed concern about driving and fall risk while on Coumadin. [name] tells me that after his visit, his children have gotten involved and he has stopped driving. He uses his walker at all times. He denies any headaches.   Diagnostic Data: Head CT done 07/14/13 personally reviewed showing right frontal encephalomalacia, no acute findings.  PAST MEDICAL HISTORY: Past Medical History:  Diagnosis Date  . Allergic rhinitis   . Atrial fibrillation ([place])   . CIDP (chronic inflammatory demyelinating polyneuropathy) ([place])   . Colonic polyp   . H/O: CVA (cerebrovascular accident)   . Hyperbilirubinemia   . Hyperlipidemia   . Long term (current) use of anticoagulants   . Mitral regurgitation   . Nephrolithiasis   . Nevus, atypical   . Osteopenia   . Recurrent boils   . Unsteady gait  MEDICATIONS: Current Outpatient Prescriptions on File Prior to Visit  Medication Sig Dispense Refill  . cetirizine (ZYRTEC) 10 MG tablet Take 10 mg by mouth as needed.     . metoprolol succinate (TOPROL XL) 100 MG 24 hr tablet Take 100 mg by mouth daily.     . pravastatin (PRAVACHOL) 20 MG tablet Take 20 mg by mouth daily.    Marland Kitchen warfarin (COUMADIN) 2 MG tablet Take 2 mg by  mouth daily. TAKE 1/2 TABLET  3 times a week and 1 tablet 4 times a week.     No current facility-administered medications on file prior to visit.     ALLERGIES: Allergies  Allergen Reactions  . Penicillins Other (See Comments)    UNKNOWN    FAMILY HISTORY: No family history on file.  SOCIAL HISTORY: Social History   Social History  . Marital status: Married    Spouse name: N/A  . Number of children: 2  . Years of education: N/A   Occupational History  . broadcasting    Social History Main Topics  . Smoking status: Former Smoker    Quit date: 10/06/1973  . Smokeless tobacco: Never Used  . Alcohol use Yes     Comment: usually have a couple of drinks a day  . Drug use: No  . Sexual activity: Not on file   Other Topics Concern  . Not on file   Social History Narrative   Patient is Married([name]). Retired. Lives in  single level home, Independent Living  section at [place] since 2009.   Former smoker, minimal Alcohol history   Patient has Advanced planning documents: Living Will             REVIEW OF SYSTEMS: Constitutional: No fevers, chills, or sweats, no generalized fatigue, change in appetite Eyes: No visual changes, double vision, eye pain Ear, nose and throat: No hearing loss, ear pain, nasal congestion, sore throat Cardiovascular: No chest pain, palpitations Respiratory:  No shortness of breath at rest or with exertion, wheezes GastrointestinaI: No nausea, vomiting, diarrhea, abdominal pain, fecal incontinence Genitourinary:  No dysuria, urinary retention or frequency Musculoskeletal:  No neck pain,+ back pain Integumentary: No rash, pruritus, skin lesions Neurological: as above Psychiatric: No depression, insomnia, anxiety Endocrine: No palpitations, fatigue, diaphoresis, mood swings, change in appetite, change in weight, increased thirst Hematologic/Lymphatic:  No anemia, purpura, petechiae. Allergic/Immunologic: no itchy/runny  eyes, nasal congestion, recent allergic reactions, rashes  PHYSICAL EXAM: Vitals:   03/27/16 1119  BP: 120/76  Pulse: 61   General: No acute distress Head:  Normocephalic/atraumatic Neck: supple, no paraspinal tenderness, full range of motion Heart:  Regular rate and rhythm Lungs:  Clear to auscultation bilaterally Back: No paraspinal tenderness Skin/Extremities: No rash, no edema Neurological Exam: alert and oriented to person, place, and time. No aphasia or dysarthria. Fund of knowledge is appropriate.  Remote memory intact.  Attention and concentration are normal.    Able to name objects and repeat phrases. CDT 5/5  MMSE - Mini Mental State Exam 03/27/2016 04/01/2015 08/30/2014 02/24/2014  Orientation to time 5 4 4 5   Orientation to Place 5 5 5 5   Registration 3 3 3 3   Attention/ Calculation 5 5 5 5   Recall 0 1 3 3   Language- name 2 objects 2 2 2 2   Language- repeat 1 1 1 1   Language- follow 3 step command 2 3 2 3   Language- read & follow direction 1 1 1 1   Write a sentence 1  1 1 1   Copy design 0 1 1 1   Total score 25 27 28 30    Cranial nerves: Pupils equal, round, reactive to light.  Extraocular movements intact with no nystagmus. Visual fields full. Facial sensation intact. No facial asymmetry. Tongue, uvula, palate midline.  Motor: Bulk and tone normal, muscle strength 5/5 on both UE with contractures in both hands, 5/5 proximal bilateral LE (hip flexors, extensors, abduction/adduction), 5/5 bilateral knee flexion/extension, 0/5 bilateral dorsiflexion, plantarflexion, toe extension. (similar to prior visit). No pronator drift.  Sensation decreased in both LE.  Deep tendon reflexes 2+ on both UE, unable to elicit reflexes in both LE. Finger to nose testing intact.  Gait with walker slow and cautious with AFOs bilaterally, no ataxia.  IMPRESSION: This is a very pleasant 83 yo LH man with a history of atrial fibrillation on Coumadin, right frontal stroke with no residual deficits,  hyperlipidemia, and CIDP with bilateral foot drop. He has been to [facility] and has been evaluated by our neuromuscular specialist Dr. Posey Pronto as well for the CIDP, at this point he has been clinically stable and is likely in remission. His neurological exam is unchanged. MMSE today is 25/30, a decline from his last visit. He declines starting Aricept. We again discussed the importance of physical exercise and brain stimulation exercises for brain health. We again discussed his fall risk with CIDP/bilateral foot drop while on anticoagulation, per last Cardiology note by Almyra Deforest, PA in October, risk and benefit of Coumadin needs to be reconsidered. He will follow-up with Cardiology to discuss this. He knows to use his walker at all times. He is not driving. He will follow-up in 1 year and knows to call us for any changes.   Thank you for allowing me to participate in his care.  Please do not hesitate to call for any questions or concerns.  The duration of this appointment visit was 25 minutes of face-to-face time with the patient.  Greater than 50% of this time was spent in counseling, explanation of diagnosis, planning of further management, and coordination of care.   Ellouise Newer, M.D.   CC: Dr. Brigitte Pulse, Almyra Deforest PA, Dr. Peter Martinique

## 2016-03-27 NOTE — Patient Instructions (Signed)
You look great! Continue all your medications. Discuss the coumadin with your cardiologist in light of the frequent falls. Continue using walker at all times. Follow-up in 1 year, take care, it was great seeing you!

## 2016-04-03 DIAGNOSIS — R2689 Other abnormalities of gait and mobility: Secondary | ICD-10-CM | POA: Diagnosis not present

## 2016-04-03 DIAGNOSIS — Z9181 History of falling: Secondary | ICD-10-CM | POA: Diagnosis not present

## 2016-04-03 DIAGNOSIS — G609 Hereditary and idiopathic neuropathy, unspecified: Secondary | ICD-10-CM | POA: Diagnosis not present

## 2016-04-03 DIAGNOSIS — M6389 Disorders of muscle in diseases classified elsewhere, multiple sites: Secondary | ICD-10-CM | POA: Diagnosis not present

## 2016-04-03 DIAGNOSIS — R278 Other lack of coordination: Secondary | ICD-10-CM | POA: Diagnosis not present

## 2016-04-05 DIAGNOSIS — I480 Paroxysmal atrial fibrillation: Secondary | ICD-10-CM | POA: Diagnosis not present

## 2016-04-05 DIAGNOSIS — Z7901 Long term (current) use of anticoagulants: Secondary | ICD-10-CM | POA: Diagnosis not present

## 2016-04-08 ENCOUNTER — Inpatient Hospital Stay (HOSPITAL_COMMUNITY): Payer: Medicare Other

## 2016-04-08 ENCOUNTER — Emergency Department (HOSPITAL_COMMUNITY): Payer: Medicare Other

## 2016-04-08 ENCOUNTER — Encounter (HOSPITAL_COMMUNITY): Payer: Self-pay

## 2016-04-08 ENCOUNTER — Inpatient Hospital Stay (HOSPITAL_COMMUNITY)
Admission: EM | Admit: 2016-04-08 | Discharge: 2016-04-12 | DRG: 871 | Disposition: A | Discharge status: Skilled Nursing Facility | Payer: Medicare Other | Attending: Internal Medicine | Admitting: Internal Medicine

## 2016-04-08 DIAGNOSIS — I080 Rheumatic disorders of both mitral and aortic valves: Secondary | ICD-10-CM | POA: Diagnosis present

## 2016-04-08 DIAGNOSIS — K5900 Constipation, unspecified: Secondary | ICD-10-CM | POA: Diagnosis not present

## 2016-04-08 DIAGNOSIS — R509 Fever, unspecified: Secondary | ICD-10-CM | POA: Diagnosis not present

## 2016-04-08 DIAGNOSIS — J810 Acute pulmonary edema: Secondary | ICD-10-CM | POA: Diagnosis present

## 2016-04-08 DIAGNOSIS — R531 Weakness: Secondary | ICD-10-CM | POA: Diagnosis not present

## 2016-04-08 DIAGNOSIS — J159 Unspecified bacterial pneumonia: Secondary | ICD-10-CM | POA: Diagnosis present

## 2016-04-08 DIAGNOSIS — R05 Cough: Secondary | ICD-10-CM | POA: Diagnosis not present

## 2016-04-08 DIAGNOSIS — M79672 Pain in left foot: Secondary | ICD-10-CM

## 2016-04-08 DIAGNOSIS — D649 Anemia, unspecified: Secondary | ICD-10-CM | POA: Diagnosis not present

## 2016-04-08 DIAGNOSIS — J1008 Influenza due to other identified influenza virus with other specified pneumonia: Secondary | ICD-10-CM | POA: Diagnosis present

## 2016-04-08 DIAGNOSIS — J111 Influenza due to unidentified influenza virus with other respiratory manifestations: Secondary | ICD-10-CM | POA: Diagnosis not present

## 2016-04-08 DIAGNOSIS — E785 Hyperlipidemia, unspecified: Secondary | ICD-10-CM | POA: Diagnosis present

## 2016-04-08 DIAGNOSIS — J1100 Influenza due to unidentified influenza virus with unspecified type of pneumonia: Secondary | ICD-10-CM

## 2016-04-08 DIAGNOSIS — I341 Nonrheumatic mitral (valve) prolapse: Secondary | ICD-10-CM | POA: Diagnosis present

## 2016-04-08 DIAGNOSIS — Z7901 Long term (current) use of anticoagulants: Secondary | ICD-10-CM

## 2016-04-08 DIAGNOSIS — J9601 Acute respiratory failure with hypoxia: Secondary | ICD-10-CM | POA: Diagnosis present

## 2016-04-08 DIAGNOSIS — I248 Other forms of acute ischemic heart disease: Secondary | ICD-10-CM | POA: Diagnosis present

## 2016-04-08 DIAGNOSIS — R6521 Severe sepsis with septic shock: Secondary | ICD-10-CM | POA: Diagnosis present

## 2016-04-08 DIAGNOSIS — R0902 Hypoxemia: Secondary | ICD-10-CM

## 2016-04-08 DIAGNOSIS — I340 Nonrheumatic mitral (valve) insufficiency: Secondary | ICD-10-CM | POA: Diagnosis present

## 2016-04-08 DIAGNOSIS — I4891 Unspecified atrial fibrillation: Secondary | ICD-10-CM | POA: Diagnosis not present

## 2016-04-08 DIAGNOSIS — Z87891 Personal history of nicotine dependence: Secondary | ICD-10-CM | POA: Diagnosis not present

## 2016-04-08 DIAGNOSIS — M8580 Other specified disorders of bone density and structure, unspecified site: Secondary | ICD-10-CM | POA: Diagnosis present

## 2016-04-08 DIAGNOSIS — A419 Sepsis, unspecified organism: Secondary | ICD-10-CM | POA: Diagnosis not present

## 2016-04-08 DIAGNOSIS — G6181 Chronic inflammatory demyelinating polyneuritis: Secondary | ICD-10-CM | POA: Diagnosis present

## 2016-04-08 DIAGNOSIS — N3000 Acute cystitis without hematuria: Secondary | ICD-10-CM

## 2016-04-08 DIAGNOSIS — R739 Hyperglycemia, unspecified: Secondary | ICD-10-CM | POA: Diagnosis present

## 2016-04-08 DIAGNOSIS — Z880 Allergy status to penicillin: Secondary | ICD-10-CM | POA: Diagnosis not present

## 2016-04-08 DIAGNOSIS — I272 Pulmonary hypertension, unspecified: Secondary | ICD-10-CM | POA: Diagnosis present

## 2016-04-08 DIAGNOSIS — I051 Rheumatic mitral insufficiency: Secondary | ICD-10-CM

## 2016-04-08 DIAGNOSIS — J80 Acute respiratory distress syndrome: Secondary | ICD-10-CM | POA: Diagnosis not present

## 2016-04-08 DIAGNOSIS — Z8673 Personal history of transient ischemic attack (TIA), and cerebral infarction without residual deficits: Secondary | ICD-10-CM | POA: Diagnosis not present

## 2016-04-08 LAB — BLOOD GAS, ARTERIAL
ACID-BASE DEFICIT: 5.4 mmol/L — AB (ref 0.0–2.0)
Bicarbonate: 18 mmol/L — ABNORMAL LOW (ref 20.0–28.0)
DELIVERY SYSTEMS: POSITIVE
Drawn by: 257701
EXPIRATORY PAP: 5
FIO2: 100
INSPIRATORY PAP: 10
Mode: POSITIVE
O2 Saturation: 100 %
PATIENT TEMPERATURE: 98.3
PH ART: 7.396 (ref 7.350–7.450)
pCO2 arterial: 29.8 mmHg — ABNORMAL LOW (ref 32.0–48.0)
pO2, Arterial: 343 mmHg — ABNORMAL HIGH (ref 83.0–108.0)

## 2016-04-08 LAB — INFLUENZA PANEL BY PCR (TYPE A & B)
INFLAPCR: POSITIVE — AB
Influenza B By PCR: NEGATIVE

## 2016-04-08 LAB — COMPREHENSIVE METABOLIC PANEL
ALT: 15 U/L — AB (ref 17–63)
ANION GAP: 10 (ref 5–15)
AST: 47 U/L — ABNORMAL HIGH (ref 15–41)
Albumin: 4.5 g/dL (ref 3.5–5.0)
Alkaline Phosphatase: 59 U/L (ref 38–126)
BUN: 19 mg/dL (ref 6–20)
CHLORIDE: 105 mmol/L (ref 101–111)
CO2: 24 mmol/L (ref 22–32)
CREATININE: 0.84 mg/dL (ref 0.61–1.24)
Calcium: 9.3 mg/dL (ref 8.9–10.3)
GFR calc Af Amer: >60 mL/min (ref >60)
GFR calc non Af Amer: >60 mL/min (ref >60)
Glucose, Bld: 145 mg/dL — ABNORMAL HIGH (ref 65–99)
POTASSIUM: 3.8 mmol/L (ref 3.5–5.1)
SODIUM: 139 mmol/L (ref 135–145)
Total Bilirubin: 2 mg/dL — ABNORMAL HIGH (ref 0.3–1.2)
Total Protein: 7.3 g/dL (ref 6.5–8.1)

## 2016-04-08 LAB — URINALYSIS, ROUTINE W REFLEX MICROSCOPIC
Bilirubin Urine: NEGATIVE
GLUCOSE, UA: 250 mg/dL — AB
KETONES UR: 40 mg/dL — AB
NITRITE: POSITIVE — AB
PH: 5 (ref 5.0–8.0)
Protein, ur: >300 mg/dL — AB
SPECIFIC GRAVITY, URINE: 1.025 (ref 1.005–1.030)

## 2016-04-08 LAB — CBC WITH DIFFERENTIAL/PLATELET
BASOS ABS: 0 10*3/uL (ref 0.0–0.1)
Basophils Relative: 0 %
EOS PCT: 0 %
Eosinophils Absolute: 0 10*3/uL (ref 0.0–0.7)
HEMATOCRIT: 42.9 % (ref 39.0–52.0)
HEMOGLOBIN: 14 g/dL (ref 13.0–17.0)
LYMPHS PCT: 15 %
Lymphs Abs: 2.2 10*3/uL (ref 0.7–4.0)
MCH: 30.9 pg (ref 26.0–34.0)
MCHC: 32.6 g/dL (ref 30.0–36.0)
MCV: 94.7 fL (ref 78.0–100.0)
MONOS PCT: 13 %
Monocytes Absolute: 1.9 10*3/uL — ABNORMAL HIGH (ref 0.1–1.0)
NEUTROS PCT: 72 %
Neutro Abs: 10.5 10*3/uL — ABNORMAL HIGH (ref 1.7–7.7)
Platelets: 169 10*3/uL (ref 150–400)
RBC: 4.53 MIL/uL (ref 4.22–5.81)
RDW: 14.8 % (ref 11.5–15.5)
WBC: 14.6 10*3/uL — AB (ref 4.0–10.5)

## 2016-04-08 LAB — MRSA PCR SCREENING: MRSA BY PCR: NEGATIVE

## 2016-04-08 LAB — CG4 I-STAT (LACTIC ACID): LACTIC ACID, VENOUS: 3.69 mmol/L — AB (ref 0.5–1.9)

## 2016-04-08 LAB — LIPASE, BLOOD: Lipase: 26 U/L (ref 11–51)

## 2016-04-08 LAB — PROTIME-INR
INR: 2.33
Prothrombin Time: 26 seconds — ABNORMAL HIGH (ref 11.4–15.2)

## 2016-04-08 LAB — TROPONIN I
Troponin I: 1.57 ng/mL (ref <0.03)
Troponin I: 2.15 ng/mL (ref <0.03)

## 2016-04-08 LAB — URINALYSIS, MICROSCOPIC (REFLEX): Squamous Epithelial / LPF: NONE SEEN

## 2016-04-08 LAB — I-STAT TROPONIN, ED: TROPONIN I, POC: 0.06 ng/mL (ref 0.00–0.08)

## 2016-04-08 LAB — I-STAT CG4 LACTIC ACID, ED: Lactic Acid, Venous: 3.25 mmol/L (ref 0.5–1.9)

## 2016-04-08 MED ORDER — ACETAMINOPHEN 650 MG RE SUPP
650.0000 mg | Freq: Once | RECTAL | Status: AC
  Administered 2016-04-08: 650 mg via RECTAL
  Filled 2016-04-08: qty 1

## 2016-04-08 MED ORDER — LEVOFLOXACIN IN D5W 750 MG/150ML IV SOLN
750.0000 mg | Freq: Once | INTRAVENOUS | Status: AC
Start: 2016-04-08 — End: 2016-04-08
  Administered 2016-04-08: 750 mg via INTRAVENOUS
  Filled 2016-04-08: qty 150

## 2016-04-08 MED ORDER — MORPHINE SULFATE (PF) 2 MG/ML IV SOLN
2.0000 mg | INTRAVENOUS | Status: DC | PRN
  Administered 2016-04-08 – 2016-04-10 (×2): 2 mg via INTRAVENOUS
  Filled 2016-04-08 (×2): qty 1

## 2016-04-08 MED ORDER — VANCOMYCIN HCL IN DEXTROSE 750-5 MG/150ML-% IV SOLN
750.0000 mg | Freq: Two times a day (BID) | INTRAVENOUS | Status: DC
  Filled 2016-04-08: qty 150

## 2016-04-08 MED ORDER — ACETAMINOPHEN 325 MG PO TABS
650.0000 mg | ORAL_TABLET | Freq: Once | ORAL | Status: DC
  Filled 2016-04-08: qty 2

## 2016-04-08 MED ORDER — ORAL CARE MOUTH RINSE
15.0000 mL | Freq: Two times a day (BID) | OROMUCOSAL | Status: DC
  Administered 2016-04-08 – 2016-04-12 (×6): 15 mL via OROMUCOSAL

## 2016-04-08 MED ORDER — DEXTROSE 5 % IV SOLN
1.0000 g | INTRAVENOUS | Status: DC
  Administered 2016-04-08 – 2016-04-11 (×4): 1 g via INTRAVENOUS
  Filled 2016-04-08 (×5): qty 10

## 2016-04-08 MED ORDER — DEXTROSE 5 % IV SOLN
2.0000 g | Freq: Once | INTRAVENOUS | Status: AC
  Administered 2016-04-08: 2 g via INTRAVENOUS
  Filled 2016-04-08: qty 2

## 2016-04-08 MED ORDER — SODIUM CHLORIDE 0.9% FLUSH
3.0000 mL | Freq: Two times a day (BID) | INTRAVENOUS | Status: DC
  Administered 2016-04-08 – 2016-04-12 (×7): 3 mL via INTRAVENOUS

## 2016-04-08 MED ORDER — AMIODARONE IV BOLUS ONLY 150 MG/100ML
150.0000 mg | Freq: Once | INTRAVENOUS | Status: AC
  Administered 2016-04-08: 150 mg via INTRAVENOUS
  Filled 2016-04-08: qty 100

## 2016-04-08 MED ORDER — SODIUM CHLORIDE 0.9 % IV BOLUS (SEPSIS)
500.0000 mL | Freq: Once | INTRAVENOUS | Status: AC
  Administered 2016-04-08: 500 mL via INTRAVENOUS

## 2016-04-08 MED ORDER — SODIUM CHLORIDE 0.9 % IV BOLUS (SEPSIS)
250.0000 mL | Freq: Once | INTRAVENOUS | Status: AC
  Administered 2016-04-08: 250 mL via INTRAVENOUS

## 2016-04-08 MED ORDER — LEVOFLOXACIN IN D5W 750 MG/150ML IV SOLN: 750.0000 mg | INTRAVENOUS | Status: DC

## 2016-04-08 MED ORDER — METOPROLOL SUCCINATE ER 25 MG PO TB24
100.0000 mg | ORAL_TABLET | Freq: Every day | ORAL | Status: DC
  Filled 2016-04-08: qty 1

## 2016-04-08 MED ORDER — DILTIAZEM HCL 25 MG/5ML IV SOLN: 2.5000 mg | Freq: Once | INTRAVENOUS | Status: DC

## 2016-04-08 MED ORDER — SODIUM CHLORIDE 0.9 % IV BOLUS (SEPSIS)
1000.0000 mL | Freq: Once | INTRAVENOUS | Status: AC
  Administered 2016-04-08: 1000 mL via INTRAVENOUS

## 2016-04-08 MED ORDER — VANCOMYCIN HCL IN DEXTROSE 1-5 GM/200ML-% IV SOLN
1000.0000 mg | Freq: Once | INTRAVENOUS | Status: AC
Start: 2016-04-08 — End: 2016-04-08
  Administered 2016-04-08: 1000 mg via INTRAVENOUS
  Filled 2016-04-08: qty 200

## 2016-04-08 MED ORDER — DILTIAZEM HCL-DEXTROSE 100-5 MG/100ML-% IV SOLN (PREMIX)
5.0000 mg/h | INTRAVENOUS | Status: DC
  Administered 2016-04-08: 5 mg/h via INTRAVENOUS
  Administered 2016-04-08: 10 mg/h via INTRAVENOUS
  Administered 2016-04-08: 5 mg/h via INTRAVENOUS
  Filled 2016-04-08: qty 100

## 2016-04-08 MED ORDER — DILTIAZEM LOAD VIA INFUSION
5.0000 mg | Freq: Once | INTRAVENOUS | Status: AC
  Administered 2016-04-08: 5 mg via INTRAVENOUS
  Filled 2016-04-08: qty 5

## 2016-04-08 MED ORDER — OSELTAMIVIR PHOSPHATE 75 MG PO CAPS
75.0000 mg | ORAL_CAPSULE | Freq: Two times a day (BID) | ORAL | Status: DC
Start: 2016-04-08 — End: 2016-04-09
  Administered 2016-04-08 – 2016-04-09 (×2): 75 mg via ORAL
  Filled 2016-04-08 (×2): qty 1

## 2016-04-08 MED ORDER — DEXTROSE 5 % IV SOLN
1.0000 g | Freq: Three times a day (TID) | INTRAVENOUS | Status: DC
  Filled 2016-04-08: qty 1

## 2016-04-08 MED ORDER — ACETAMINOPHEN 500 MG PO TABS
1000.0000 mg | ORAL_TABLET | Freq: Once | ORAL | Status: DC
  Filled 2016-04-08: qty 2

## 2016-04-08 MED ORDER — ENOXAPARIN SODIUM 80 MG/0.8ML ~~LOC~~ SOLN
70.0000 mg | Freq: Once | SUBCUTANEOUS | Status: AC
  Administered 2016-04-09: 70 mg via SUBCUTANEOUS
  Filled 2016-04-08: qty 0.8

## 2016-04-08 MED ORDER — WARFARIN SODIUM 2 MG PO TABS
2.0000 mg | ORAL_TABLET | Freq: Once | ORAL | Status: AC
  Administered 2016-04-08: 2 mg via ORAL
  Filled 2016-04-08 (×2): qty 1

## 2016-04-08 MED ORDER — WARFARIN - PHARMACIST DOSING INPATIENT: Freq: Every day | Status: DC

## 2016-04-08 NOTE — Progress Notes (Signed)
Dr. Almyra Free paged about critical troponin 2.15. No new orders received at this time.

## 2016-04-08 NOTE — ED Provider Notes (Signed)
Emergency Department Provider Note   I have reviewed the triage vital signs and the nursing notes.   HISTORY  Chief Complaint Fall and Fever   HPI Rodney Malone is a 83 y.o. male with PMH of a-fib on coumadin, CIPD, HLD, and MR who presents to the emergency department for evaluation of weakness, URI symptoms, 8 weeks of cough, and slip and fall when getting out of bed this morning. Patient wife states he's been feeling bad for several days complaining of neuropathy pain in his left leg. Patient was trying to get a bed this morning when he slipped and fell to the floor. He tried eat up for long time his wife try to help and that they're unable to get him off the ground so they called the fire department. The patient denies any chest pain or difficult to breathing. His not had fevers. No head trauma. Patient denies pain in the arms or legs after the fall but his wife thinks he may have caused more pain in the left leg and trying to get up.   Patient was hypoxemic on arrival into the mid to low 80s on room air was started on oxygen in the field.    Past Medical History:  Diagnosis Date  . Allergic rhinitis   . Atrial fibrillation ([place])   . CIDP (chronic inflammatory demyelinating polyneuropathy) ([place])   . Colonic polyp   . H/O: CVA (cerebrovascular accident)   . Hyperbilirubinemia   . Hyperlipidemia   . Long term (current) use of anticoagulants   . Mitral regurgitation   . Nephrolithiasis   . Nevus, atypical   . Osteopenia   . Recurrent boils   . Unsteady gait     Patient Active Problem List   Diagnosis Date Noted  . Atrial fibrillation with RVR ([place]) 04/08/2016  . Fever   . Acute cystitis without hematuria   . Sepsis ([place])   . Memory loss 04/05/2015  . Mild closed head injury 07/15/2013  . CIDP (chronic inflammatory demyelinating polyneuropathy) ([place])   . Unsteady gait   . Long term current use of anticoagulant therapy   . Mitral regurgitation due to cusp prolapse     . Atrial fibrillation ([place])   . Hyperlipidemia     Past Surgical History:  Procedure Laterality Date  . APPENDECTOMY        Allergies Penicillins  History reviewed. No pertinent family history.  Social History Social History  Substance Use Topics  . Smoking status: Former Smoker    Quit date: 10/06/1973  . Smokeless tobacco: Never Used  . Alcohol use Yes     Comment: usually have a couple of drinks a day    Review of Systems  Constitutional: No fever/chills Eyes: No visual changes. ENT: No sore throat. Positive runny nose.  Cardiovascular: Denies chest pain. Respiratory: Denies shortness of breath. Positive cough. Gastrointestinal: No abdominal pain.  No nausea, no vomiting.  No diarrhea.  No constipation. Genitourinary: Negative for dysuria. Musculoskeletal: Negative for back pain. Skin: Negative for rash. Neurological: Negative for headaches, focal weakness or numbness. LLE neuropathy pain.   10-point ROS otherwise negative.  ____________________________________________   PHYSICAL EXAM:  VITAL SIGNS: ED Triage Vitals  Enc Vitals Group     BP 04/08/16 0555 132/80     Pulse Rate 04/08/16 0555 113     Resp 04/08/16 0555 26     Temp 04/08/16 0555 98.6 F (37 C)     Temp Source 04/08/16 0555 Oral  SpO2 04/08/16 0555 98 %     Pain Score 04/08/16 0553 9   Constitutional: Ill-appearing Eyes: Conjunctivae are normal.  Head: Atraumatic. Nose: No congestion/rhinnorhea. Mouth/Throat: Mucous membranes are dry.  Neck: No stridor.   Cardiovascular: A-fib RVR. Good peripheral circulation. Grossly normal heart sounds.   Respiratory: Increased respiratory effort.  No retractions. Lungs with faint crackles at the bases.  Gastrointestinal: Soft and nontender. No distention.  Musculoskeletal: No lower extremity tenderness nor edema. No gross deformities of extremities. Neurologic:  Normal speech and language. No gross focal neurologic deficits are appreciated.   Skin:  Skin is warm, dry and intact. No rash noted.   ____________________________________________   LABS (all labs ordered are listed, but only abnormal results are displayed)  Labs Reviewed  COMPREHENSIVE METABOLIC PANEL - Abnormal; Notable for the following:       Result Value   Glucose, Bld 145 (*)    AST 47 (*)    ALT 15 (*)    Total Bilirubin 2.0 (*)    All other components within normal limits  CBC WITH DIFFERENTIAL/PLATELET - Abnormal; Notable for the following:    WBC 14.6 (*)    Neutro Abs 10.5 (*)    Monocytes Absolute 1.9 (*)    All other components within normal limits  PROTIME-INR - Abnormal; Notable for the following:    Prothrombin Time 26.0 (*)    All other components within normal limits  INFLUENZA PANEL BY PCR (TYPE A & B) - Abnormal; Notable for the following:    Influenza A By PCR POSITIVE (*)    All other components within normal limits  URINALYSIS, ROUTINE W REFLEX MICROSCOPIC - Abnormal; Notable for the following:    Color, Urine RED (*)    Glucose, UA 250 (*)    Hgb urine dipstick LARGE (*)    Ketones, ur 40 (*)    Protein, ur >300 (*)    Nitrite POSITIVE (*)    Leukocytes, UA MODERATE (*)    All other components within normal limits  URINALYSIS, MICROSCOPIC (REFLEX) - Abnormal; Notable for the following:    Bacteria, UA FEW (*)    All other components within normal limits  BLOOD GAS, ARTERIAL - Abnormal; Notable for the following:    pCO2 arterial 29.8 (*)    pO2, Arterial 343 (*)    Bicarbonate 18.0 (*)    Acid-base deficit 5.4 (*)    All other components within normal limits  TROPONIN I - Abnormal; Notable for the following:    Troponin I 1.57 (*)    All other components within normal limits  I-STAT CG4 LACTIC ACID, ED - Abnormal; Notable for the following:    Lactic Acid, Venous 3.25 (*)    All other components within normal limits  CG4 I-STAT (LACTIC ACID) - Abnormal; Notable for the following:    Lactic Acid, Venous 3.69 (*)    All  other components within normal limits  CULTURE, BLOOD (ROUTINE X 2)  CULTURE, BLOOD (ROUTINE X 2)  URINE CULTURE  MRSA PCR SCREENING  LIPASE, BLOOD  TROPONIN I  BASIC METABOLIC PANEL  CBC  PROTIME-INR  I-STAT TROPOININ, ED  I-STAT CG4 LACTIC ACID, ED   ____________________________________________  EKG   EKG Interpretation  Date/Time:  Sunday April 08 2016 07:56:51 EST Ventricular Rate:  151 PR Interval:    QRS Duration: 98 QT Interval:  311 QTC Calculation: 493 R Axis:   69 Text Interpretation:  Atrial fibrillation with rapid V-rate Ventricular premature complex  Repolarization abnormality, prob rate related No STEMI.  Confirmed by LONG MD, JOSHUA [phone]) on 04/08/2016 9:14:19 AM Also confirmed by LONG MD, JOSHUA [phone]), editor [place] CT, Leda Gauze [phone])  on 04/08/2016 11:08:09 AM       ____________________________________________  RADIOLOGY  Dg Chest 2 View  Result Date: 04/08/2016 CLINICAL DATA:  Productive cough and weakness. EXAM: CHEST  2 VIEW COMPARISON:  02/20/2015 FINDINGS: The cardiac silhouette is mildly enlarged. Mediastinal contours appear intact. Calcific atherosclerotic disease of the aorta. There is no evidence of pleural effusion or pneumothorax. Diffuse increase of the interstitial markings. Osseous structures are without acute abnormality. Soft tissues are grossly normal. IMPRESSION: Diffusely increased interstitial markings usually associated with interstitial pulmonary edema. Electronically Signed   By: Fidela Salisbury M.D.   On: 04/08/2016 07:45   Dg Foot 2 Views Left  Result Date: 04/08/2016 CLINICAL DATA:  Fall today with left foot pain, initial encounter EXAM: LEFT FOOT - 2 VIEW COMPARISON:  None. FINDINGS: No acute fracture or dislocation is noted. Mild tarsal degenerative changes are seen. The soft tissue abnormality is noted. IMPRESSION: No acute abnormality noted. Electronically Signed   By: Inez Catalina M.D.   On: 04/08/2016 18:47     ____________________________________________   PROCEDURES  Procedure(s) performed:   Procedures  CRITICAL CARE Performed by: Margette Fast Total critical care time: 45 minutes Critical care time was exclusive of separately billable procedures and treating other patients. Critical care was necessary to treat or prevent imminent or life-threatening deterioration. Critical care was time spent personally by me on the following activities: development of treatment plan with patient and/or surrogate as well as nursing, discussions with consultants, evaluation of patient's response to treatment, examination of patient, obtaining history from patient or surrogate, ordering and performing treatments and interventions, ordering and review of laboratory studies, ordering and review of radiographic studies, pulse oximetry and re-evaluation of patient's condition.  Nanda Quinton, MD Emergency Medicine  ____________________________________________   INITIAL IMPRESSION / ASSESSMENT AND PLAN / ED COURSE  Pertinent labs & imaging results that were available during my care of the patient were reviewed by me and considered in my medical decision making (see chart for details).  Patient resents to the emergency department for evaluation of URI symptoms, cough, hypoxemia, weakness, and left lower extremity pain. Patient has a history of neuropathy. Patient has clear lungs on exam. He appears generally weak. Question flu vs PNA. No evidence of fracture on the LLE. No clear indication for x-ray. No indication for CT.   08:50 PM Patient in A. fib RVR with elevated lactate. Continuing IV fluids and started broad-spectrum antibiotics. We'll give a very small amount of diltiazem. I plan to allow some tachycardias the patient to be compensating for underlying shock but will try and decrease his heart rate somewhat.   09:52 AM Patient heart rate is down trending slowly with tilt drip. His oxygen saturation  remains on the lower side. Given some of the edema on chest x-ray and need for additional IV fluids with his elevated lactate going to transition him to BiPAP. I've explained this to him and his wife and they are in agreement. Patient is awake and alert but continues to look generally unwell. Flu pending.   Discussed patient's case with hospitalist, Dr. Bonner Puna.  Recommend admission to SDU, inpatient bed.  I will place holding orders per their request. Patient and family (if present) updated with plan. Care transferred to hospitalist service.  I reviewed all nursing notes, vitals, pertinent  old records, EKGs, labs, imaging (as available).  ____________________________________________  FINAL CLINICAL IMPRESSION(S) / ED DIAGNOSES  Final diagnoses:  Hypoxia  Atrial fibrillation with RVR (HCC)  Fever, unspecified fever cause  Left foot pain     MEDICATIONS GIVEN DURING THIS VISIT:  Medications  acetaminophen (TYLENOL) tablet 650 mg (650 mg Oral Not Given 04/08/16 0854)  diltiazem (CARDIZEM) 1 mg/mL load via infusion 5 mg (5 mg Intravenous Bolus from Bag 04/08/16 0917)    And  diltiazem (CARDIZEM) 100 mg in dextrose 5% 159mL (1 mg/mL) infusion (5 mg/hr Intravenous Transfusing/Transfer 04/08/16 1136)  sodium chloride flush (NS) 0.9 % injection 3 mL (not administered)  cefTRIAXone (ROCEPHIN) 1 g in dextrose 5 % 50 mL IVPB (1 g Intravenous Given 04/08/16 1709)  oseltamivir (TAMIFLU) capsule 75 mg (75 mg Oral Given 04/08/16 1709)  Warfarin - Pharmacist Dosing Inpatient ( Does not apply Duplicate [id number])  morphine 2 MG/ML injection 2-4 mg (2 mg Intravenous Given 04/08/16 1804)  sodium chloride 0.9 % bolus 500 mL (0 mLs Intravenous Stopped 04/08/16 0936)  sodium chloride 0.9 % bolus 1,000 mL (0 mLs Intravenous Stopped 04/08/16 1013)    And  sodium chloride 0.9 % bolus 250 mL (0 mLs Intravenous Stopped 04/08/16 0936)  levofloxacin (LEVAQUIN) IVPB 750 mg (0 mg Intravenous Stopped 04/08/16 1055)    aztreonam (AZACTAM) 2 g in dextrose 5 % 50 mL IVPB (0 g Intravenous Stopped 04/08/16 1013)  vancomycin (VANCOCIN) IVPB 1000 mg/200 mL premix (0 mg Intravenous Stopped 04/08/16 1013)  acetaminophen (TYLENOL) suppository 650 mg (650 mg Rectal Given 04/08/16 0910)  sodium chloride 0.9 % bolus 500 mL (500 mLs Intravenous Transfusing/Transfer 04/08/16 1136)  warfarin (COUMADIN) tablet 2 mg (2 mg Oral Given 04/08/16 1806)     NEW OUTPATIENT MEDICATIONS STARTED DURING THIS VISIT:  None   Note:  This document was prepared using Dragon voice recognition software and may include unintentional dictation errors.  Nanda Quinton, MD Emergency Medicine   Margette Fast, MD 04/08/16 [phone]

## 2016-04-08 NOTE — ED Notes (Signed)
Pt  Reports that he has been feeling weak x 4 days has had a productive cough, Pt states that he became dizzy and fell to the floor hurting his left foot and right hand. Pt is unable to straighten out his left leg which is new for his baseline as per pt. Pt  Is A & O x 4. Pt O2 saturations remained in the 80's on RA placed pt on 4 LPM via [place] to maintain 95-99% of oxygen.

## 2016-04-08 NOTE — Progress Notes (Signed)
ANTICOAGULATION CONSULT NOTE - Initial Consult  Pharmacy Consult for warfarin Indication: atrial fibrillation  Allergies  Allergen Reactions  . Penicillins Other (See Comments)    Has patient had a PCN reaction causing immediate rash, facial/tongue/throat swelling, SOB or lightheadedness with hypotension: no Has patient had a PCN reaction causing severe rash involving mucus membranes or skin necrosis: no Has patient had a PCN reaction that required hospitalization no Has patient had a PCN reaction occurring within the last 10 years: no If all of the above answers are "NO", then may proceed with Cephalosporin use.     Patient Measurements:   Heparin Dosing Weight:   Vital Signs: Temp: 98.3 F (36.8 C) (01/28 1200) Temp Source: Axillary (01/28 1200) BP: 89/48 (01/28 1550) Pulse Rate: 111 (01/28 1550)  Labs:  Recent Labs  04/08/16 0805  HGB 14.0  HCT 42.9  PLT 169  LABPROT 26.0*  INR 2.33  CREATININE 0.84    Estimated Creatinine Clearance: 60.3 mL/min (by C-G formula based on SCr of 0.84 mg/dL).   Medical History: Past Medical History:  Diagnosis Date  . Allergic rhinitis   . Atrial fibrillation ([place])   . CIDP (chronic inflammatory demyelinating polyneuropathy) ([place])   . Colonic polyp   . H/O: CVA (cerebrovascular accident)   . Hyperbilirubinemia   . Hyperlipidemia   . Long term (current) use of anticoagulants   . Mitral regurgitation   . Nephrolithiasis   . Nevus, atypical   . Osteopenia   . Recurrent boils   . Unsteady gait     Assessment: 89 YOM presents with weakness and influenza.  History of afib on warfarin at home.  INR therapeutic at time of admission  Home dose: warfarin 2mg  daily (last dose 1/27)  Today, 04/08/2016  INR = 2.33  CBC: Hgb and pltc WNL  No major drug-drug interactions  Diet NPO  Goal of Therapy:  INR 2-3 Monitor platelets by anticoagulation protocol: Yes   Plan:   Warfarin 2mg  PO x 1  Watch INR trend with acute  illness and currently NPO, consider giving 1mg  1/29 if remains NPO  Daily INR  Doreene Eland, PharmD, BCPS.   Pager: [phone] 04/08/2016 4:48 PM

## 2016-04-08 NOTE — Progress Notes (Signed)
RT assisted with transferring PT from [facility] ED to [facility] ICU on BiPAP- uneventful. RN at bedside.

## 2016-04-08 NOTE — ED Notes (Signed)
Bed: WA07 Expected date:  Expected time:  Means of arrival:  Comments: 

## 2016-04-08 NOTE — ED Notes (Signed)
Pt got out of bed to go to the bathroom and felt weak and helped himself to the ground, his wife and NT helped him up,  Pt states that he went to bed feeling fine

## 2016-04-08 NOTE — Progress Notes (Signed)
PHARMACY - LOVENOX X 1 DOSE  Patient known to pharmacy for warfarin dosing for AFib.  Received request to dose Lovenox x 1 dose for ACS/NSTEMI/CP with elevated troponins.  INR this AM = 2.33  Spoke with Dr Ara Kussmaul to confirm that Lovenox is intended with a therapeutic INR.  Due to concerns for demand ischemia, Lovenox is wanted for a one-time dose.  Plan: Lovenox 70mg  sq x 1 dose (1 mg/kg)  Leone Haven, PharmD

## 2016-04-08 NOTE — ED Notes (Signed)
Blood cultures drawn at 8:21 and 9:04

## 2016-04-08 NOTE — ED Notes (Signed)
Per dr. Margretta Sidle cardizem down to 5mg /hr

## 2016-04-08 NOTE — ED Notes (Signed)
Pt assisted with the urinal but was unable to urinate at this time.

## 2016-04-08 NOTE — Progress Notes (Signed)
Patient ID: Rodney Malone, male   DOB: [birth year], 83 y.o.   MRN: [id number]   Called by nursing regarding elevated troponins.  0.06-->1.57-->2.15.  Patient admitted with afib with RVR, acute hypoxic resp failure, sepsis.   Troponins may be demand ischemia given recent events, however will cover with one dose of weight based Lovenox and continue to trend troponins and monitor closely. CCM following.  Please consider cardiology consult in AM.

## 2016-04-08 NOTE — Progress Notes (Signed)
RT removed PT from BiPAP and placed on 4lpm [place]. Son and daughter in law at bedside wishing to be able to communicate with PT. PT appears awake and not in respiratory distress at this time. RN aware.  Current Vitals- HR 107, RR 22, Sp02 95%- RN aware

## 2016-04-08 NOTE — Progress Notes (Signed)
Pharmacy Antibiotic Note  Rodney Malone is a 83 y.o. male admitted on 04/08/2016 with sepsis.  Pharmacy has been consulted for vancomycin, aztreonam and levaquin dosing (PCN allergy).  Plan: Vancomycin 1g x 1, then 750mg  q12h Check trough at steady state, goal 15-20 mcg/ml Levofloxacin 750mg  IV q24h Aztreonam 2g IV x 1, then 1g IVq8h Follow up renal function & cultures    Temp (24hrs), Avg:100.5 F (38.1 C), Min:98.6 F (37 C), Max:103.3 F (39.6 C)   Recent Labs Lab 04/08/16 0805 04/08/16 0830  CREATININE 0.84  --   LATICACIDVEN  --  3.25*    Estimated Creatinine Clearance: 60.3 mL/min (by C-G formula based on SCr of 0.84 mg/dL).    Allergies  Allergen Reactions  . Penicillins Other (See Comments)    Has patient had a PCN reaction causing immediate rash, facial/tongue/throat swelling, SOB or lightheadedness with hypotension: no Has patient had a PCN reaction causing severe rash involving mucus membranes or skin necrosis: no Has patient had a PCN reaction that required hospitalization no Has patient had a PCN reaction occurring within the last 10 years: no If all of the above answers are "NO", then may proceed with Cephalosporin use.     Antimicrobials this admission:  1/28 Vanc >> 1/28 Levo >> 1/28 Aztreo >>  Dose adjustments this admission:  ---  Microbiology results:  1/28 BCx: sent 1/28 UCx: sent  1/28 Influenza panel:   Thank you for allowing pharmacy to be a part of this patient's care.  Peggyann Juba, PharmD, BCPS Pager: [phone] 04/08/2016 9:22 AM

## 2016-04-08 NOTE — Progress Notes (Addendum)
PULMONARY / CRITICAL CARE MEDICINE  04/08/2016 Name: Rodney Malone MRN: [id number] DOB: November 05, 1922    ADMISSION DATE:  04/08/2016 CONSULTATION DATE:  04/08/2016  REFERRING MD:  Dr. Bonner Puna  CHIEF COMPLAINT:  Shortness of breath, fever  HISTORY OF PRESENT ILLNESS:   83 year old male with a past medical history significant for A. Fib came to the [facility] emergency department on 04/08/2016 after a fall at home. He had noted URI symptoms for several weeks. In the emergency department he was noted to be febrile, tachycardic, and hypotensive. Prior to admission he had fallen onto the floor. Upon my evaluation the patient was sleepy and was able to answer in 1 word questions but was not able to participate in history taking. History was taken from chart review, talking to other physicians, and the patient's daughter. He has been transferred to the intensive care unit because of a need for noninvasive mechanical ventilation and borderline blood pressure. In the emergency department he was noted to be tachycardicand was given a diltiazem drip which was associated with  Hypotension.  PAST MEDICAL HISTORY :  He  has a past medical history of Allergic rhinitis; Atrial fibrillation ([place]); CIDP (chronic inflammatory demyelinating polyneuropathy) ([place]); Colonic polyp; H/O: CVA (cerebrovascular accident); Hyperbilirubinemia; Hyperlipidemia; Long term (current) use of anticoagulants; Mitral regurgitation; Nephrolithiasis; Nevus, atypical; Osteopenia; Recurrent boils; and Unsteady gait.  PAST SURGICAL HISTORY: He  has a past surgical history that includes Appendectomy.  Allergies  Allergen Reactions  . Penicillins Other (See Comments)    Has patient had a PCN reaction causing immediate rash, facial/tongue/throat swelling, SOB or lightheadedness with hypotension: no Has patient had a PCN reaction causing severe rash involving mucus membranes or skin necrosis: no Has patient had a PCN reaction that required  hospitalization no Has patient had a PCN reaction occurring within the last 10 years: no If all of the above answers are "NO", then may proceed with Cephalosporin use.     No current facility-administered medications on file prior to encounter.    Current Outpatient Prescriptions on File Prior to Encounter  Medication Sig  . cetirizine (ZYRTEC) 10 MG tablet Take 10 mg by mouth as needed.   . warfarin (COUMADIN) 2 MG tablet Take 2 mg by mouth daily.     FAMILY HISTORY:  His indicated that his mother is deceased. He indicated that his father is deceased. He indicated that his sister is deceased. He indicated that his brother is deceased.    SOCIAL HISTORY: He  reports that he quit smoking about 42 years ago. He has never used smokeless tobacco. He reports that he drinks alcohol. He reports that he does not use drugs.  REVIEW OF SYSTEMS:   Cannot obtain due to confusion, sleepiness  SUBJECTIVE:  As above  VITAL SIGNS: BP (!) 83/59   Pulse (!) 130   Temp 98.3 F (36.8 C) (Axillary)   Resp (!) 36   SpO2 95%   HEMODYNAMICS:    VENTILATOR SETTINGS:    INTAKE / OUTPUT: No intake/output data recorded.  PHYSICAL EXAMINATION: General:  Chronically ill-appearing elderly male resting comfortably on BiPAP Neuro:  Sleepy but arousable to touch, moves all 4 extremities, inattentive to conversation HEENT:  NCAT, BIPAP mask in place Cardiovascular:  Irreg irreg, systolic murmur noted, JVD noted, warm, radial pulses intact Lungs:  CTA B, normal effort Abdomen:  BS+, soft, nontender Musculoskeletal:  MSK normal bulk and tone Skin:  Dry, no breakdown  LABS:  BMET  Recent Labs Lab 04/08/16 0805  NA 139  K 3.8  CL 105  CO2 24  BUN 19  CREATININE 0.84  GLUCOSE 145*    Electrolytes  Recent Labs Lab 04/08/16 0805  CALCIUM 9.3    CBC  Recent Labs Lab 04/08/16 0805  WBC 14.6*  HGB 14.0  HCT 42.9  PLT 169    Coag's  Recent Labs Lab 04/08/16 0805  INR  2.33    Sepsis Markers  Recent Labs Lab 04/08/16 0830 04/08/16 1142  LATICACIDVEN 3.25* 3.69*    ABG  Recent Labs Lab 04/08/16 1258  PHART 7.396  PCO2ART 29.8*  PO2ART 343*    Liver Enzymes  Recent Labs Lab 04/08/16 0805  AST 47*  ALT 15*  ALKPHOS 59  BILITOT 2.0*  ALBUMIN 4.5    Cardiac Enzymes No results for input(s): TROPONINI, PROBNP in the last 168 hours.  Glucose No results for input(s): GLUCAP in the last 168 hours.  Imaging Dg Chest 2 View  Result Date: 04/08/2016 CLINICAL DATA:  Productive cough and weakness. EXAM: CHEST  2 VIEW COMPARISON:  02/20/2015 FINDINGS: The cardiac silhouette is mildly enlarged. Mediastinal contours appear intact. Calcific atherosclerotic disease of the aorta. There is no evidence of pleural effusion or pneumothorax. Diffuse increase of the interstitial markings. Osseous structures are without acute abnormality. Soft tissues are grossly normal. IMPRESSION: Diffusely increased interstitial markings usually associated with interstitial pulmonary edema. Electronically Signed   By: Fidela Salisbury M.D.   On: 04/08/2016 07:45   1/28 CXR images reviewed showing interstitial pulmonary edema  ANTIBIOTICS: Vanc 1/28 > Levaquin 1/28 >  Aztreonam 1/28 >   CULTURES: Flu swab 1/28 negative Blood culture 1/28 >  Urine culture 1/28 >   LINES/TUBES: 12/28 BIPAP >   DISCUSSION: 83 year old male with a past medical history significant for severe mitral regurgitation and atrial fibrillation presented to the [facility] emergency department on 04/08/2016 with a chief complaint of fall, noted to be febrile.  PCCM was consulted for hypotension in the setting of diltiazem administration and acute respiratory failure with hypoxemia.  He appears to have hypoxemia due to acute pulmonary edema due to volume resuscitation in the setting of Afib with RVR and severe mitral regurgitation.  Fortunately by the time of his arrival to the ICU on 1/28  he is resting comfortably on BIPAP.  ASSESSMENT / PLAN:  PULMONARY A: Acute respiratory failure with hypoxemia Acute pulmonary edema P:   Continue BIPAP  Monitor closely in ICU   CARDIOVASCULAR A:  Afib with RVR Hypotension> initially due to septic shock, now appears volume overloaded, complicated by diltiazem Severe mitral regurgitation P:  Tele If hypotensive again, would use low dose levophed If Afib with RVR again, do not use diltiazem, would use IV amiodarone   RENAL A:   No acute issues P:   Monitor BMET and UOP Replace electrolytes as needed   GASTROINTESTINAL A:   No acute issues P:   Keep npo  HEMATOLOGIC A:   No acute issues P:  Monitor for bleeding  INFECTIOUS A:   UTI Severe sepsis P:   F/u cultures Continue   ENDOCRINE A:   Hyperglycemia P:   Per TRH  NEUROLOGIC A:   CIDP Fall risk P:   PT consult when mobilized    FAMILY  - Updates: I met with his daughter [name] this afternoon and discussed his situation.  I explained that he has multiple comorbid illnesses and advanced age with a severe acute illness requiring ICU level care.  I  explained that there is a high risk of death from this condition and discussed life supportive interventions (CPR, Mechanical ventilation).  I also explained that with his advanced age and comorbid illnesses the administration of these interventions would likely cause more harm than good.  Based on this she believes that we should change his code status to DNR, but she wants to discuss this with her mother and brother.  She will let us know the outcome of that situation.  - Inter-disciplinary family meet or Palliative Care meeting due by: day 7  My ccm time 45 minutes  Sepsis - Repeat Assessment  Performed at:    [place]     Blood pressure (!) 83/59, pulse (!) 130, temperature 98.3 F (36.8 C), temperature source Axillary, resp. rate (!) 36, SpO2 95 %.  Heart:     Irreg rhythm, normal  rate  Lungs:    CTA  Capillary Refill:   <2 sec  Peripheral Pulse:   Radial pulse palpable  Skin:     Normal Color     Roselie Awkward, MD [facility] Pager: [phone] Cell: [phone] After 3pm or if no response, call [phone]  04/08/2016, 1:41 PM

## 2016-04-08 NOTE — H&P (Signed)
History and Physical   Rodney Malone [id number] DOB: 1922/07/20 DOA: 04/08/2016  Referring MD/NP/PA: Dr. Laverta [place], [place] PCP: [name], MD Outpatient Specialists: Dr. [place], Cardiology  Patient coming from: Home  Chief Complaint: Weakness  HPI: Rodney Malone is a 83 y.o. male with a history of AFib on coumadin and CIDP who presented for weakness, having slipped out of bed this morning.   His wife reports that he's had a cough for many months that became productive of sputum over the past 24 hours without chest pain, but has noticed some dyspnea. During this time she's also noticed gradual onset of worsening, constant weakness. He usually uses a walker at home and has been requiring increased assistance for ambulation and a wheelchair at times. Overnight he got up to the bathroom with significant assistance from his wife and a walker, but safely made it back to bed. Later she saw him trying to get up, sitting on the side of the bed holding on to the railing when he slid down the side. No LOC, head trauma or ensuing pain but they were unable to get him up and called the fire department for assistance. He was found hypoxic in the 80%'s and placed on oxygen.   ED Course: On arrival he was tachypneic and found to be in AFib with RVR, HR up to 169. Rectal temperature 103.36F. WBC 14.6, lactate 3.25, troponin 0.06. CXR showed pulmonary edema and UA is pending. He was started on BiPAP and [facility] asked to admit.   Review of Systems: Per HPI. All others reviewed and are negative.   Past Medical History:  Diagnosis Date  . Allergic rhinitis   . Atrial fibrillation ([place])   . CIDP (chronic inflammatory demyelinating polyneuropathy) ([place])   . Colonic polyp   . H/O: CVA (cerebrovascular accident)   . Hyperbilirubinemia   . Hyperlipidemia   . Long term (current) use of anticoagulants   . Mitral regurgitation   . Nephrolithiasis   . Nevus, atypical   . Osteopenia   . Recurrent boils   . Unsteady  gait     Past Surgical History:  Procedure Laterality Date  . APPENDECTOMY       reports that he quit smoking about 42 years ago. He has never used smokeless tobacco. He reports that he drinks alcohol. He reports that he does not use drugs.  Allergies  Allergen Reactions  . Penicillins Other (See Comments)    Has patient had a PCN reaction causing immediate rash, facial/tongue/throat swelling, SOB or lightheadedness with hypotension: no Has patient had a PCN reaction causing severe rash involving mucus membranes or skin necrosis: no Has patient had a PCN reaction that required hospitalization no Has patient had a PCN reaction occurring within the last 10 years: no If all of the above answers are "NO", then may proceed with Cephalosporin use.     History reviewed. No pertinent family history. - Family history otherwise reviewed and not pertinent.  Prior to Admission medications   Medication Sig Start Date End Date Taking? Authorizing Provider  cetirizine (ZYRTEC) 10 MG tablet Take 10 mg by mouth as needed.    Yes Historical Provider, MD  warfarin (COUMADIN) 2 MG tablet Take 2 mg by mouth daily.    Yes Historical Provider, MD    Physical Exam: Vitals:   04/08/16 1000 04/08/16 1044 04/08/16 1049 04/08/16 1100  BP: (!) 170/110  119/77 (!) 97/52  Pulse: 90 (!) 126 119 111  Resp: 23 (!) 39 (!) 29 (!)  36  Temp:      TempSrc:      SpO2: (!) 83% 94% 97% 92%   Constitutional: 83 y.o. male in no distress, anxious demeanor Eyes: Lids and conjunctivae normal, PERRL ENMT: Mucous membranes are moist. Posterior pharynx clear of any exudate or lesions. Fair dentition.  Neck: normal, supple, no masses, no thyromegaly Respiratory: Labored breathing with accessory muscle use, tachypneic with O2 saturations in mid 80%'s on 5L . Diffusely coarse breath sounds bilaterally. Cardiovascular: Tachycardic and irregular. Difficult to appreciate murmurs, rubs, or gallops with significant pulmonary  findings. No carotid bruits. No LE edema. 2+ pedal pulses. Abdomen: Normoactive bowel sounds. No tenderness, non-distended, and no masses palpated. No hepatosplenomegaly. GU: No indwelling catheter Musculoskeletal: No clubbing / cyanosis. No joint deformity upper and lower extremities. Good ROM, no contractures. Normal muscle tone.  Skin: Warm, dry. No rashes, wounds, ulcers. No significant lesions noted.  Neurologic: CN II-XII grossly intact. No aphasia. Tremulous diffusely with no focal deficits in motor strength or sensation in all extremities.  Psychiatric: Alert and oriented x3.  Labs on Admission: I have personally reviewed following labs and imaging studies  CBC:  Recent Labs Lab 04/08/16 0805  WBC 14.6*  NEUTROABS 10.5*  HGB 14.0  HCT 42.9  MCV 94.7  PLT 123XX123   Basic Metabolic Panel:  Recent Labs Lab 04/08/16 0805  NA 139  K 3.8  CL 105  CO2 24  GLUCOSE 145*  BUN 19  CREATININE 0.84  CALCIUM 9.3   GFR: Estimated Creatinine Clearance: 60.3 mL/min (by C-G formula based on SCr of 0.84 mg/dL). Liver Function Tests:  Recent Labs Lab 04/08/16 0805  AST 47*  ALT 15*  ALKPHOS 59  BILITOT 2.0*  PROT 7.3  ALBUMIN 4.5    Recent Labs Lab 04/08/16 0805  LIPASE 26   Coagulation Profile:  Recent Labs Lab 04/08/16 0805  INR 2.33   Urine analysis:    Component Value Date/Time   COLORURINE YELLOW 02/20/2015 0120   APPEARANCEUR CLOUDY (A) 02/20/2015 0120   LABSPEC 1.015 02/20/2015 0120   PHURINE 5.0 02/20/2015 0120   GLUCOSEU NEGATIVE 02/20/2015 0120   HGBUR TRACE (A) 02/20/2015 0120   BILIRUBINUR NEGATIVE 02/20/2015 0120   KETONESUR NEGATIVE 02/20/2015 0120   PROTEINUR NEGATIVE 02/20/2015 0120   UROBILINOGEN 0.2 08/09/2008 1949   NITRITE NEGATIVE 02/20/2015 0120   LEUKOCYTESUR TRACE (A) 02/20/2015 0120   Radiological Exams on Admission: Dg Chest 2 View  Result Date: 04/08/2016 CLINICAL DATA:  Productive cough and weakness. EXAM: CHEST  2 VIEW  COMPARISON:  02/20/2015 FINDINGS: The cardiac silhouette is mildly enlarged. Mediastinal contours appear intact. Calcific atherosclerotic disease of the aorta. There is no evidence of pleural effusion or pneumothorax. Diffuse increase of the interstitial markings. Osseous structures are without acute abnormality. Soft tissues are grossly normal. IMPRESSION: Diffusely increased interstitial markings usually associated with interstitial pulmonary edema. Electronically Signed   By: Fidela Salisbury M.D.   On: 04/08/2016 07:45    EKG: Independently reviewed. AFib with ventricular rate >150.   Assessment/Plan Active Problems:   Mitral regurgitation due to cusp prolapse   Hyperlipidemia   CIDP (chronic inflammatory demyelinating polyneuropathy) ([place])   Long term current use of anticoagulant therapy   Atrial fibrillation with RVR (HCC)   Atrial fibrillation with rapid ventricular response:  - Dilt gtt started, titrate to effect HR < 120. Worry about decreasing output but currently hypertensive. Last EF preserved in 2015.  - INR therapeutic, continue coumadin - Cycle cardiac enzymes,  initial troponin 0.06 - Echocardiogram  Acute hypoxemic respiratory failure: Diffuse interstitial edema on CXR without infiltrate.  - Suspect flu, will start tamiflu and stop if swab is negative. - Continue empiric abx, PCN allergy noted. - Given ~2L fluids for sepsis and has suggested pulmonary edema and mild cardiomegaly on CXR. Will hold fluids and monitor for fear that further fluids will worsen hypoxemia. - Escalating to BiPAP. Will make CCM aware of pt, confirmed full code. - Check ABG  Sepsis: Due to suspected pulmonary source, treating empirically for bacterial pneumonia, testing for flu. UA pending.  - Follow blood cultures, urinalysis and urine culture - Abx and tamiflu as above  DVT prophylaxis: On coumadin  Code Status: Full  Family Communication: Daughter and wife at bedside Disposition Plan:  Uncertain Consults called: CCM, Dr. [place] Bells made aware. Will see the patient. Admission status: Inpatient, SDU    Vance Gather, MD [facility] Pager [phone]  If 7PM-7AM, please contact night-coverage [email or web address] Password [facility] 04/08/2016, 11:32 AM

## 2016-04-09 ENCOUNTER — Inpatient Hospital Stay (HOSPITAL_COMMUNITY): Payer: Medicare Other

## 2016-04-09 DIAGNOSIS — E785 Hyperlipidemia, unspecified: Secondary | ICD-10-CM

## 2016-04-09 DIAGNOSIS — G6181 Chronic inflammatory demyelinating polyneuritis: Secondary | ICD-10-CM

## 2016-04-09 DIAGNOSIS — J1100 Influenza due to unidentified influenza virus with unspecified type of pneumonia: Secondary | ICD-10-CM

## 2016-04-09 DIAGNOSIS — I4891 Unspecified atrial fibrillation: Secondary | ICD-10-CM

## 2016-04-09 LAB — LACTIC ACID, PLASMA: Lactic Acid, Venous: 1.3 mmol/L (ref 0.5–1.9)

## 2016-04-09 LAB — CBC
HCT: 39.7 % (ref 39.0–52.0)
Hemoglobin: 12.6 g/dL — ABNORMAL LOW (ref 13.0–17.0)
MCH: 30.3 pg (ref 26.0–34.0)
MCHC: 31.7 g/dL (ref 30.0–36.0)
MCV: 95.4 fL (ref 78.0–100.0)
PLATELETS: 148 10*3/uL — AB (ref 150–400)
RBC: 4.16 MIL/uL — ABNORMAL LOW (ref 4.22–5.81)
RDW: 15.1 % (ref 11.5–15.5)
WBC: 24.3 10*3/uL — ABNORMAL HIGH (ref 4.0–10.5)

## 2016-04-09 LAB — BASIC METABOLIC PANEL
ANION GAP: 7 (ref 5–15)
BUN: 21 mg/dL — AB (ref 6–20)
CO2: 24 mmol/L (ref 22–32)
Calcium: 8.6 mg/dL — ABNORMAL LOW (ref 8.9–10.3)
Chloride: 110 mmol/L (ref 101–111)
Creatinine, Ser: 0.9 mg/dL (ref 0.61–1.24)
GFR calc Af Amer: >60 mL/min (ref >60)
GLUCOSE: 97 mg/dL (ref 65–99)
Potassium: 3.5 mmol/L (ref 3.5–5.1)
SODIUM: 141 mmol/L (ref 135–145)

## 2016-04-09 LAB — PROTIME-INR
INR: 3.68
PROTHROMBIN TIME: 37.4 s — AB (ref 11.4–15.2)

## 2016-04-09 LAB — ECHOCARDIOGRAM COMPLETE
Height: 72 in
Weight: 2490.32 oz

## 2016-04-09 LAB — URINE CULTURE

## 2016-04-09 LAB — TROPONIN I
Troponin I: 1.64 ng/mL (ref <0.03)
Troponin I: 1.57 ng/mL (ref <0.03)
Troponin I: 1.37 ng/mL (ref <0.03)

## 2016-04-09 MED ORDER — OSELTAMIVIR PHOSPHATE 30 MG PO CAPS
30.0000 mg | ORAL_CAPSULE | Freq: Two times a day (BID) | ORAL | Status: DC
  Administered 2016-04-09 – 2016-04-12 (×6): 30 mg via ORAL
  Filled 2016-04-09 (×7): qty 1

## 2016-04-09 MED ORDER — BISACODYL 5 MG PO TBEC: 5.0000 mg | DELAYED_RELEASE_TABLET | Freq: Every day | ORAL | Status: DC | PRN

## 2016-04-09 NOTE — Progress Notes (Signed)
PULMONARY / CRITICAL CARE MEDICINE  04/08/2016 Name: Rodney Malone MRN: [id number] DOB: 24-May-1922    ADMISSION DATE:  04/08/2016 CONSULTATION DATE:  04/08/2016  REFERRING MD:  Dr. Bonner Puna  CHIEF COMPLAINT:  Shortness of breath, fever  HISTORY OF PRESENT ILLNESS:   83 year old male with a past medical history significant for A. Fib came to the [facility] emergency department on 04/08/2016 after a fall at home. He had noted URI symptoms for several weeks. In the emergency department he was noted to be febrile, tachycardic, and hypotensive. Prior to admission he had fallen onto the floor. Upon my evaluation the patient was sleepy and was able to answer in 1 word questions but was not able to participate in history taking. History was taken from chart review, talking to other physicians, and the patient's daughter. He has been transferred to the intensive care unit because of a need for noninvasive mechanical ventilation and borderline blood pressure. In the emergency department he was noted to be tachycardicand was given a diltiazem drip which was associated with  Hypotension.  SUBJECTIVE:  RN reports tea colored urine, pt with rusty colored sputum production.  Afebrile, VSS.  Remains in AF (chronic, on coumadin) rate controlled.  Wore bipap briefly yesterday > now on 2L   VITAL SIGNS: BP (!) 97/44   Pulse (!) 112   Temp 97.3 F (36.3 C) (Oral)   Resp (!) 22   Ht 6' (1.829 m)   Wt 155 lb 10.3 oz (70.6 kg)   SpO2 95%   BMI 21.11 kg/m   HEMODYNAMICS:    VENTILATOR SETTINGS: FiO2 (%):  [100 %] 100 %  INTAKE / OUTPUT: I/O last 3 completed shifts: In: 1000 [I.V.:330; Other:620; IV Piggyback:50] Out: 85 [Urine:85]  PHYSICAL EXAMINATION: General:  Elderly male in NAD, sitting up in bed HEENT: MM pink/moist, good dentition  PSY: normal mood / affect Neuro: AAOx4 CV: s1s2 rrr, SEM PULM: even/non-labored, lungs bilaterally clear [id number], non-tender, bsx4 active  Extremities:  warm/dry, no edema, chronic changes of LE's c/w venous insufficiency Skin: no rashes or lesions   LABS:  BMET  Recent Labs Lab 04/08/16 0805 04/09/16 0209  NA 139 141  K 3.8 3.5  CL 105 110  CO2 24 24  BUN 19 21*  CREATININE 0.84 0.90  GLUCOSE 145* 97    Electrolytes  Recent Labs Lab 04/08/16 0805 04/09/16 0209  CALCIUM 9.3 8.6*    CBC  Recent Labs Lab 04/08/16 0805 04/09/16 0209  WBC 14.6* 24.3*  HGB 14.0 12.6*  HCT 42.9 39.7  PLT 169 148*    Coag's  Recent Labs Lab 04/08/16 0805 04/09/16 0209  INR 2.33 3.68    Sepsis Markers  Recent Labs Lab 04/08/16 0830 04/08/16 1142 04/09/16 0910  LATICACIDVEN 3.25* 3.69* 1.3    ABG  Recent Labs Lab 04/08/16 1258  PHART 7.396  PCO2ART 29.8*  PO2ART 343*    Liver Enzymes  Recent Labs Lab 04/08/16 0805  AST 47*  ALT 15*  ALKPHOS 59  BILITOT 2.0*  ALBUMIN 4.5    Cardiac Enzymes  Recent Labs Lab 04/08/16 1940 04/09/16 0209 04/09/16 0733  TROPONINI 2.15* 1.64* 1.57*    Glucose No results for input(s): GLUCAP in the last 168 hours.  Imaging Dg Foot 2 Views Left  Result Date: 04/08/2016 CLINICAL DATA:  Fall today with left foot pain, initial encounter EXAM: LEFT FOOT - 2 VIEW COMPARISON:  None. FINDINGS: No acute fracture or dislocation is noted. Mild tarsal degenerative changes are  seen. The soft tissue abnormality is noted. IMPRESSION: No acute abnormality noted. Electronically Signed   By: Inez Catalina M.D.   On: 04/08/2016 18:47   1/28 CXR images reviewed showing interstitial pulmonary edema  ANTIBIOTICS: Vanc 1/28 > 1/28 Levaquin 1/28 > 1/28  Aztreonam 1/28 > 1/28  Rocephin 1/28 >>   CULTURES: Flu swab 1/28 negative Blood culture 1/28 >  Urine culture 1/28 > multiple species, suggest recollect  UC 1/28 >>   LINES/TUBES: 12/28 BIPAP >   DISCUSSION: 83 year old male with a past medical history significant for severe mitral regurgitation and atrial fibrillation  presented to the [facility] emergency department on 04/08/2016 with a chief complaint of fall, noted to be febrile.  PCCM was consulted for hypotension in the setting of diltiazem administration and acute respiratory failure with hypoxemia. Hypoxemia due to acute pulmonary edema due to volume resuscitation in the setting of Afib with RVR and severe mitral regurgitation.  Concern for UTI on rocephin.  Briefly required bipap but is now on 2L.     ASSESSMENT / PLAN:  PULMONARY A: Acute respiratory failure with hypoxemia Acute pulmonary edema Influenza A positive P:   Wean O2 off for sats >90% Pulmonary hygiene - IS Intermittent CXR  D/c BiPAP  Continue Tamiflu, D2/5  CARDIOVASCULAR A:  Afib with RVR Hypotension - initially due to septic shock, now appears volume overloaded, complicated by diltiazem Severe mitral regurgitation Demand ischemia  P:  Tele / SDU monitoring  SL IVF Monitor troponin trend   RENAL A:   At Risk AKI - in setting of flu/hypotension P:   Monitor BMP / UOP  Replace electrolytes as indicated  Ensure adequate renal perfusion  GASTROINTESTINAL A:   Constipation  P:   Begin clear liquid diet and advance as tolerated PRN dulcolax   HEMATOLOGIC A:   Mild Anemia  Chronic Anticoagulation - warfarin therapy for AF P:  Trend CBC Monitor for bleeding > mild hematuria / rusty sputum  INFECTIOUS A:   UTI Influenza A Positive Severe sepsis secondary to above P:   ABX as above, D2/x Continue Tamiflu  Supportive therapies  Follow cultures  ENDOCRINE A:   Hyperglycemia - resolved P:   Per primary  NEUROLOGIC A:   CIDP Fall risk P:   PT when able     FAMILY  - Updates:  Daughter updated at bedside 1/29.  Family discussing code status.    - Inter-disciplinary family meet or Palliative Care meeting due by: day 7  PCCM will sign off.  Please call back if new needs arise.    Noe Gens, NP-C [facility] Pgr:  [phone] or if no answer [phone] 04/09/2016, 11:20 AM

## 2016-04-09 NOTE — Progress Notes (Signed)
Pt heart rate gradually increasing throughout shift.  At 2300, HR afib sustaining in the 120s and frequently increasing to 140s.  MD paged and orders for 150mg  IV amiodarone received and administered at 2330.  HR now afib 98, BP 99/65. Will continue to monitor.

## 2016-04-09 NOTE — Progress Notes (Signed)
  Echocardiogram 2D Echocardiogram has been performed.  Rodney Malone 04/09/2016, 12:29 PM

## 2016-04-09 NOTE — Progress Notes (Signed)
PHARMACY NOTE:  ANTIMICROBIAL RENAL DOSAGE ADJUSTMENT  Current antimicrobial regimen includes a mismatch between antimicrobial dosage and estimated renal function.  As per policy approved by the Pharmacy & Therapeutics and Medical Executive Committees, the antimicrobial dosage will be adjusted accordingly.  Current antimicrobial dosage:  Tamiflu 75 mg bid  Indication: Influenza  Renal Function:  Estimated Creatinine Clearance: 53.4 mL/min (by C-G formula based on SCr of 0.9 mg/dL). []      On intermittent HD, scheduled: []      On CRRT    Antimicrobial dosage has been changed to:  30 mg bid  Additional comments:   Thank you for allowing pharmacy to be a part of this patient's care.  Fabian Coca A, [place] 04/09/2016 3:20 PM

## 2016-04-09 NOTE — Progress Notes (Signed)
[place] for warfarin Indication: atrial fibrillation  Allergies  Allergen Reactions  . Penicillins Other (See Comments)    Has patient had a PCN reaction causing immediate rash, facial/tongue/throat swelling, SOB or lightheadedness with hypotension: no Has patient had a PCN reaction causing severe rash involving mucus membranes or skin necrosis: no Has patient had a PCN reaction that required hospitalization no Has patient had a PCN reaction occurring within the last 10 years: no If all of the above answers are "NO", then may proceed with Cephalosporin use.     Patient Measurements: Height: 6' (182.9 cm) Weight: 155 lb 10.3 oz (70.6 kg) IBW/kg (Calculated) : 77.6  Vital Signs: Temp: 98.2 F (36.8 C) (01/29 1239) Temp Source: Oral (01/29 1239) BP: 111/30 (01/29 1200) Pulse Rate: 116 (01/29 1239)  Labs:  Recent Labs  04/08/16 0805  04/08/16 1940 04/09/16 0209 04/09/16 0733  HGB 14.0  --   --  12.6*  --   HCT 42.9  --   --  39.7  --   PLT 169  --   --  148*  --   LABPROT 26.0*  --   --  37.4*  --   INR 2.33  --   --  3.68  --   CREATININE 0.84  --   --  0.90  --   TROPONINI  --   < > 2.15* 1.64* 1.57*  < > = values in this interval not displayed.  Estimated Creatinine Clearance: 53.4 mL/min (by C-G formula based on SCr of 0.9 mg/dL).  Assessment: 35 YOM presents with weakness and influenza.  History of afib on warfarin at home.  INR therapeutic at time of admission  Home dose: warfarin 2mg  daily (last dose 1/27)  Significant events: 1/29: Troponins elevated overnight; given Lovenox 1 mg/kg x 1 with concern for ACS/STEMI, but now suspect demand ischemia only. Lovenox not continued.  Today, 04/09/2016  INR now elevated; unlikely a single dose of Lovenox would affect INR so quickly or significantly  CBC: Hgb and Plt both slightly low  No major drug-drug interactions  Diet NPO  Goal of Therapy:  INR 2-3 Monitor  platelets by anticoagulation protocol: Yes   Plan:   Hold warfarin tonight  Daily INR; CBC at least q72 hrs while on warfarin inpatient  Monitor for signs of bleeding or thrombosis  Reuel Boom, PharmD, BCPS Pager: [phone] 04/09/2016, 2:30 PM

## 2016-04-09 NOTE — Progress Notes (Signed)
PROGRESS NOTE    Rodney Malone  [id number] DOB: [birth year] DOA: 04/08/2016 PCP: [name], MD    Brief Narrative:  Rodney Malone is a 83 y.o. male with a history of AFib on coumadin and CIDP who presented for weakness, having slipped out of bed this morning.   His wife reports that he's had a cough for many months that became productive of sputum over the past 24 hours without chest pain, but has noticed some dyspnea. During this time she's also noticed gradual onset of worsening, constant weakness. He usually uses a walker at home and has been requiring increased assistance for ambulation and a wheelchair at times. Overnight he got up to the bathroom with significant assistance from his wife and a walker, but safely made it back to bed. Later she saw him trying to get up, sitting on the side of the bed holding on to the railing when he slid down the side. No LOC, head trauma or ensuing pain but they were unable to get him up and called the fire department for assistance. He was found hypoxic in the 80%'s and placed on oxygen.   ED Course: On arrival he was tachypneic and found to be in AFib with RVR, HR up to 169. Rectal temperature 103.32F. WBC 14.6, lactate 3.25, troponin 0.06. CXR showed pulmonary edema and UA is pending. He was started on BiPAP and TR[facility] asked to admit.   PCCM was consulted and patient need amiodarone bolus x 1 overnight to control heart rate.  Troponins elevated but likely from demand ischemia.    Assessment & Plan:   Active Problems:   Mitral regurgitation due to cusp prolapse   Hyperlipidemia   CIDP (chronic inflammatory demyelinating polyneuropathy) (HCC)   Long term current use of anticoagulant therapy   Atrial fibrillation with RVR (HCC)   Fever   Acute cystitis without hematuria   Sepsis (HCC)   Atrial fibrillation with rapid ventricular response:  - Dilt gtt started, titrate to effect HR < 120. Worry about decreasing output but currently  hypertensive. Last EF preserved in 2015 (diltiazem later stopped 2/2 hypotension) - INR therapeutic, continue coumadin - cardiac enzymes peaked at 2.15 and downtrending - Echocardiogram pending - trend troponins- currently downtrending (elevation likely from demand ischemia 2/2 hypotension, sepsis and A.fib with RVR)  Acute hypoxemic respiratory failure: Diffuse interstitial edema on CXR without infiltrate.  -  Continue tamiflu - Continue empiric abx, PCN allergy noted. - Given ~2L fluids for sepsis and has suggested pulmonary edema and mild cardiomegaly on CXR.  - starting clear liquid diet today (to advance as tolerated) - Patient required BiPap yesterday and currently only on 2 liters - respiratory status seems stable  Sepsis: Due to suspected pulmonary source - influenza + - tamiflu started - PCCM consulted for concerns of pressors and possibly intubation - improved this am - continue close monitoring in SDU  - Follow blood cultures - UCx showing multiple species - Continue abx and tamiflu as above  DVT prophylaxis: On coumadin  Code Status: Full  Family Communication: 1st evaluation no family bedside, later son at bedside and on 3rd eval daughter at bedside Disposition Plan: Uncertain, pending improvement and may need placement- will continue to monitor   Consultants:   PCCM  Procedures:   Echocardiogram  Antimicrobials:   Ceftriaxone  Tamiflu    Subjective: Patient feeling better today.  Says he remembers coming into the hospital. Says he is hungry today and is asking if he can eat.  Denies any pain.   Objective: Vitals:   04/09/16 0600 04/09/16 0800 04/09/16 0900 04/09/16 1000  BP: (!) 109/59 (!) 112/52 (!) 106/44 (!) 97/44  Pulse: (!) 103 (!) 111 (!) 107 (!) 112  Resp: (!) 22 17 14  (!) 22  Temp:  97.3 F (36.3 C)    TempSrc:  Oral    SpO2: 96% 98% 97% 95%  Weight:      Height:        Intake/Output Summary (Last 24 hours) at 04/09/16 1213 Last  data filed at 04/09/16 1135  Gross per 24 hour  Intake              503 ml  Output              285 ml  Net              218 ml   Filed Weights   04/08/16 1200 04/09/16 0500  Weight: 71.1 kg (156 lb 12 oz) 70.6 kg (155 lb 10.3 oz)    Examination:  General exam: Appears calm and comfortable  Respiratory system: upper respiratory noise but otherwise clear, Respiratory effort normal. Nasal cannula in place Cardiovascular system: S1 & S2 heard, tachycardic. No JVD, murmurs, rubs, gallops or clicks. No pedal edema. Gastrointestinal system: Abdomen is nondistended, soft and nontender. No organomegaly or masses felt. Normal bowel sounds heard. Central nervous system: Alert and oriented. No focal neurological deficits. Extremities: able to move arms and legs independently Skin: darkened skin on LE bilaterally which patient states is chronic Psychiatry: Judgement and insight appear normal. Mood & affect appropriate.     Data Reviewed: I have personally reviewed following labs and imaging studies  CBC:  Recent Labs Lab 04/08/16 0805 04/09/16 0209  WBC 14.6* 24.3*  NEUTROABS 10.5*  --   HGB 14.0 12.6*  HCT 42.9 39.7  MCV 94.7 95.4  PLT 169 123456*   Basic Metabolic Panel:  Recent Labs Lab 04/08/16 0805 04/09/16 0209  NA 139 141  K 3.8 3.5  CL 105 110  CO2 24 24  GLUCOSE 145* 97  BUN 19 21*  CREATININE 0.84 0.90  CALCIUM 9.3 8.6*   GFR: Estimated Creatinine Clearance: 53.4 mL/min (by C-G formula based on SCr of 0.9 mg/dL). Liver Function Tests:  Recent Labs Lab 04/08/16 0805  AST 47*  ALT 15*  ALKPHOS 59  BILITOT 2.0*  PROT 7.3  ALBUMIN 4.5    Recent Labs Lab 04/08/16 0805  LIPASE 26   No results for input(s): AMMONIA in the last 168 hours. Coagulation Profile:  Recent Labs Lab 04/08/16 0805 04/09/16 0209  INR 2.33 3.68   Cardiac Enzymes:  Recent Labs Lab 04/08/16 1440 04/08/16 1940 04/09/16 0209 04/09/16 0733  TROPONINI 1.57* 2.15* 1.64*  1.57*   BNP (last 3 results) No results for input(s): PROBNP in the last 8760 hours. HbA1C: No results for input(s): HGBA1C in the last 72 hours. CBG: No results for input(s): GLUCAP in the last 168 hours. Lipid Profile: No results for input(s): CHOL, HDL, LDLCALC, TRIG, CHOLHDL, LDLDIRECT in the last 72 hours. Thyroid Function Tests: No results for input(s): TSH, T4TOTAL, FREET4, T3FREE, THYROIDAB in the last 72 hours. Anemia Panel: No results for input(s): VITAMINB12, FOLATE, FERRITIN, TIBC, IRON, RETICCTPCT in the last 72 hours. Sepsis Labs:  Recent Labs Lab 04/08/16 0830 04/08/16 1142 04/09/16 0910  LATICACIDVEN 3.25* 3.69* 1.3    Recent Results (from the past 240 hour(s))  Urine culture     Status:  Abnormal   Collection Time: 04/08/16  9:44 AM  Result Value Ref Range Status   Specimen Description URINE, CATHETERIZED  Final   Special Requests NONE  Final   Culture MULTIPLE SPECIES PRESENT, SUGGEST RECOLLECTION (A)  Final   Report Status 04/09/2016 FINAL  Final  MRSA PCR Screening     Status: None   Collection Time: 04/08/16  4:25 PM  Result Value Ref Range Status   MRSA by PCR NEGATIVE NEGATIVE Final    Comment:        The GeneXpert MRSA Assay (FDA approved for NASAL specimens only), is one component of a comprehensive MRSA colonization surveillance program. It is not intended to diagnose MRSA infection nor to guide or monitor treatment for MRSA infections.          Radiology Studies: Dg Chest 2 View  Result Date: 04/08/2016 CLINICAL DATA:  Productive cough and weakness. EXAM: CHEST  2 VIEW COMPARISON:  02/20/2015 FINDINGS: The cardiac silhouette is mildly enlarged. Mediastinal contours appear intact. Calcific atherosclerotic disease of the aorta. There is no evidence of pleural effusion or pneumothorax. Diffuse increase of the interstitial markings. Osseous structures are without acute abnormality. Soft tissues are grossly normal. IMPRESSION: Diffusely  increased interstitial markings usually associated with interstitial pulmonary edema. Electronically Signed   By: Fidela Salisbury M.D.   On: 04/08/2016 07:45   Dg Foot 2 Views Left  Result Date: 04/08/2016 CLINICAL DATA:  Fall today with left foot pain, initial encounter EXAM: LEFT FOOT - 2 VIEW COMPARISON:  None. FINDINGS: No acute fracture or dislocation is noted. Mild tarsal degenerative changes are seen. The soft tissue abnormality is noted. IMPRESSION: No acute abnormality noted. Electronically Signed   By: Inez Catalina M.D.   On: 04/08/2016 18:47        Scheduled Meds: . acetaminophen  650 mg Oral Once  . cefTRIAXone (ROCEPHIN)  IV  1 g Intravenous Q24H  . mouth rinse  15 mL Mouth Rinse BID  . oseltamivir  75 mg Oral BID  . sodium chloride flush  3 mL Intravenous Q12H  . Warfarin - Pharmacist Dosing Inpatient   Does not apply q1800   Continuous Infusions:   LOS: 1 day    Time spent: 30 minutes    Loretha Stapler, MD [facility] Pager [phone]  If 7PM-7AM, please contact night-coverage [email or web address] Password [facility] 04/09/2016, 12:13 PM

## 2016-04-10 LAB — URINE CULTURE: Culture: NO GROWTH

## 2016-04-10 LAB — PROTIME-INR
INR: 3.47
Prothrombin Time: 35.7 seconds — ABNORMAL HIGH (ref 11.4–15.2)

## 2016-04-10 MED ORDER — AMIODARONE IV BOLUS ONLY 150 MG/100ML
150.0000 mg | Freq: Once | INTRAVENOUS | Status: AC
  Administered 2016-04-10: 150 mg via INTRAVENOUS
  Filled 2016-04-10: qty 100

## 2016-04-10 MED ORDER — ACETAMINOPHEN 325 MG PO TABS: 650.0000 mg | ORAL_TABLET | Freq: Four times a day (QID) | ORAL | Status: DC | PRN

## 2016-04-10 MED ORDER — METOPROLOL TARTRATE 12.5 MG HALF TABLET
12.5000 mg | ORAL_TABLET | Freq: Once | ORAL | Status: AC
  Administered 2016-04-10: 12.5 mg via ORAL
  Filled 2016-04-10: qty 1

## 2016-04-10 NOTE — Progress Notes (Signed)
PROGRESS NOTE    Rodney Malone  [id number] DOB: October 06, 1922 DOA: 04/08/2016 PCP: [name], MD    Brief Narrative:  Rodney Malone is a 83 y.o. male with a history of AFib on coumadin and CIDP who presented for weakness, having slipped out of bed this morning.   His wife reports that he's had a cough for many months that became productive of sputum over the past 24 hours without chest pain, but has noticed some dyspnea. During this time she's also noticed gradual onset of worsening, constant weakness. He usually uses a walker at home and has been requiring increased assistance for ambulation and a wheelchair at times. Overnight he got up to the bathroom with significant assistance from his wife and a walker, but safely made it back to bed. Later she saw him trying to get up, sitting on the side of the bed holding on to the railing when he slid down the side. No LOC, head trauma or ensuing pain but they were unable to get him up and called the fire department for assistance. He was found hypoxic in the 80%'s and placed on oxygen.   ED Course: On arrival he was tachypneic and found to be in AFib with RVR, HR up to 169. Rectal temperature 103.21F. WBC 14.6, lactate 3.25, troponin 0.06. CXR showed pulmonary edema and UA is pending. He was started on BiPAP and TRH asked to admit.   PCCM was consulted and patient need amiodarone bolus x 1 overnight to control heart rate.  Troponins elevated but likely from demand ischemia. Patient was able to titrate off BiPap and was stable on nasal cannula oxygen.   Assessment & Plan:   Active Problems:   Mitral regurgitation due to cusp prolapse   Hyperlipidemia   CIDP (chronic inflammatory demyelinating polyneuropathy) (HCC)   Long term current use of anticoagulant therapy   Atrial fibrillation with RVR (HCC)   Fever   Acute cystitis without hematuria   Sepsis ([place])   Influenza with pneumonia   Atrial fibrillation with rapid ventricular response:   - Dilt gtt started, titrate to effect HR < 120. Worry about decreasing output but currently hypertensive. Last EF preserved in 2015 (diltiazem later stopped 2/2 hypotension) - INR therapeutic, continue coumadin - cardiac enzymes peaked at 2.15 and downtrending (elevation likely from demand ischemia 2/2 hypotension, sepsis and A.fib with RVR) - Echocardiogram showing: Technically difficult study with poor acoustic windows. The patient was in atrial fibrillation. Normal LV size with mild LV hypertrophy. EF 50%, diffuse hypokinesis. Suspect severe mitral regurgitation. Jet not fully visualized due to eccentricity (anteriorly directed). Valve not fully visualized due to poor image quality. Suspect partial flail posterior leaflet (P1/P2). Normal RV size and systolic function. Moderate pulmonary hypertension. There is at least mild aortic stenosis. The aortic valve doppler is difficult to interpret and needs to be re-done - will start amiodarone   Acute hypoxemic respiratory failure: Diffuse interstitial edema on CXR without infiltrate.  -  Continue tamiflu - Continue empiric abx, PCN allergy noted. - Given ~2L fluids for sepsis and has suggested pulmonary edema and mild cardiomegaly on CXR.  - Patient required BiPap on day of admission and currently only on room air - respiratory status seems stable  Sepsis: Due to influenza and UTI - influenza + - tamiflu started - PCCM consulted for concerns of pressors and possibly intubation - continue close monitoring in SDU  - Follow blood cultures - UCx pending - Continue abx and tamiflu as above -  patient improving but not at baseline  DVT prophylaxis: On coumadin  Code Status: Full  Family Communication: no family at bedside Disposition Plan: Uncertain, pending improvement and may need placement- will continue to monitor   Consultants:   PCCM  Procedures:   Echocardiogram  Antimicrobials:   Ceftriaxone  Tamiflu     Subjective: Patient reports he feels slightly better this morning.  Asking if he can stop all the monitors beeping as he did not sleep well last night.  Voices that other than that he just has diffuse body aches.  Needed IV bolus of amiodarone again last night.    Objective: Vitals:   04/10/16 0400 04/10/16 0500 04/10/16 0600 04/10/16 0800  BP: (!) 142/79  (!) 146/82 (!) 115/48  Pulse: (!) 122  (!) 111 (!) 112  Resp: (!) 22  (!) 21 19  Temp: 97.3 F (36.3 C)     TempSrc: Oral     SpO2: 92%  94% 95%  Weight:  69.4 kg (153 lb)    Height:        Intake/Output Summary (Last 24 hours) at 04/10/16 0805 Last data filed at 04/10/16 0600  Gross per 24 hour  Intake              423 ml  Output              900 ml  Net             -477 ml   Filed Weights   04/08/16 1200 04/09/16 0500 04/10/16 0500  Weight: 71.1 kg (156 lb 12 oz) 70.6 kg (155 lb 10.3 oz) 69.4 kg (153 lb)    Examination:  General exam: Appears calm and comfortable  Respiratory system: clear bilaterally, Respiratory effort normal. Nasal cannula in place Cardiovascular system: S1 & S2 heard, tachycardic, irregular rhythm. No JVD, rubs, gallops or clicks. No pedal edema. Gastrointestinal system: Abdomen is nondistended, soft and nontender. No organomegaly or masses felt. Normal bowel sounds heard. Central nervous system: Alert and oriented. No focal neurological deficits. Extremities: able to move arms and legs independently Skin: darkened skin on distal lower extremities bilaterally which patient states is chronic Psychiatry: Judgement and insight appear normal. Mood & affect appropriate.     Data Reviewed: I have personally reviewed following labs and imaging studies  CBC:  Recent Labs Lab 04/08/16 0805 04/09/16 0209  WBC 14.6* 24.3*  NEUTROABS 10.5*  --   HGB 14.0 12.6*  HCT 42.9 39.7  MCV 94.7 95.4  PLT 169 123456*   Basic Metabolic Panel:  Recent Labs Lab 04/08/16 0805 04/09/16 0209  NA 139 141   K 3.8 3.5  CL 105 110  CO2 24 24  GLUCOSE 145* 97  BUN 19 21*  CREATININE 0.84 0.90  CALCIUM 9.3 8.6*   GFR: Estimated Creatinine Clearance: 52.5 mL/min (by C-G formula based on SCr of 0.9 mg/dL). Liver Function Tests:  Recent Labs Lab 04/08/16 0805  AST 47*  ALT 15*  ALKPHOS 59  BILITOT 2.0*  PROT 7.3  ALBUMIN 4.5    Recent Labs Lab 04/08/16 0805  LIPASE 26   No results for input(s): AMMONIA in the last 168 hours. Coagulation Profile:  Recent Labs Lab 04/08/16 0805 04/09/16 0209 04/10/16 0354  INR 2.33 3.68 3.47   Cardiac Enzymes:  Recent Labs Lab 04/08/16 1440 04/08/16 1940 04/09/16 0209 04/09/16 0733 04/09/16 1353  TROPONINI 1.57* 2.15* 1.64* 1.57* 1.37*   BNP (last 3 results) No results for input(s): PROBNP  in the last 8760 hours. HbA1C: No results for input(s): HGBA1C in the last 72 hours. CBG: No results for input(s): GLUCAP in the last 168 hours. Lipid Profile: No results for input(s): CHOL, HDL, LDLCALC, TRIG, CHOLHDL, LDLDIRECT in the last 72 hours. Thyroid Function Tests: No results for input(s): TSH, T4TOTAL, FREET4, T3FREE, THYROIDAB in the last 72 hours. Anemia Panel: No results for input(s): VITAMINB12, FOLATE, FERRITIN, TIBC, IRON, RETICCTPCT in the last 72 hours. Sepsis Labs:  Recent Labs Lab 04/08/16 0830 04/08/16 1142 04/09/16 0910  LATICACIDVEN 3.25* 3.69* 1.3    Recent Results (from the past 240 hour(s))  Blood Culture (routine x 2)     Status: None (Preliminary result)   Collection Time: 04/08/16  8:21 AM  Result Value Ref Range Status   Specimen Description BLOOD LEFT ARM  Final   Special Requests BOTTLES DRAWN AEROBIC AND ANAEROBIC 5 CC EACH  Final   Culture   Final    NO GROWTH 1 DAY Performed at [facility], [address] [place], [place] [address]    Report Status PENDING  Incomplete  Blood Culture (routine x 2)     Status: None (Preliminary result)   Collection Time: 04/08/16  9:04 AM  Result  Value Ref Range Status   Specimen Description BLOOD BLOOD LEFT FOREARM  Final   Special Requests BOTTLES DRAWN AEROBIC ONLY 9 CC  Final   Culture   Final    NO GROWTH 1 DAY Performed at [facility], [address] [place], [place] [address]    Report Status PENDING  Incomplete  Urine culture     Status: Abnormal   Collection Time: 04/08/16  9:44 AM  Result Value Ref Range Status   Specimen Description URINE, CATHETERIZED  Final   Special Requests NONE  Final   Culture MULTIPLE SPECIES PRESENT, SUGGEST RECOLLECTION (A)  Final   Report Status 04/09/2016 FINAL  Final  MRSA PCR Screening     Status: None   Collection Time: 04/08/16  4:25 PM  Result Value Ref Range Status   MRSA by PCR NEGATIVE NEGATIVE Final    Comment:        The GeneXpert MRSA Assay (FDA approved for NASAL specimens only), is one component of a comprehensive MRSA colonization surveillance program. It is not intended to diagnose MRSA infection nor to guide or monitor treatment for MRSA infections.          Radiology Studies: Dg Foot 2 Views Left  Result Date: 04/08/2016 CLINICAL DATA:  Fall today with left foot pain, initial encounter EXAM: LEFT FOOT - 2 VIEW COMPARISON:  None. FINDINGS: No acute fracture or dislocation is noted. Mild tarsal degenerative changes are seen. The soft tissue abnormality is noted. IMPRESSION: No acute abnormality noted. Electronically Signed   By: Inez Catalina M.D.   On: 04/08/2016 18:47        Scheduled Meds: . acetaminophen  650 mg Oral Once  . cefTRIAXone (ROCEPHIN)  IV  1 g Intravenous Q24H  . mouth rinse  15 mL Mouth Rinse BID  . oseltamivir  30 mg Oral BID  . sodium chloride flush  3 mL Intravenous Q12H  . Warfarin - Pharmacist Dosing Inpatient   Does not apply q1800   Continuous Infusions:   LOS: 2 days    Time spent: 30 minutes    Loretha Stapler, MD [facility] Pager [phone]  If 7PM-7AM, please contact  night-coverage [email or web address] Password [email or web address] 04/10/2016, 8:05 AM

## 2016-04-10 NOTE — Progress Notes (Signed)
[facility] for warfarin Indication: atrial fibrillation  Allergies  Allergen Reactions  . Penicillins Other (See Comments)    Has patient had a PCN reaction causing immediate rash, facial/tongue/throat swelling, SOB or lightheadedness with hypotension: no Has patient had a PCN reaction causing severe rash involving mucus membranes or skin necrosis: no Has patient had a PCN reaction that required hospitalization no Has patient had a PCN reaction occurring within the last 10 years: no If all of the above answers are "NO", then may proceed with Cephalosporin use.     Patient Measurements: Height: 6' (182.9 cm) Weight: 153 lb (69.4 kg) IBW/kg (Calculated) : 77.6  Vital Signs: Temp: 98 F (36.7 C) (01/30 1200) Temp Source: Oral (01/30 1200) BP: 124/75 (01/30 1200) Pulse Rate: 107 (01/30 1200)  Labs:  Recent Labs  04/08/16 0805  04/09/16 0209 04/09/16 0733 04/09/16 1353 04/10/16 0354  HGB 14.0  --  12.6*  --   --   --   HCT 42.9  --  39.7  --   --   --   PLT 169  --  148*  --   --   --   LABPROT 26.0*  --  37.4*  --   --  35.7*  INR 2.33  --  3.68  --   --  3.47  CREATININE 0.84  --  0.90  --   --   --   TROPONINI  --   < > 1.64* 1.57* 1.37*  --   < > = values in this interval not displayed.  Estimated Creatinine Clearance: 52.5 mL/min (by C-G formula based on SCr of 0.9 mg/dL).  Assessment: 66 YOM presents with weakness and influenza.  History of afib on warfarin at home.  INR therapeutic at time of admission  Home dose: warfarin 2mg  daily (last dose 1/27)  Significant events: 1/29: Troponins elevated overnight; given Lovenox 1 mg/kg x 1 with concern for ACS/STEMI, but now suspect demand ischemia only. Lovenox not continued.  Today, 04/10/2016  INR remains elevated but trending down.  CBC: Hgb and Plt both slightly low  Recent doses of amiodarone and Levaquin; MD still deciding between rate control with amiodarone vs  metoprolol  Clear liquid diet ordered; minimal intake  Goal of Therapy:  INR 2-3 Monitor platelets by anticoagulation protocol: Yes   Plan:   Hold warfarin again tonight  Daily INR; CBC at least q72 hrs while on warfarin inpatient  Monitor for signs of bleeding or thrombosis  Reuel Boom, PharmD, BCPS Pager: [phone] 04/10/2016, 1:37 PM

## 2016-04-10 NOTE — Progress Notes (Signed)
Patient's heart rate gradually increasing throughout shift and sustaining in the 130s with frequent increases to the 140s.  [facility] NP paged, orders for 150mg  IV amiodarone bolus received and given.  Heart rate now 110, BP 130/72. Will continue to monitor.

## 2016-04-11 DIAGNOSIS — Z7901 Long term (current) use of anticoagulants: Secondary | ICD-10-CM

## 2016-04-11 LAB — PROTIME-INR
INR: 3.63
PROTHROMBIN TIME: 37.1 s — AB (ref 11.4–15.2)

## 2016-04-11 MED ORDER — METOPROLOL TARTRATE 5 MG/5ML IV SOLN: 2.5000 mg | Freq: Four times a day (QID) | INTRAVENOUS | Status: DC | PRN

## 2016-04-11 MED ORDER — METOPROLOL TARTRATE 5 MG/5ML IV SOLN
2.5000 mg | Freq: Once | INTRAVENOUS | Status: AC
  Administered 2016-04-11: 2.5 mg via INTRAVENOUS
  Filled 2016-04-11: qty 5

## 2016-04-11 MED ORDER — METOPROLOL TARTRATE 25 MG PO TABS
25.0000 mg | ORAL_TABLET | Freq: Two times a day (BID) | ORAL | Status: DC
  Administered 2016-04-11 – 2016-04-12 (×2): 25 mg via ORAL
  Filled 2016-04-11 (×2): qty 1

## 2016-04-11 NOTE — Clinical Social Work Note (Signed)
Clinical Social Work Assessment  Patient Details  Name: Rodney Malone MRN: [id number] Date of Birth: Nov 27, 1922  Date of referral:  04/11/16               Reason for consult:  Facility Placement                Permission sought to share information with:  Chartered certified accountant granted to share information::  Yes, Verbal Permission Granted  Name::        Agency::     Relationship::     Contact Information:     Housing/Transportation Living arrangements for the past 2 months:  [place] of Information:  Spouse Patient Interpreter Needed:  None Criminal Activity/Legal Involvement Pertinent to Current Situation/Hospitalization:  No - Comment as needed Significant Relationships:  Adult Children, Spouse Lives with:  Spouse Do you feel safe going back to the place where you live?  No Need for family participation in patient care:  Yes (Comment)  Care giving concerns:  CSW received consult that patient was admitted from [place] at [facility]. CSW reviewed PT evaluation recommending SNF. CSW confirmed with patient's wife, Rodney Malone that she is agreeable that he go to rehab. CSW awaiting call back from [place] at [facility] re: bed availability.    Social Worker assessment / plan:    Employment status:  Retired Forensic scientist:  [employer] PT Recommendations:  [place] / Referral to community resources:  [place]  Patient/Family's Response to care:    Patient/Family's Understanding of and Emotional Response to Diagnosis, Current Treatment, and Prognosis:    Emotional Assessment Appearance:  Appears stated age Attitude/Demeanor/Rapport:    Affect (typically observed):    Orientation:  Oriented to Self, Oriented to Place Alcohol / Substance use:    Psych involvement (Current and /or in the community):     Discharge Needs  Concerns to be addressed:    Readmission within the  last 30 days:    Current discharge risk:    Barriers to Discharge:      Standley Brooking, LCSW 04/11/2016, 3:43 PM

## 2016-04-11 NOTE — Evaluation (Signed)
Physical Therapy Evaluation Patient Details Name: Rodney Malone MRN: [id number] DOB: [birth year] Today's Date: 04/11/2016   History of Present Illness  83 Y.O. male admitted with fall, weakness. Dx of flu and PNA. PMH of chronic inflammatory demyelinating polyneuropathy, a fib.   Clinical Impression  Pt admitted with above diagnosis. Pt currently with functional limitations due to the deficits listed below (see PT Problem List). Max assist for bed mobility, pt ambulated 83' with RW.  Pt will benefit from skilled PT to increase their independence and safety with mobility to allow discharge to the venue listed below.       Follow Up Recommendations SNF;Supervision for mobility/OOB    Equipment Recommendations  None recommended by PT    Recommendations for Other Services       Precautions / Restrictions Precautions Precautions: Fall Precaution Comments: pt reports several falls in the past few years 2* dizziness Required Braces or Orthoses: Other Brace/Splint Other Brace/Splint: B AFOs Restrictions Weight Bearing Restrictions: No      Mobility  Bed Mobility Overal bed mobility: Needs Assistance Bed Mobility: Rolling;Sidelying to Sit Rolling: Mod assist Sidelying to sit: Max assist       General bed mobility comments: mod A to roll R, max A to raise trunk  Transfers Overall transfer level: Needs assistance Equipment used: Rolling walker (2 wheeled) Transfers: Sit to/from Stand Sit to Stand: Mod assist         General transfer comment: mod A to rise/steady  Ambulation/Gait Ambulation/Gait assistance: Min guard Ambulation Distance (Feet): 85 Feet Assistive device: Rolling walker (2 wheeled) Gait Pattern/deviations: Step-through pattern;Narrow base of support;Trunk flexed   Gait velocity interpretation: at or above normal speed for age/gender General Gait Details: min/guard A for safety 2* h/o falls, no LOB; HR 119 walking, 2/4 dyspnea, distance limited by  fatigue  Stairs            Wheelchair Mobility    Modified Rankin (Stroke Patients Only)       Balance Overall balance assessment: Needs assistance;History of Falls   Sitting balance-Leahy Scale: Good       Standing balance-Leahy Scale: Poor Standing balance comment: requires BUE support                             Pertinent Vitals/Pain Pain Assessment: No/denies pain    Home Living Family/patient expects to be discharged to:: Private residence Living Arrangements: Spouse/significant other Available Help at Discharge: Family;Available 24 hours/day Type of Home: Independent living facility       Home Layout: One level Home Equipment: [place] - 4 wheels;Other (comment);Shower seat (B AFOs) Additional Comments: pt/wife live in ILF at [facility]    Prior Function Level of Independence: Needs assistance         Comments: walks with rollator, stopped driving last year wife still drives, wife helps with feet for bathing/dressing     Hand Dominance        Extremity/Trunk Assessment        Lower Extremity Assessment Lower Extremity Assessment: Overall WFL for tasks assessed (knee ext 4/5 B, chronic B foot drop)    Cervical / Trunk Assessment Cervical / Trunk Assessment: Kyphotic  Communication   Communication: No difficulties  Cognition Arousal/Alertness: Awake/alert Behavior During Therapy: WFL for tasks assessed/performed                        General Comments      Exercises  Assessment/Plan    PT Assessment Patient needs continued PT services  PT Problem List Decreased activity tolerance;Decreased balance;Decreased mobility          PT Treatment Interventions Gait training;Functional mobility training;Balance training;Therapeutic exercise;Patient/family education;Therapeutic activities    PT Goals (Current goals can be found in the Care Plan section)  Acute Rehab PT Goals Patient Stated Goal: minimize  falls PT Goal Formulation: With patient Time For Goal Achievement: 04/25/16 Potential to Achieve Goals: Good    Frequency Min 3X/week   Barriers to discharge        Co-evaluation               End of Session Equipment Utilized During Treatment: Gait belt Activity Tolerance: Patient tolerated treatment well Patient left: in chair;with call bell/phone within reach;with chair alarm set Nurse Communication: Mobility status         Time: 1126-1201 PT Time Calculation (min) (ACUTE ONLY): 35 min   Charges:   PT Evaluation $PT Eval Moderate Complexity: 1 Procedure PT Treatments $Gait Training: 8-22 mins   PT G Codes:        Philomena Doheny 04/11/2016, 12:33 PM [phone]

## 2016-04-11 NOTE — Progress Notes (Signed)
[facility] for warfarin Indication: atrial fibrillation  Allergies  Allergen Reactions  . Penicillins Other (See Comments)    Has patient had a PCN reaction causing immediate rash, facial/tongue/throat swelling, SOB or lightheadedness with hypotension: no Has patient had a PCN reaction causing severe rash involving mucus membranes or skin necrosis: no Has patient had a PCN reaction that required hospitalization no Has patient had a PCN reaction occurring within the last 10 years: no If all of the above answers are "NO", then may proceed with Cephalosporin use.     Patient Measurements: Height: 6' (182.9 cm) Weight: 152 lb 8.9 oz (69.2 kg) IBW/kg (Calculated) : 77.6  Vital Signs: Temp: 98 F (36.7 C) (01/31 0632) Temp Source: Oral (01/31 [id number]) BP: 127/76 (01/31 0906) Pulse Rate: 95 (01/31 0906)  Labs:  Recent Labs  04/09/16 0209 04/09/16 0733 04/09/16 1353 04/10/16 0354 04/11/16 0530  HGB 12.6*  --   --   --   --   HCT 39.7  --   --   --   --   PLT 148*  --   --   --   --   LABPROT 37.4*  --   --  35.7* 37.1*  INR 3.68  --   --  3.47 3.63  CREATININE 0.90  --   --   --   --   TROPONINI 1.64* 1.57* 1.37*  --   --     Estimated Creatinine Clearance: 52.3 mL/min (by C-G formula based on SCr of 0.9 mg/dL).  Assessment: 34 YOM presents with weakness and influenza.  History of afib on warfarin at home.  INR therapeutic at time of admission  Home dose: warfarin 2mg  daily (last dose 1/27)  Significant events: 1/29: Troponins elevated overnight; given Lovenox 1 mg/kg x 1 with concern for ACS/STEMI, but now suspect demand ischemia only. Lovenox not continued.  Today, 04/11/2016  INR 3.63, remains elevated and increased despite no warfarin doses.    CBC: Hgb and Plt both slightly low (last on 1/29)  Diet: Heart healthy; minimal intake  Drug-drug interactions:  recent dose Levaquin (1/28), now on Ceftriaxone.  Recent doses of  Amiodarone IV bolus (1/28, 1/30) for Afib, currently on metoprolol only.  Goal of Therapy:  INR 2-3 Monitor platelets by anticoagulation protocol: Yes   Plan:   Hold warfarin tonight  Daily INR; CBC at least q72 hrs while on warfarin inpatient  Monitor for signs of bleeding or thrombosis   Gretta Arab PharmD, BCPS Pager [phone] 04/11/2016 12:15 PM

## 2016-04-11 NOTE — NC FL2 (Signed)
[place] LEVEL OF CARE SCREENING TOOL     IDENTIFICATION  Patient Name: Rodney Malone Birthdate: [birth year] Sex: male Admission Date (Current Location): 04/08/2016  [facility] and [place] Number:  Herbalist and Address:  [facility],  [place], [place]      Provider Number: [id number]  Attending Physician Name and Address:  Charlynne Cousins, MD  Relative Name and Phone Number:       Current Level of Care: Hospital Recommended Level of Care: [place] Prior Approval Number:    Date Approved/Denied:   PASRR Number:    Discharge Plan: SNF    Current Diagnoses: Patient Active Problem List   Diagnosis Date Noted  . Influenza with pneumonia   . Atrial fibrillation with RVR ([place]) 04/08/2016  . Fever   . Acute cystitis without hematuria   . Sepsis ([place])   . Memory loss 04/05/2015  . Mild closed head injury 07/15/2013  . CIDP (chronic inflammatory demyelinating polyneuropathy) ([place])   . Unsteady gait   . Long term current use of anticoagulant therapy   . Mitral regurgitation due to cusp prolapse   . Atrial fibrillation ([place])   . Hyperlipidemia     Orientation RESPIRATION BLADDER Height & Weight     Self, Time  Normal Incontinent, External catheter Weight: 152 lb 8.9 oz (69.2 kg) Height:  6' (182.9 cm)  BEHAVIORAL SYMPTOMS/MOOD NEUROLOGICAL BOWEL NUTRITION STATUS      Incontinent Diet (Heart)  AMBULATORY STATUS COMMUNICATION OF NEEDS Skin   Extensive Assist Verbally Normal                       Personal Care Assistance Level of Assistance  Bathing, Dressing Bathing Assistance: Limited assistance   Dressing Assistance: Limited assistance     Functional Limitations Info             SPECIAL CARE FACTORS FREQUENCY  PT (By licensed PT), OT (By licensed OT)     PT Frequency: 5 OT Frequency: 5            Contractures      Additional Factors Info  Code Status, Allergies Code  Status Info: Fullcode Allergies Info: Pencillins           Current Medications (04/11/2016):  This is the current hospital active medication list Current Facility-Administered Medications  Medication Dose Route Frequency Provider Last Rate Last Dose  . acetaminophen (TYLENOL) tablet 650 mg  650 mg Oral Once Daleen Bo, MD      . acetaminophen (TYLENOL) tablet 650 mg  650 mg Oral Q6H PRN Eber Staten, MD      . bisacodyl (DULCOLAX) EC tablet 5 mg  5 mg Oral Daily PRN Donita Brooks, NP      . cefTRIAXone (ROCEPHIN) 1 g in dextrose 5 % 50 mL IVPB  1 g Intravenous Q24H Patrecia Pour, MD   1 g at 04/10/16 1738  . MEDLINE mouth rinse  15 mL Mouth Rinse BID Patrecia Pour, MD   15 mL at 04/11/16 0903  . metoprolol (LOPRESSOR) injection 2.5 mg  2.5 mg Intravenous Q6H PRN Charlynne Cousins, MD      . metoprolol tartrate (LOPRESSOR) tablet 25 mg  25 mg Oral BID Charlynne Cousins, MD      . morphine 2 MG/ML injection 2-4 mg  2-4 mg Intravenous Q4H PRN Patrecia Pour, MD   2 mg at 04/10/16  2256  . oseltamivir (TAMIFLU) capsule 30 mg  30 mg Oral BID Polly Cobia, RPH   30 mg at 04/11/16 0911  . sodium chloride flush (NS) 0.9 % injection 3 mL  3 mL Intravenous Q12H Patrecia Pour, MD   3 mL at 04/11/16 0903  . Warfarin - Pharmacist Dosing Inpatient   Does not apply [place], [facility] [place] [facility]         Discharge Medications: Please see discharge summary for a list of discharge medications.  Relevant Imaging Results:  Relevant Lab Results:   Additional Information SSN: [id number]  Standley Brooking, LCSW

## 2016-04-11 NOTE — Progress Notes (Addendum)
[facility] PROGRESS NOTE    Progress Note  Rodney Malone  [id number] DOB: 17-Jan-1923 DOA: 04/08/2016 PCP: [name], MD     Brief Narrative:   Rodney Malone is an 83 y.o. male with a history of AFib on coumadin and CIDP who presented for weakness, having slipped out of bed this morning.  His wife reports that he's had a cough for many months that became productive of sputum over the past 24 hours without chest pain, but has noticed some dyspnea. During this time she's also noticed gradual onset of worsening, constant weakness. He usually uses a walker at home and has been requiring increased assistance for ambulation and a wheelchair at times. Overnight he got up to the bathroom with significant assistance from his wife and a walker, but safely made it back to bed. Later she saw him trying to get up, sitting on the side of the bed holding on to the railing when he slid down the side. No LOC, head trauma or ensuing pain but they were unable to get him up and called the fire department for assistance. He was found hypoxic in the 80%'s and placed on oxygen.   Assessment/Plan:   SIRS Due to influenza and UTI - tamiflu started - Blood cultures and urine cultures remain negative till date. - Continue abx and tamiflu as above - Patient improving but not at baseline. - Leucytosis continues to be high.  Atrial fibrillation with rapid ventricular response:  - INR therapeutic, continue coumadin - cardiac enzymes peaked at 2.15 and downtrending (elevation likely from demand ischemia 2/2 hypotension, sepsis and A.fib with RVR) Increase metoprolol Twice a day and IV when necessary for for a HR > 100  Elevated troponins: - Mild elevation in cardiac biomarkers likely due to demand ischemia in the setting of RVR answers. - 2-D echo was done that showed no motion abnormalities and EF of 50% with mild diffuse hypokinesia  Acute hypoxemic respiratory failure with hypoxia:  - Diffuse  interstitial edema on CXR without infiltrate.  - Continue tamiflu - Continue empiric abx, PCN allergy noted. - Respiratory status seems stable   DVT prophylaxis: coumadin Family Communication:none Disposition Plan/Barrier to D/C: home in 2 days Code Status:     Code Status Orders        Start     Ordered   04/08/16 1231  Full code  Continuous     04/08/16 1230    Code Status History    Date Active Date Inactive Code Status Order ID Comments User Context   This patient has a current code status but no historical code status.        IV Access:    Peripheral IV   Procedures and diagnostic studies:   No results found.   Medical Consultants:    None.  Anti-Infectives:   Rocephin and azithromycin  Subjective:    Rodney Malone uterus feels much better compared to yesterday continues to have a productive cough with streaks of blood.  Objective:    Vitals:   04/10/16 1700 04/10/16 1745 04/10/16 2059 04/11/16 0632  BP:  (!) 141/72 114/72 105/61  Pulse:  (!) 102 (!) 114 96  Resp:  18 20 (!) 21  Temp: 98 F (36.7 C) 98 F (36.7 C) 98.4 F (36.9 C) 98 F (36.7 C)  TempSrc: Oral Oral Oral Oral  SpO2:  97% 93% 90%  Weight:    69.2 kg (152 lb 8.9 oz)  Height:  Intake/Output Summary (Last 24 hours) at 04/11/16 0833 Last data filed at 04/11/16 0631  Gross per 24 hour  Intake              120 ml  Output              350 ml  Net             -230 ml   Filed Weights   04/09/16 0500 04/10/16 0500 04/11/16 [id number]  Weight: 70.6 kg (155 lb 10.3 oz) 69.4 kg (153 lb) 69.2 kg (152 lb 8.9 oz)    Exam: General exam: In no acute distress. Respiratory system: Good air movement and clear to auscultation. Cardiovascular system: S1 & S2 heard, RRR. No JVD. Gastrointestinal system: Abdomen is nondistended, soft and nontender.  Extremities: No pedal edema. Skin: No rashes, lesions or ulcers Psychiatry: Judgement and insight appear normal. Mood & affect  appropriate.    Data Reviewed:    Labs: Basic Metabolic Panel:  Recent Labs Lab 04/08/16 0805 04/09/16 0209  NA 139 141  K 3.8 3.5  CL 105 110  CO2 24 24  GLUCOSE 145* 97  BUN 19 21*  CREATININE 0.84 0.90  CALCIUM 9.3 8.6*   GFR Estimated Creatinine Clearance: 52.3 mL/min (by C-G formula based on SCr of 0.9 mg/dL). Liver Function Tests:  Recent Labs Lab 04/08/16 0805  AST 47*  ALT 15*  ALKPHOS 59  BILITOT 2.0*  PROT 7.3  ALBUMIN 4.5    Recent Labs Lab 04/08/16 0805  LIPASE 26   No results for input(s): AMMONIA in the last 168 hours. Coagulation profile  Recent Labs Lab 04/08/16 0805 04/09/16 0209 04/10/16 0354 04/11/16 0530  INR 2.33 3.68 3.47 3.63    CBC:  Recent Labs Lab 04/08/16 0805 04/09/16 0209  WBC 14.6* 24.3*  NEUTROABS 10.5*  --   HGB 14.0 12.6*  HCT 42.9 39.7  MCV 94.7 95.4  PLT 169 148*   Cardiac Enzymes:  Recent Labs Lab 04/08/16 1440 04/08/16 1940 04/09/16 0209 04/09/16 0733 04/09/16 1353  TROPONINI 1.57* 2.15* 1.64* 1.57* 1.37*   BNP (last 3 results) No results for input(s): PROBNP in the last 8760 hours. CBG: No results for input(s): GLUCAP in the last 168 hours. D-Dimer: No results for input(s): DDIMER in the last 72 hours. Hgb A1c: No results for input(s): HGBA1C in the last 72 hours. Lipid Profile: No results for input(s): CHOL, HDL, LDLCALC, TRIG, CHOLHDL, LDLDIRECT in the last 72 hours. Thyroid function studies: No results for input(s): TSH, T4TOTAL, T3FREE, THYROIDAB in the last 72 hours.  Invalid input(s): FREET3 Anemia work up: No results for input(s): VITAMINB12, FOLATE, FERRITIN, TIBC, IRON, RETICCTPCT in the last 72 hours. Sepsis Labs:  Recent Labs Lab 04/08/16 0805 04/08/16 0830 04/08/16 1142 04/09/16 0209 04/09/16 0910  WBC 14.6*  --   --  24.3*  --   LATICACIDVEN  --  3.25* 3.69*  --  1.3   Microbiology Recent Results (from the past 240 hour(s))  Blood Culture (routine x 2)      Status: None (Preliminary result)   Collection Time: 04/08/16  8:21 AM  Result Value Ref Range Status   Specimen Description BLOOD LEFT ARM  Final   Special Requests BOTTLES DRAWN AEROBIC AND ANAEROBIC 5 CC EACH  Final   Culture   Final    NO GROWTH 2 DAYS Performed at [facility], [place] [address], [place], [place] [address]    Report Status PENDING  Incomplete  Blood Culture (routine x 2)     Status: None (Preliminary result)   Collection Time: 04/08/16  9:04 AM  Result Value Ref Range Status   Specimen Description BLOOD BLOOD LEFT FOREARM  Final   Special Requests BOTTLES DRAWN AEROBIC ONLY 9 CC  Final   Culture   Final    NO GROWTH 2 DAYS Performed at [facility], [address], [place], [place] [address]    Report Status PENDING  Incomplete  Urine culture     Status: Abnormal   Collection Time: 04/08/16  9:44 AM  Result Value Ref Range Status   Specimen Description URINE, CATHETERIZED  Final   Special Requests NONE  Final   Culture MULTIPLE SPECIES PRESENT, SUGGEST RECOLLECTION (A)  Final   Report Status 04/09/2016 FINAL  Final  MRSA PCR Screening     Status: None   Collection Time: 04/08/16  4:25 PM  Result Value Ref Range Status   MRSA by PCR NEGATIVE NEGATIVE Final    Comment:        The GeneXpert MRSA Assay (FDA approved for NASAL specimens only), is one component of a comprehensive MRSA colonization surveillance program. It is not intended to diagnose MRSA infection nor to guide or monitor treatment for MRSA infections.   Urine culture     Status: None   Collection Time: 04/09/16 12:08 PM  Result Value Ref Range Status   Specimen Description URINE, CLEAN CATCH  Final   Special Requests NONE  Final   Culture   Final    NO GROWTH Performed at [facility], [address], [place], [place] [address]    Report Status 04/10/2016 FINAL  Final     Medications:   . acetaminophen  650 mg Oral Once  . cefTRIAXone (ROCEPHIN)  IV  1 g  Intravenous Q24H  . mouth rinse  15 mL Mouth Rinse BID  . oseltamivir  30 mg Oral BID  . sodium chloride flush  3 mL Intravenous Q12H  . Warfarin - Pharmacist Dosing Inpatient   Does not apply q1800   Continuous Infusions:  Time spent: 25 min   LOS: 3 days   Charlynne Cousins  [facility] Pager [phone]  *Please refer to [email or web address], password [email or web address] to get updated schedule on who will round on this patient, as hospitalists switch teams weekly. If 7PM-7AM, please contact night-coverage at [email or web address], password [email or web address] for any overnight needs.  04/11/2016, 8:33 AM

## 2016-04-12 LAB — BASIC METABOLIC PANEL
Anion gap: 9 (ref 5–15)
BUN: 14 mg/dL (ref 6–20)
CO2: 25 mmol/L (ref 22–32)
CREATININE: 0.7 mg/dL (ref 0.61–1.24)
Calcium: 8.9 mg/dL (ref 8.9–10.3)
Chloride: 107 mmol/L (ref 101–111)
GFR calc Af Amer: >60 mL/min (ref >60)
GFR calc non Af Amer: >60 mL/min (ref >60)
Glucose, Bld: 97 mg/dL (ref 65–99)
Potassium: 3.1 mmol/L — ABNORMAL LOW (ref 3.5–5.1)
SODIUM: 141 mmol/L (ref 135–145)

## 2016-04-12 LAB — CBC
HCT: 37.2 % — ABNORMAL LOW (ref 39.0–52.0)
HEMOGLOBIN: 12.1 g/dL — AB (ref 13.0–17.0)
MCH: 30.4 pg (ref 26.0–34.0)
MCHC: 32.5 g/dL (ref 30.0–36.0)
MCV: 93.5 fL (ref 78.0–100.0)
Platelets: 148 10*3/uL — ABNORMAL LOW (ref 150–400)
RBC: 3.98 MIL/uL — ABNORMAL LOW (ref 4.22–5.81)
RDW: 14.7 % (ref 11.5–15.5)
WBC: 6.3 10*3/uL (ref 4.0–10.5)

## 2016-04-12 LAB — PROTIME-INR
INR: 3.32
PROTHROMBIN TIME: 34.5 s — AB (ref 11.4–15.2)

## 2016-04-12 MED ORDER — METOPROLOL TARTRATE 25 MG PO TABS: 25.0000 mg | ORAL_TABLET | Freq: Two times a day (BID) | ORAL | 1 refills | Status: DC

## 2016-04-12 MED ORDER — OSELTAMIVIR PHOSPHATE 30 MG PO CAPS: 30.0000 mg | ORAL_CAPSULE | Freq: Two times a day (BID) | ORAL | 0 refills | Status: DC

## 2016-04-12 NOTE — Discharge Summary (Signed)
Physician Discharge Summary  Rodney Malone [id number] DOB: 07-25-1922 DOA: 04/08/2016  PCP: [name], MD  Admit date: 04/08/2016 Discharge date: 04/12/2016  Admitted From: home Disposition:  Home  Recommendations for Outpatient Follow-up:  1. Follow up with PCP in 1-2 weeks 2. Please obtain BMP/CBC in one week   Home Health:No Equipment/Devices:none  Discharge Condition:stable CODE STATUS:full Diet recommendation: Heart Healthy   Brief/Interim Summary: Per MD: 83 y.o. male with a history of AFib on coumadin and CIDP who presented for weakness, having slipped out of bed this morning.  His wife reports that he's had a cough for many months that became productive of sputum over the past 24 hours without chest pain, but has noticed some dyspnea. During this time she's also noticed gradual onset of worsening, constant weakness. He usually uses a walker at home and has been requiring increased assistance for ambulation and a wheelchair at times.  Discharge Diagnoses:  Active Problems:   Mitral regurgitation due to cusp prolapse   Hyperlipidemia   CIDP (chronic inflammatory demyelinating polyneuropathy) ([facility])   Long term current use of anticoagulant therapy   Atrial fibrillation with RVR ([facility])   Fever   Acute cystitis without hematuria   Sepsis ([place])   Influenza with pneumonia  SIRS Due to influenza and UTI - Tamiflu started, empirically. PCR + for influenza A - Blood cultures and urine cultures remain negative till date. - Patient improving but not at baseline. - Leucytosis resolved.  Atrial fibrillation with rapid ventricular response:  - INR therapeutic, continue coumadin - cardiac enzymes peaked at 2.15 and downtrending (elevation likely from demand ischemia 2/2 hypotension, sepsis and A.fib with RVR) likely due demand ischemia Increase metoprolol Twice a day.  Elevated troponins: - Mild elevation in cardiac biomarkers likely due to demand ischemia in the setting  of RVR answers. - 2-D echo was done that showed no motion abnormalities and EF of 50% with mild diffuse hypokinesia  Acute hypoxemic respiratory failure with hypoxia:  - Diffuse interstitial edema on CXR without infiltrate.  - Continue tamiflu - Oxine was DC'd and patient was able to maintain saturations above 88%.   Discharge Instructions  Discharge Instructions    Diet - low sodium heart healthy    Complete by:  As directed    Increase activity slowly    Complete by:  As directed      Allergies as of 04/12/2016      Reactions   Penicillins Other (See Comments)   Has patient had a PCN reaction causing immediate rash, facial/tongue/throat swelling, SOB or lightheadedness with hypotension: no Has patient had a PCN reaction causing severe rash involving mucus membranes or skin necrosis: no Has patient had a PCN reaction that required hospitalization no Has patient had a PCN reaction occurring within the last 10 years: no If all of the above answers are "NO", then may proceed with Cephalosporin use.      Medication List    TAKE these medications   cetirizine 10 MG tablet Commonly known as:  ZYRTEC Take 10 mg by mouth as needed.   metoprolol tartrate 25 MG tablet Commonly known as:  LOPRESSOR Take 1 tablet (25 mg total) by mouth 2 (two) times daily.   oseltamivir 30 MG capsule Commonly known as:  TAMIFLU Take 1 capsule (30 mg total) by mouth 2 (two) times daily.   warfarin 2 MG tablet Commonly known as:  COUMADIN Take 2 mg by mouth daily.       Allergies  Allergen  Reactions  . Penicillins Other (See Comments)    Has patient had a PCN reaction causing immediate rash, facial/tongue/throat swelling, SOB or lightheadedness with hypotension: no Has patient had a PCN reaction causing severe rash involving mucus membranes or skin necrosis: no Has patient had a PCN reaction that required hospitalization no Has patient had a PCN reaction occurring within the last 10 years:  no If all of the above answers are "NO", then may proceed with Cephalosporin use.     Consultations:  None   Procedures/Studies: Dg Chest 2 View  Result Date: 04/08/2016 CLINICAL DATA:  Productive cough and weakness. EXAM: CHEST  2 VIEW COMPARISON:  02/20/2015 FINDINGS: The cardiac silhouette is mildly enlarged. Mediastinal contours appear intact. Calcific atherosclerotic disease of the aorta. There is no evidence of pleural effusion or pneumothorax. Diffuse increase of the interstitial markings. Osseous structures are without acute abnormality. Soft tissues are grossly normal. IMPRESSION: Diffusely increased interstitial markings usually associated with interstitial pulmonary edema. Electronically Signed   By: Fidela Salisbury M.D.   On: 04/08/2016 07:45   Dg Foot 2 Views Left  Result Date: 04/08/2016 CLINICAL DATA:  Fall today with left foot pain, initial encounter EXAM: LEFT FOOT - 2 VIEW COMPARISON:  None. FINDINGS: No acute fracture or dislocation is noted. Mild tarsal degenerative changes are seen. The soft tissue abnormality is noted. IMPRESSION: No acute abnormality noted. Electronically Signed   By: Inez Catalina M.D.   On: 04/08/2016 18:47      Subjective:  Feels great greater bradycardia to go to skilled nursing facility. Discharge Exam: Vitals:   04/11/16 2015 04/12/16 0657  BP: (!) 141/85 128/86  Pulse: (!) 106 96  Resp: (!) 22 20  Temp: 98.6 F (37 C) 98.2 F (36.8 C)   Vitals:   04/11/16 0906 04/11/16 1351 04/11/16 2015 04/12/16 0657  BP: 127/76 132/78 (!) 141/85 128/86  Pulse: 95 94 (!) 106 96  Resp:  19 (!) 22 20  Temp:  97.9 F (36.6 C) 98.6 F (37 C) 98.2 F (36.8 C)  TempSrc:  Oral Oral Oral  SpO2:  97% 95% 91%  Weight:    69.9 kg (154 lb 1.6 oz)  Height:        General: Pt is alert, awake, not in acute distress Cardiovascular: RRR, S1/S2 +, no rubs, no gallops Respiratory: CTA bilaterally, no wheezing, no rhonchi Abdominal: Soft, NT, ND,  bowel sounds + Extremities: no edema, no cyanosis    The results of significant diagnostics from this hospitalization (including imaging, microbiology, ancillary and laboratory) are listed below for reference.     Microbiology: Recent Results (from the past 240 hour(s))  Blood Culture (routine x 2)     Status: None (Preliminary result)   Collection Time: 04/08/16  8:21 AM  Result Value Ref Range Status   Specimen Description BLOOD LEFT ARM  Final   Special Requests BOTTLES DRAWN AEROBIC AND ANAEROBIC 5 CC EACH  Final   Culture   Final    NO GROWTH 3 DAYS Performed at [facility], [place] [address] [place], [place] [address]    Report Status PENDING  Incomplete  Blood Culture (routine x 2)     Status: None (Preliminary result)   Collection Time: 04/08/16  9:04 AM  Result Value Ref Range Status   Specimen Description BLOOD BLOOD LEFT FOREARM  Final   Special Requests BOTTLES DRAWN AEROBIC ONLY 9 CC  Final   Culture   Final    NO  GROWTH 3 DAYS Performed at [facility], [place] [address], [place], [place] [address]    Report Status PENDING  Incomplete  Urine culture     Status: Abnormal   Collection Time: 04/08/16  9:44 AM  Result Value Ref Range Status   Specimen Description URINE, CATHETERIZED  Final   Special Requests NONE  Final   Culture MULTIPLE SPECIES PRESENT, SUGGEST RECOLLECTION (A)  Final   Report Status 04/09/2016 FINAL  Final  MRSA PCR Screening     Status: None   Collection Time: 04/08/16  4:25 PM  Result Value Ref Range Status   MRSA by PCR NEGATIVE NEGATIVE Final    Comment:        The GeneXpert MRSA Assay (FDA approved for NASAL specimens only), is one component of a comprehensive MRSA colonization surveillance program. It is not intended to diagnose MRSA infection nor to guide or monitor treatment for MRSA infections.   Urine culture     Status: None   Collection Time: 04/09/16 12:08 PM  Result Value Ref Range Status   Specimen Description  URINE, CLEAN CATCH  Final   Special Requests NONE  Final   Culture   Final    NO GROWTH Performed at [facility], [address] [place],  [address]    Report Status 04/10/2016 FINAL  Final     Labs: BNP (last 3 results) No results for input(s): BNP in the last 8760 hours. Basic Metabolic Panel:  Recent Labs Lab 04/08/16 0805 04/09/16 0209 04/12/16 0540  NA 139 141 141  K 3.8 3.5 3.1*  CL 105 110 107  CO2 24 24 25   GLUCOSE 145* 97 97  BUN 19 21* 14  CREATININE 0.84 0.90 0.70  CALCIUM 9.3 8.6* 8.9   Liver Function Tests:  Recent Labs Lab 04/08/16 0805  AST 47*  ALT 15*  ALKPHOS 59  BILITOT 2.0*  PROT 7.3  ALBUMIN 4.5    Recent Labs Lab 04/08/16 0805  LIPASE 26   No results for input(s): AMMONIA in the last 168 hours. CBC:  Recent Labs Lab 04/08/16 0805 04/09/16 0209 04/12/16 0540  WBC 14.6* 24.3* 6.3  NEUTROABS 10.5*  --   --   HGB 14.0 12.6* 12.1*  HCT 42.9 39.7 37.2*  MCV 94.7 95.4 93.5  PLT 169 148* 148*   Cardiac Enzymes:  Recent Labs Lab 04/08/16 1440 04/08/16 1940 04/09/16 0209 04/09/16 0733 04/09/16 1353  TROPONINI 1.57* 2.15* 1.64* 1.57* 1.37*   BNP: Invalid input(s): POCBNP CBG: No results for input(s): GLUCAP in the last 168 hours. D-Dimer No results for input(s): DDIMER in the last 72 hours. Hgb A1c No results for input(s): HGBA1C in the last 72 hours. Lipid Profile No results for input(s): CHOL, HDL, LDLCALC, TRIG, CHOLHDL, LDLDIRECT in the last 72 hours. Thyroid function studies No results for input(s): TSH, T4TOTAL, T3FREE, THYROIDAB in the last 72 hours.  Invalid input(s): FREET3 Anemia work up No results for input(s): VITAMINB12, FOLATE, FERRITIN, TIBC, IRON, RETICCTPCT in the last 72 hours. Urinalysis    Component Value Date/Time   COLORURINE RED (A) 04/08/2016 1016   APPEARANCEUR CLEAR 04/08/2016 1016   LABSPEC 1.025 04/08/2016 1016   PHURINE 5.0 04/08/2016 1016   GLUCOSEU 250 (A) 04/08/2016  1016   HGBUR LARGE (A) 04/08/2016 1016   BILIRUBINUR NEGATIVE 04/08/2016 1016   KETONESUR 40 (A) 04/08/2016 1016   PROTEINUR >300 (A) 04/08/2016 1016   UROBILINOGEN 0.2 08/09/2008 1949   NITRITE POSITIVE (  A) 04/08/2016 1016   LEUKOCYTESUR MODERATE (A) 04/08/2016 1016   Sepsis Labs Invalid input(s): PROCALCITONIN,  WBC,  LACTICIDVEN Microbiology Recent Results (from the past 240 hour(s))  Blood Culture (routine x 2)     Status: None (Preliminary result)   Collection Time: 04/08/16  8:21 AM  Result Value Ref Range Status   Specimen Description BLOOD LEFT ARM  Final   Special Requests BOTTLES DRAWN AEROBIC AND ANAEROBIC 5 CC EACH  Final   Culture   Final    NO GROWTH 3 DAYS Performed at [facility], [place] [address] [place], [place] [address]    Report Status PENDING  Incomplete  Blood Culture (routine x 2)     Status: None (Preliminary result)   Collection Time: 04/08/16  9:04 AM  Result Value Ref Range Status   Specimen Description BLOOD BLOOD LEFT FOREARM  Final   Special Requests BOTTLES DRAWN AEROBIC ONLY 9 CC  Final   Culture   Final    NO GROWTH 3 DAYS Performed at [facility], [place] [address], [place], [place] [address]    Report Status PENDING  Incomplete  Urine culture     Status: Abnormal   Collection Time: 04/08/16  9:44 AM  Result Value Ref Range Status   Specimen Description URINE, CATHETERIZED  Final   Special Requests NONE  Final   Culture MULTIPLE SPECIES PRESENT, SUGGEST RECOLLECTION (A)  Final   Report Status 04/09/2016 FINAL  Final  MRSA PCR Screening     Status: None   Collection Time: 04/08/16  4:25 PM  Result Value Ref Range Status   MRSA by PCR NEGATIVE NEGATIVE Final    Comment:        The GeneXpert MRSA Assay (FDA approved for NASAL specimens only), is one component of a comprehensive MRSA colonization surveillance program. It is not intended to diagnose MRSA infection nor to guide or monitor treatment for MRSA infections.    Urine culture     Status: None   Collection Time: 04/09/16 12:08 PM  Result Value Ref Range Status   Specimen Description URINE, CLEAN CATCH  Final   Special Requests NONE  Final   Culture   Final    NO GROWTH Performed at [facility], [address] [place], [place] [address]    Report Status 04/10/2016 FINAL  Final     Time coordinating discharge: Over 30 minutes  SIGNED:   Charlynne Cousins, MD  [facility] 04/12/2016, 11:37 AM Pager   If 7PM-7AM, please contact night-coverage [email or web address] Password [email or web address]

## 2016-04-12 NOTE — Progress Notes (Signed)
Physical Therapy Treatment Patient Details Name: Rodney Malone MRN: [id number] DOB: [birth year] Today's Date: 04/12/2016    History of Present Illness 83 Y.O. male admitted with fall, weakness. Dx of flu and PNA. PMH of chronic inflammatory demyelinating polyneuropathy, a fib.     PT Comments    Pt assisted with donning AFOs in bed.  Pt continues to require at least mod assist for transfers at this time.  Pt felt unable to ambulate today due to weakness; pt also reports not eating so encouraged to try to eat untouched lunch tray.     Follow Up Recommendations  SNF;Supervision/Assistance - 24 hour     Equipment Recommendations  None recommended by PT    Recommendations for Other Services       Precautions / Restrictions Precautions Precautions: Fall Precaution Comments: pt reports several falls in the past few years 2* dizziness Required Braces or Orthoses: Other Brace/Splint Other Brace/Splint: B AFOs    Mobility  Bed Mobility Overal bed mobility: Needs Assistance Bed Mobility: Supine to Sit     Supine to sit: Min assist     General bed mobility comments: increased time and effort, assist to raise trunk  Transfers Overall transfer level: Needs assistance Equipment used: Rolling walker (2 wheeled) Transfers: Sit to/from [facility] Sit to Stand: Mod assist Stand pivot transfers: Mod assist       General transfer comment: assist to rise and steady as well as control descent, verbal cues for safe technique, pt felt too weak to ambulate today however agreeable to take a few steps over to recliner  Ambulation/Gait                 Stairs            Wheelchair Mobility    Modified Rankin (Stroke Patients Only)       Balance Overall balance assessment: History of Falls;Needs assistance         Standing balance support: Bilateral upper extremity supported;During functional activity Standing balance-Leahy Scale: Zero Standing  balance comment: requires BUE support and external assist                    Cognition Arousal/Alertness: Awake/alert Behavior During Therapy: WFL for tasks assessed/performed Overall Cognitive Status: Within Functional Limits for tasks assessed                      Exercises      General Comments        Pertinent Vitals/Pain Pain Assessment: No/denies pain    Home Living                      Prior Function            PT Goals (current goals can now be found in the care plan section) Progress towards PT goals: Progressing toward goals    Frequency    Min 3X/week      PT Plan Current plan remains appropriate    Co-evaluation             End of Session Equipment Utilized During Treatment: Gait belt Activity Tolerance: Patient limited by fatigue Patient left: in chair;with call bell/phone within reach;with chair alarm set     Time: [id number] PT Time Calculation (min) (ACUTE ONLY): 23 min  Charges:  $Therapeutic Activity: 8-22 mins                    G  CodesTrena Platt Apr 29, 2016, 12:49 PM Carmelia Bake, PT, DPT 04/29/16 Pager: [phone]

## 2016-04-12 NOTE — Care Management Important Message (Signed)
Important Message  Patient Details IM Letter given to Cookie/Case Manager to present to Patient Name: Rodney Malone MRN: [id number] Date of Birth: Sep 22, 1922   Medicare Important Message Given:  Yes    Kerin Salen 04/12/2016, 10:41 AMImportant Message  Patient Details  Name: Rodney Malone MRN: [id number] Date of Birth: [birth year]   Medicare Important Message Given:  Yes    Kerin Salen 04/12/2016, 10:41 AM

## 2016-04-12 NOTE — Progress Notes (Signed)
Patient is set to discharge to [facility] today. Patient & grand-daughter made aware, voicemail left for patient's wife. Discharge packet given to RN, Stanton Kidney. [facility] called for transport.     Rodney Malone, [facility] Clinical Social Worker cell #: [phone]

## 2016-04-12 NOTE — Progress Notes (Signed)
[place] for warfarin Indication: atrial fibrillation  Allergies  Allergen Reactions  . Penicillins Other (See Comments)    Has patient had a PCN reaction causing immediate rash, facial/tongue/throat swelling, SOB or lightheadedness with hypotension: no Has patient had a PCN reaction causing severe rash involving mucus membranes or skin necrosis: no Has patient had a PCN reaction that required hospitalization no Has patient had a PCN reaction occurring within the last 10 years: no If all of the above answers are "NO", then may proceed with Cephalosporin use.     Patient Measurements: Height: 6' (182.9 cm) Weight: 154 lb 1.6 oz (69.9 kg) IBW/kg (Calculated) : 77.6  Vital Signs: Temp: 98.2 F (36.8 C) (02/01 0657) Temp Source: Oral (02/01 0657) BP: 128/86 (02/01 0657) Pulse Rate: 96 (02/01 0657)  Labs:  Recent Labs  04/09/16 1353 04/10/16 0354 04/11/16 0530 04/12/16 0540  HGB  --   --   --  12.1*  HCT  --   --   --  37.2*  PLT  --   --   --  148*  LABPROT  --  35.7* 37.1* 34.5*  INR  --  3.47 3.63 3.32  CREATININE  --   --   --  0.70  TROPONINI 1.37*  --   --   --     Estimated Creatinine Clearance: 59.5 mL/min (by C-G formula based on SCr of 0.7 mg/dL).  Assessment: 81 YOM presents with weakness and influenza.  History of afib on warfarin at home.  INR therapeutic at time of admission  Home dose: warfarin 2mg  daily (last dose 1/27)  Significant events: 1/29: Troponins elevated overnight; given Lovenox 1 mg/kg x 1 with concern for ACS/STEMI, but now suspect demand ischemia only. Lovenox not continued.  Today, 04/12/2016  INR 3.32, remains elevated but decreased.   CBC: Hgb and Plt both slightly low, but remain fairly stable.  Diet: Heart healthy; minimal intake  Drug-drug interactions:  recent dose Levaquin (1/28), now on Ceftriaxone.  Recent doses of Amiodarone IV bolus (1/28, 1/30) for Afib, currently on metoprolol  only.  Goal of Therapy:  INR 2-3 Monitor platelets by anticoagulation protocol: Yes   Plan:   Hold warfarin tonight  Daily INR; CBC at least q72 hrs while on warfarin inpatient  Monitor for signs of bleeding or thrombosis   Gretta Arab PharmD, BCPS Pager [phone] 04/12/2016 10:51 AM

## 2016-04-13 DIAGNOSIS — C7901 Secondary malignant neoplasm of right kidney and renal pelvis: Secondary | ICD-10-CM | POA: Diagnosis not present

## 2016-04-13 DIAGNOSIS — I4891 Unspecified atrial fibrillation: Secondary | ICD-10-CM | POA: Diagnosis not present

## 2016-04-13 LAB — CULTURE, BLOOD (ROUTINE X 2)
CULTURE: NO GROWTH
Culture: NO GROWTH

## 2016-04-16 DIAGNOSIS — I4891 Unspecified atrial fibrillation: Secondary | ICD-10-CM | POA: Diagnosis not present

## 2016-04-16 DIAGNOSIS — Z7901 Long term (current) use of anticoagulants: Secondary | ICD-10-CM | POA: Diagnosis not present

## 2016-04-17 ENCOUNTER — Encounter: Payer: Self-pay | Admitting: Internal Medicine

## 2016-04-17 ENCOUNTER — Non-Acute Institutional Stay (SKILLED_NURSING_FACILITY): Payer: Medicare Other | Admitting: Internal Medicine

## 2016-04-17 DIAGNOSIS — I4891 Unspecified atrial fibrillation: Secondary | ICD-10-CM | POA: Diagnosis not present

## 2016-04-17 DIAGNOSIS — I051 Rheumatic mitral insufficiency: Secondary | ICD-10-CM | POA: Diagnosis not present

## 2016-04-17 DIAGNOSIS — A419 Sepsis, unspecified organism: Secondary | ICD-10-CM

## 2016-04-17 DIAGNOSIS — J1100 Influenza due to unidentified influenza virus with unspecified type of pneumonia: Secondary | ICD-10-CM

## 2016-04-17 DIAGNOSIS — R413 Other amnesia: Secondary | ICD-10-CM | POA: Diagnosis not present

## 2016-04-17 DIAGNOSIS — Z7901 Long term (current) use of anticoagulants: Secondary | ICD-10-CM

## 2016-04-17 DIAGNOSIS — G6181 Chronic inflammatory demyelinating polyneuritis: Secondary | ICD-10-CM

## 2016-04-17 NOTE — Progress Notes (Signed)
Provider:  Rexene Edison. Mariea Clonts, D.O., C.M.D. Location:  Occupational psychologist of Service:  SNF (31)  PCP: [name], MD Patient Care Team: [name], MD as PCP - General (Internal Medicine)  Extended Emergency Contact Information Primary Emergency Contact: [facility] Address: [address]          [place], [place] [address] Johnnette Litter of [place] Phone: [phone] Mobile Phone: [phone] Relation: Spouse Secondary Emergency Contact: Crista Luria Address: [address] [place], [place] [address] Johnnette Litter of [place] Phone: [phone] Mobile Phone: [phone] Relation: Son  Code Status: full code Goals of Care: Advanced Directive information Advanced Directives 04/08/2016  Does Patient Have a Medical Advance Directive? No  Would patient like information on creating a medical advance directive? No - Patient declined   Chief Complaint  Patient presents with  . New Admit To SNF    influenza    HPI: Patient is a 83 y.o. male with h/o mitral regurgitation, mitral valve prolapse, afib on warfarin with goal INR 2-3, chronic inflammatory demyelinating polyneuropathy (wears braces for bilateral foot drops) seen today for admission to [facility] rehab s/p hospitalization with influenza A (PCR positive).    He had presented with chest pain, increased weakness and sob.  He met SIRS criteria at admission with respiratory failure with pulmonary edema on CXR and also had a urinary tract infection.  He was treated for sepsis (wbc 14.6, temp rectally 103.55F, lactate 3.25, trop 0.06, HR 169 with afib with RVR, hypoxic at 80%. He suffered some demand ischemia.  He is now on metoprolol bid instead of daily for his afib with RVR (initially given diltiazem drip).  He received bipap at hospital admission.  He was treated empirically for flu with tamiflu, given 2 liters of fluid, then they were held due to CXR findings.  He was  Also treated  empirically for bacterial pneumonia (vanc, levaquin, aztreonam). Flu swab was negative.  His daughter was in favor of DNR code status after meeting with Dr. Lake Bells, but was going to discuss with her brother and mother.  Troponin on 1/28 was 2.15.  One dose lovenox given despite therapeutic INR of 2.33.  Due to persistent RVR, amiodarone was given.  He was treated with rocephin for the UTI.  PCR came back positive for influenza A--treated with tamiflu through yesterday, 2/5. Other abx stopped except rocephin and zithromax.  Echo showed EF 50% and diffuse hypokinesis.  He was no longer requiring O2 to keep sats over 88% and he was discharged back to [facility] for [facility]hab.  When seen, he had no fever, a productive cough, poor po intake.  He admitted to still feeling weak.  He was interested in getting PT, OT to ensure he's safe b/c his wife is also frail with cognitive losses so they may not be safe otherwise.    Past Medical History:  Diagnosis Date  . Allergic rhinitis   . Atrial fibrillation ([place])   . CIDP (chronic inflammatory demyelinating polyneuropathy) ([place])   . Colonic polyp   . H/O: CVA (cerebrovascular accident)   . Hyperbilirubinemia   . Hyperlipidemia   . Long term (current) use of anticoagulants   . Mitral regurgitation   . Nephrolithiasis   . Nevus, atypical   . Osteopenia   . Recurrent boils   . Unsteady gait    Past Surgical History:  Procedure Laterality Date  . APPENDECTOMY  Social History   Social History  . Marital status: Married    Spouse name: N/A  . Number of children: 2  . Years of education: N/A   Occupational History  . broadcasting    Social History Main Topics  . Smoking status: Former Smoker    Quit date: 10/06/1973  . Smokeless tobacco: Never Used  . Alcohol use Yes     Comment: usually have a couple of drinks a day  . Drug use: No  . Sexual activity: Not Asked   Other Topics Concern  . None   Social History Narrative   Patient is  Married([name]). Retired. Lives in  single level home, Independent Living  section at [facility] since 2009.   Former smoker, minimal Alcohol history   Patient has Advanced planning documents: Living Will             reports that he quit smoking about 42 years ago. He has never used smokeless tobacco. He reports that he drinks alcohol. He reports that he does not use drugs.  Functional Status Survey:    No family history on file.  Health Maintenance  Topic Date Due  . PNA vac Low Risk Adult (2 of 2 - PCV13) 03/12/2014  . INFLUENZA VACCINE  10/11/2015  . TETANUS/TDAP  02/19/2025    Allergies  Allergen Reactions  . Penicillins Other (See Comments)    Has patient had a PCN reaction causing immediate rash, facial/tongue/throat swelling, SOB or lightheadedness with hypotension: no Has patient had a PCN reaction causing severe rash involving mucus membranes or skin necrosis: no Has patient had a PCN reaction that required hospitalization no Has patient had a PCN reaction occurring within the last 10 years: no If all of the above answers are "NO", then may proceed with Cephalosporin use.     Allergies as of 04/17/2016      Reactions   Penicillins Other (See Comments)   Has patient had a PCN reaction causing immediate rash, facial/tongue/throat swelling, SOB or lightheadedness with hypotension: no Has patient had a PCN reaction causing severe rash involving mucus membranes or skin necrosis: no Has patient had a PCN reaction that required hospitalization no Has patient had a PCN reaction occurring within the last 10 years: no If all of the above answers are "NO", then may proceed with Cephalosporin use.      Medication List       Accurate as of 04/17/16 11:59 PM. Always use your most recent med list.          cetirizine 10 MG tablet Commonly known as:  ZYRTEC Take 10 mg by mouth as needed.   metoprolol tartrate 25 MG tablet Commonly known as:   LOPRESSOR Take 1 tablet (25 mg total) by mouth 2 (two) times daily.   warfarin 2 MG tablet Commonly known as:  COUMADIN Take 2 mg by mouth daily.       Review of Systems  Constitutional: Positive for malaise/fatigue. Negative for chills and fever.  HENT: Positive for hearing loss.   Eyes: Negative for blurred vision.       Glasses  Respiratory: Positive for cough and sputum production. Negative for shortness of breath.   Cardiovascular: Negative for chest pain, palpitations and leg swelling.  Gastrointestinal: Negative for abdominal pain, blood in stool, constipation and melena.  Genitourinary: Negative for dysuria.  Musculoskeletal: Negative for falls.       CDIP  Skin: Negative for itching and rash.  Neurological: Positive for  tingling, sensory change and weakness. Negative for dizziness and loss of consciousness.       CDIP, uses braces bilateral feet, walker  Psychiatric/Behavioral: Negative for depression and memory loss. The patient is not nervous/anxious and does not have insomnia.     Vitals:   04/17/16 1600  BP: 128/64  Pulse: 69  Resp: 19  Temp: 98.4 F (36.9 C)  TempSrc: Oral  SpO2: 95%  Weight: 155 lb (70.3 kg)   Body mass index is 21.02 kg/m. Physical Exam  Constitutional: He is oriented to person, place, and time.  Thin white male resting in bed, nad  HENT:  Head: Normocephalic and atraumatic.  Right Ear: External ear normal.  Left Ear: External ear normal.  Nose: Nose normal.  Mouth/Throat: Oropharynx is clear and moist.  Eyes: Conjunctivae and EOM are normal. Pupils are equal, round, and reactive to light.  glasses  Neck: Normal range of motion. Neck supple. No JVD present.  Cardiovascular: Intact distal pulses.   irreg irreg  Pulmonary/Chest: Effort normal and breath sounds normal.  Few remaining rhonchi  Abdominal: Soft. Bowel sounds are normal. He exhibits no distension. There is no tenderness. There is no rebound and no guarding. No hernia.   Musculoskeletal: Normal range of motion.  Weakness of bilateral LEs, wears braces bilaterally for ambulation/foot drops  Lymphadenopathy:    He has cervical adenopathy.  Neurological: He is alert and oriented to person, place, and time. No cranial nerve deficit.  Skin: Skin is warm and dry. Capillary refill takes less than 2 seconds.  Psychiatric: He has a normal mood and affect.    Labs reviewed: Basic Metabolic Panel:  Recent Labs  04/08/16 0805 04/09/16 0209 04/12/16 0540  NA 139 141 141  K 3.8 3.5 3.1*  CL 105 110 107  CO2 _0 GLUCOSE 145* 97 97  BUN 19 21* 14  CREATININE 0.84 0.90 0.70  CALCIUM 9.3 8.6* 8.9   Liver Function Tests:  Recent Labs  04/08/16 0805  AST 47*  ALT 15*  ALKPHOS 59  BILITOT 2.0*  PROT 7.3  ALBUMIN 4.5    Recent Labs  04/08/16 0805  LIPASE 26   No results for input(s): AMMONIA in the last 8760 hours. CBC:  Recent Labs  04/08/16 0805 04/09/16 0209 04/12/16 0540  WBC 14.6* 24.3* 6.3  NEUTROABS 10.5*  --   --   HGB 14.0 12.6* 12.1*  HCT 42.9 39.7 37.2*  MCV 94.7 95.4 93.5  PLT 169 148* 148*   Cardiac Enzymes:  Recent Labs  04/09/16 0209 04/09/16 0733 04/09/16 1353  TROPONINI 1.64* 1.57* 1.37*   BNP: Invalid input(s): POCBNP No results found for: HGBA1C No results found for: TSH No results found for: VITAMINB12 No results found for: FOLATE No results found for: IRON, TIBC, FERRITIN  Imaging and Procedures obtained prior to SNF admission: Dg Chest 2 View  Result Date: 04/08/2016 CLINICAL DATA:  Productive cough and weakness. EXAM: CHEST  2 VIEW COMPARISON:  02/20/2015 FINDINGS: The cardiac silhouette is mildly enlarged. Mediastinal contours appear intact. Calcific atherosclerotic disease of the aorta. There is no evidence of pleural effusion or pneumothorax. Diffuse increase of the interstitial markings. Osseous structures are without acute abnormality. Soft tissues are grossly normal. IMPRESSION: Diffusely  increased interstitial markings usually associated with interstitial pulmonary edema. Electronically Signed   By: Fidela Salisbury M.D.   On: 04/08/2016 07:45   Dg Foot 2 Views Left  Result Date: 04/08/2016 CLINICAL DATA:  Fall today with  left foot pain, initial encounter EXAM: LEFT FOOT - 2 VIEW COMPARISON:  None. FINDINGS: No acute fracture or dislocation is noted. Mild tarsal degenerative changes are seen. The soft tissue abnormality is noted. IMPRESSION: No acute abnormality noted. Electronically Signed   By: Inez Catalina M.D.   On: 04/08/2016 18:47    Assessment/Plan 1. Influenza with pneumonia -completed tamiflu yesterday -intake still poor--encourage po fluids and food intake -cont incentive spirometer to prevent recurrence, bacterial infection -droplet isolation precautions d/cd today  2. Sepsis, due to unspecified organism ([place]) -resolved  3. Atrial fibrillation with RVR (HCC) -cont lopressor 63m po bid -rate control back in check, but required diltiazem and later amiodarone during acute illness  4. Rheumatic mitral regurgitation -chronic, stable, echo done with diffiuse hypokinesis and EF 50%  5. Memory loss -noted in record and visit, not on medication, lives with his wife in independent living--both have cognitive losses -will need more support at home--to get PT, OT involved  6. CIDP (chronic inflammatory demyelinating polyneuropathy) (HCC) -chronically walks with braces and walker -weaker than baseline after poor po intake and serious infection -PT, OT for safety and strengthening, assess home situation  7. Long term current use of anticoagulant therapy -cont coumadin 245mpo daily and monitor INR--will need a f/u appt for this when he gets discharged home (not ready yet)  Family/ staff Communication: discussed with his wife and rehab nurse  Labs/tests ordered:  PT, OT  Adlai Nieblas L. Earlin Sweeden, D.O. [facility] [address]  [place] [address] Phone (Mon-Fri 8am-5pm):  [phone] Call:  [phone] follow prompts after 5pm & weekends Office Phone:  [phone] Fax:  [phone]

## 2016-04-19 DIAGNOSIS — J09X1 Influenza due to identified novel influenza A virus with pneumonia: Secondary | ICD-10-CM | POA: Diagnosis not present

## 2016-04-19 DIAGNOSIS — M6389 Disorders of muscle in diseases classified elsewhere, multiple sites: Secondary | ICD-10-CM | POA: Diagnosis not present

## 2016-04-19 DIAGNOSIS — R278 Other lack of coordination: Secondary | ICD-10-CM | POA: Diagnosis not present

## 2016-04-19 DIAGNOSIS — A419 Sepsis, unspecified organism: Secondary | ICD-10-CM | POA: Diagnosis not present

## 2016-04-19 DIAGNOSIS — J1289 Other viral pneumonia: Secondary | ICD-10-CM | POA: Diagnosis not present

## 2016-04-19 DIAGNOSIS — Z9181 History of falling: Secondary | ICD-10-CM | POA: Diagnosis not present

## 2016-04-19 DIAGNOSIS — R2681 Unsteadiness on feet: Secondary | ICD-10-CM | POA: Diagnosis not present

## 2016-04-19 DIAGNOSIS — G6181 Chronic inflammatory demyelinating polyneuritis: Secondary | ICD-10-CM | POA: Diagnosis not present

## 2016-04-19 DIAGNOSIS — R2689 Other abnormalities of gait and mobility: Secondary | ICD-10-CM | POA: Diagnosis not present

## 2016-04-19 DIAGNOSIS — I482 Chronic atrial fibrillation: Secondary | ICD-10-CM | POA: Diagnosis not present

## 2016-04-19 DIAGNOSIS — R0902 Hypoxemia: Secondary | ICD-10-CM | POA: Diagnosis not present

## 2016-04-20 DIAGNOSIS — J09X1 Influenza due to identified novel influenza A virus with pneumonia: Secondary | ICD-10-CM | POA: Diagnosis not present

## 2016-04-20 DIAGNOSIS — G6181 Chronic inflammatory demyelinating polyneuritis: Secondary | ICD-10-CM | POA: Diagnosis not present

## 2016-04-20 DIAGNOSIS — R2689 Other abnormalities of gait and mobility: Secondary | ICD-10-CM | POA: Diagnosis not present

## 2016-04-20 DIAGNOSIS — I482 Chronic atrial fibrillation: Secondary | ICD-10-CM | POA: Diagnosis not present

## 2016-04-20 DIAGNOSIS — R278 Other lack of coordination: Secondary | ICD-10-CM | POA: Diagnosis not present

## 2016-04-20 DIAGNOSIS — M6389 Disorders of muscle in diseases classified elsewhere, multiple sites: Secondary | ICD-10-CM | POA: Diagnosis not present

## 2016-04-23 DIAGNOSIS — G6181 Chronic inflammatory demyelinating polyneuritis: Secondary | ICD-10-CM | POA: Diagnosis not present

## 2016-04-23 DIAGNOSIS — Z7901 Long term (current) use of anticoagulants: Secondary | ICD-10-CM | POA: Diagnosis not present

## 2016-04-23 DIAGNOSIS — M6389 Disorders of muscle in diseases classified elsewhere, multiple sites: Secondary | ICD-10-CM | POA: Diagnosis not present

## 2016-04-23 DIAGNOSIS — R278 Other lack of coordination: Secondary | ICD-10-CM | POA: Diagnosis not present

## 2016-04-23 DIAGNOSIS — I482 Chronic atrial fibrillation: Secondary | ICD-10-CM | POA: Diagnosis not present

## 2016-04-23 DIAGNOSIS — I4891 Unspecified atrial fibrillation: Secondary | ICD-10-CM | POA: Diagnosis not present

## 2016-04-23 DIAGNOSIS — R2689 Other abnormalities of gait and mobility: Secondary | ICD-10-CM | POA: Diagnosis not present

## 2016-04-23 DIAGNOSIS — J09X1 Influenza due to identified novel influenza A virus with pneumonia: Secondary | ICD-10-CM | POA: Diagnosis not present

## 2016-04-24 DIAGNOSIS — I482 Chronic atrial fibrillation: Secondary | ICD-10-CM | POA: Diagnosis not present

## 2016-04-24 DIAGNOSIS — J09X1 Influenza due to identified novel influenza A virus with pneumonia: Secondary | ICD-10-CM | POA: Diagnosis not present

## 2016-04-24 DIAGNOSIS — G6181 Chronic inflammatory demyelinating polyneuritis: Secondary | ICD-10-CM | POA: Diagnosis not present

## 2016-04-24 DIAGNOSIS — D72828 Other elevated white blood cell count: Secondary | ICD-10-CM | POA: Diagnosis not present

## 2016-04-24 DIAGNOSIS — R2689 Other abnormalities of gait and mobility: Secondary | ICD-10-CM | POA: Diagnosis not present

## 2016-04-24 DIAGNOSIS — M6389 Disorders of muscle in diseases classified elsewhere, multiple sites: Secondary | ICD-10-CM | POA: Diagnosis not present

## 2016-04-24 DIAGNOSIS — R278 Other lack of coordination: Secondary | ICD-10-CM | POA: Diagnosis not present

## 2016-04-25 DIAGNOSIS — I482 Chronic atrial fibrillation: Secondary | ICD-10-CM | POA: Diagnosis not present

## 2016-04-25 DIAGNOSIS — R2689 Other abnormalities of gait and mobility: Secondary | ICD-10-CM | POA: Diagnosis not present

## 2016-04-25 DIAGNOSIS — M6389 Disorders of muscle in diseases classified elsewhere, multiple sites: Secondary | ICD-10-CM | POA: Diagnosis not present

## 2016-04-25 DIAGNOSIS — R278 Other lack of coordination: Secondary | ICD-10-CM | POA: Diagnosis not present

## 2016-04-25 DIAGNOSIS — J09X1 Influenza due to identified novel influenza A virus with pneumonia: Secondary | ICD-10-CM | POA: Diagnosis not present

## 2016-04-25 DIAGNOSIS — G6181 Chronic inflammatory demyelinating polyneuritis: Secondary | ICD-10-CM | POA: Diagnosis not present

## 2016-04-26 DIAGNOSIS — R278 Other lack of coordination: Secondary | ICD-10-CM | POA: Diagnosis not present

## 2016-04-26 DIAGNOSIS — M6389 Disorders of muscle in diseases classified elsewhere, multiple sites: Secondary | ICD-10-CM | POA: Diagnosis not present

## 2016-04-26 DIAGNOSIS — R2689 Other abnormalities of gait and mobility: Secondary | ICD-10-CM | POA: Diagnosis not present

## 2016-04-26 DIAGNOSIS — G6181 Chronic inflammatory demyelinating polyneuritis: Secondary | ICD-10-CM | POA: Diagnosis not present

## 2016-04-26 DIAGNOSIS — J09X1 Influenza due to identified novel influenza A virus with pneumonia: Secondary | ICD-10-CM | POA: Diagnosis not present

## 2016-04-26 DIAGNOSIS — I482 Chronic atrial fibrillation: Secondary | ICD-10-CM | POA: Diagnosis not present

## 2016-04-27 DIAGNOSIS — I482 Chronic atrial fibrillation: Secondary | ICD-10-CM | POA: Diagnosis not present

## 2016-04-27 DIAGNOSIS — J09X1 Influenza due to identified novel influenza A virus with pneumonia: Secondary | ICD-10-CM | POA: Diagnosis not present

## 2016-04-27 DIAGNOSIS — R278 Other lack of coordination: Secondary | ICD-10-CM | POA: Diagnosis not present

## 2016-04-27 DIAGNOSIS — R2689 Other abnormalities of gait and mobility: Secondary | ICD-10-CM | POA: Diagnosis not present

## 2016-04-27 DIAGNOSIS — G6181 Chronic inflammatory demyelinating polyneuritis: Secondary | ICD-10-CM | POA: Diagnosis not present

## 2016-04-27 DIAGNOSIS — M6389 Disorders of muscle in diseases classified elsewhere, multiple sites: Secondary | ICD-10-CM | POA: Diagnosis not present

## 2016-04-30 DIAGNOSIS — I480 Paroxysmal atrial fibrillation: Secondary | ICD-10-CM | POA: Diagnosis not present

## 2016-04-30 DIAGNOSIS — J111 Influenza due to unidentified influenza virus with other respiratory manifestations: Secondary | ICD-10-CM | POA: Diagnosis not present

## 2016-04-30 DIAGNOSIS — Z7901 Long term (current) use of anticoagulants: Secondary | ICD-10-CM | POA: Diagnosis not present

## 2016-05-01 DIAGNOSIS — R2689 Other abnormalities of gait and mobility: Secondary | ICD-10-CM | POA: Diagnosis not present

## 2016-05-01 DIAGNOSIS — M6389 Disorders of muscle in diseases classified elsewhere, multiple sites: Secondary | ICD-10-CM | POA: Diagnosis not present

## 2016-05-01 DIAGNOSIS — R278 Other lack of coordination: Secondary | ICD-10-CM | POA: Diagnosis not present

## 2016-05-01 DIAGNOSIS — G6181 Chronic inflammatory demyelinating polyneuritis: Secondary | ICD-10-CM | POA: Diagnosis not present

## 2016-05-01 DIAGNOSIS — I482 Chronic atrial fibrillation: Secondary | ICD-10-CM | POA: Diagnosis not present

## 2016-05-01 DIAGNOSIS — J09X1 Influenza due to identified novel influenza A virus with pneumonia: Secondary | ICD-10-CM | POA: Diagnosis not present

## 2016-05-02 DIAGNOSIS — M6389 Disorders of muscle in diseases classified elsewhere, multiple sites: Secondary | ICD-10-CM | POA: Diagnosis not present

## 2016-05-02 DIAGNOSIS — G6181 Chronic inflammatory demyelinating polyneuritis: Secondary | ICD-10-CM | POA: Diagnosis not present

## 2016-05-02 DIAGNOSIS — R2689 Other abnormalities of gait and mobility: Secondary | ICD-10-CM | POA: Diagnosis not present

## 2016-05-02 DIAGNOSIS — R278 Other lack of coordination: Secondary | ICD-10-CM | POA: Diagnosis not present

## 2016-05-02 DIAGNOSIS — J09X1 Influenza due to identified novel influenza A virus with pneumonia: Secondary | ICD-10-CM | POA: Diagnosis not present

## 2016-05-02 DIAGNOSIS — I482 Chronic atrial fibrillation: Secondary | ICD-10-CM | POA: Diagnosis not present

## 2016-05-03 DIAGNOSIS — G6181 Chronic inflammatory demyelinating polyneuritis: Secondary | ICD-10-CM | POA: Diagnosis not present

## 2016-05-03 DIAGNOSIS — I482 Chronic atrial fibrillation: Secondary | ICD-10-CM | POA: Diagnosis not present

## 2016-05-03 DIAGNOSIS — R278 Other lack of coordination: Secondary | ICD-10-CM | POA: Diagnosis not present

## 2016-05-03 DIAGNOSIS — J09X1 Influenza due to identified novel influenza A virus with pneumonia: Secondary | ICD-10-CM | POA: Diagnosis not present

## 2016-05-03 DIAGNOSIS — R2689 Other abnormalities of gait and mobility: Secondary | ICD-10-CM | POA: Diagnosis not present

## 2016-05-03 DIAGNOSIS — M6389 Disorders of muscle in diseases classified elsewhere, multiple sites: Secondary | ICD-10-CM | POA: Diagnosis not present

## 2016-05-04 DIAGNOSIS — R2689 Other abnormalities of gait and mobility: Secondary | ICD-10-CM | POA: Diagnosis not present

## 2016-05-04 DIAGNOSIS — J09X1 Influenza due to identified novel influenza A virus with pneumonia: Secondary | ICD-10-CM | POA: Diagnosis not present

## 2016-05-04 DIAGNOSIS — G6181 Chronic inflammatory demyelinating polyneuritis: Secondary | ICD-10-CM | POA: Diagnosis not present

## 2016-05-04 DIAGNOSIS — M6389 Disorders of muscle in diseases classified elsewhere, multiple sites: Secondary | ICD-10-CM | POA: Diagnosis not present

## 2016-05-04 DIAGNOSIS — R278 Other lack of coordination: Secondary | ICD-10-CM | POA: Diagnosis not present

## 2016-05-04 DIAGNOSIS — I482 Chronic atrial fibrillation: Secondary | ICD-10-CM | POA: Diagnosis not present

## 2016-05-07 DIAGNOSIS — M6389 Disorders of muscle in diseases classified elsewhere, multiple sites: Secondary | ICD-10-CM | POA: Diagnosis not present

## 2016-05-07 DIAGNOSIS — G6181 Chronic inflammatory demyelinating polyneuritis: Secondary | ICD-10-CM | POA: Diagnosis not present

## 2016-05-07 DIAGNOSIS — R2689 Other abnormalities of gait and mobility: Secondary | ICD-10-CM | POA: Diagnosis not present

## 2016-05-07 DIAGNOSIS — J09X1 Influenza due to identified novel influenza A virus with pneumonia: Secondary | ICD-10-CM | POA: Diagnosis not present

## 2016-05-07 DIAGNOSIS — I482 Chronic atrial fibrillation: Secondary | ICD-10-CM | POA: Diagnosis not present

## 2016-05-07 DIAGNOSIS — R278 Other lack of coordination: Secondary | ICD-10-CM | POA: Diagnosis not present

## 2016-05-08 DIAGNOSIS — G6181 Chronic inflammatory demyelinating polyneuritis: Secondary | ICD-10-CM | POA: Diagnosis not present

## 2016-05-08 DIAGNOSIS — J09X1 Influenza due to identified novel influenza A virus with pneumonia: Secondary | ICD-10-CM | POA: Diagnosis not present

## 2016-05-08 DIAGNOSIS — I480 Paroxysmal atrial fibrillation: Secondary | ICD-10-CM | POA: Diagnosis not present

## 2016-05-08 DIAGNOSIS — I482 Chronic atrial fibrillation: Secondary | ICD-10-CM | POA: Diagnosis not present

## 2016-05-08 DIAGNOSIS — R278 Other lack of coordination: Secondary | ICD-10-CM | POA: Diagnosis not present

## 2016-05-08 DIAGNOSIS — M6389 Disorders of muscle in diseases classified elsewhere, multiple sites: Secondary | ICD-10-CM | POA: Diagnosis not present

## 2016-05-08 DIAGNOSIS — Z7901 Long term (current) use of anticoagulants: Secondary | ICD-10-CM | POA: Diagnosis not present

## 2016-05-08 DIAGNOSIS — R2689 Other abnormalities of gait and mobility: Secondary | ICD-10-CM | POA: Diagnosis not present

## 2016-05-09 DIAGNOSIS — J09X1 Influenza due to identified novel influenza A virus with pneumonia: Secondary | ICD-10-CM | POA: Diagnosis not present

## 2016-05-09 DIAGNOSIS — I482 Chronic atrial fibrillation: Secondary | ICD-10-CM | POA: Diagnosis not present

## 2016-05-09 DIAGNOSIS — M6389 Disorders of muscle in diseases classified elsewhere, multiple sites: Secondary | ICD-10-CM | POA: Diagnosis not present

## 2016-05-09 DIAGNOSIS — R278 Other lack of coordination: Secondary | ICD-10-CM | POA: Diagnosis not present

## 2016-05-09 DIAGNOSIS — G6181 Chronic inflammatory demyelinating polyneuritis: Secondary | ICD-10-CM | POA: Diagnosis not present

## 2016-05-09 DIAGNOSIS — R2689 Other abnormalities of gait and mobility: Secondary | ICD-10-CM | POA: Diagnosis not present

## 2016-05-10 DIAGNOSIS — J09X1 Influenza due to identified novel influenza A virus with pneumonia: Secondary | ICD-10-CM | POA: Diagnosis not present

## 2016-05-10 DIAGNOSIS — R2681 Unsteadiness on feet: Secondary | ICD-10-CM | POA: Diagnosis not present

## 2016-05-10 DIAGNOSIS — M6389 Disorders of muscle in diseases classified elsewhere, multiple sites: Secondary | ICD-10-CM | POA: Diagnosis not present

## 2016-05-10 DIAGNOSIS — J1289 Other viral pneumonia: Secondary | ICD-10-CM | POA: Diagnosis not present

## 2016-05-10 DIAGNOSIS — R278 Other lack of coordination: Secondary | ICD-10-CM | POA: Diagnosis not present

## 2016-05-10 DIAGNOSIS — G6181 Chronic inflammatory demyelinating polyneuritis: Secondary | ICD-10-CM | POA: Diagnosis not present

## 2016-05-10 DIAGNOSIS — R2689 Other abnormalities of gait and mobility: Secondary | ICD-10-CM | POA: Diagnosis not present

## 2016-05-10 DIAGNOSIS — I482 Chronic atrial fibrillation: Secondary | ICD-10-CM | POA: Diagnosis not present

## 2016-05-10 DIAGNOSIS — R0902 Hypoxemia: Secondary | ICD-10-CM | POA: Diagnosis not present

## 2016-05-10 DIAGNOSIS — Z9181 History of falling: Secondary | ICD-10-CM | POA: Diagnosis not present

## 2016-05-11 DIAGNOSIS — R278 Other lack of coordination: Secondary | ICD-10-CM | POA: Diagnosis not present

## 2016-05-11 DIAGNOSIS — M6389 Disorders of muscle in diseases classified elsewhere, multiple sites: Secondary | ICD-10-CM | POA: Diagnosis not present

## 2016-05-11 DIAGNOSIS — I482 Chronic atrial fibrillation: Secondary | ICD-10-CM | POA: Diagnosis not present

## 2016-05-11 DIAGNOSIS — G6181 Chronic inflammatory demyelinating polyneuritis: Secondary | ICD-10-CM | POA: Diagnosis not present

## 2016-05-11 DIAGNOSIS — R2689 Other abnormalities of gait and mobility: Secondary | ICD-10-CM | POA: Diagnosis not present

## 2016-05-11 DIAGNOSIS — J09X1 Influenza due to identified novel influenza A virus with pneumonia: Secondary | ICD-10-CM | POA: Diagnosis not present

## 2016-05-14 DIAGNOSIS — R278 Other lack of coordination: Secondary | ICD-10-CM | POA: Diagnosis not present

## 2016-05-14 DIAGNOSIS — G6181 Chronic inflammatory demyelinating polyneuritis: Secondary | ICD-10-CM | POA: Diagnosis not present

## 2016-05-14 DIAGNOSIS — M6389 Disorders of muscle in diseases classified elsewhere, multiple sites: Secondary | ICD-10-CM | POA: Diagnosis not present

## 2016-05-14 DIAGNOSIS — I482 Chronic atrial fibrillation: Secondary | ICD-10-CM | POA: Diagnosis not present

## 2016-05-14 DIAGNOSIS — J09X1 Influenza due to identified novel influenza A virus with pneumonia: Secondary | ICD-10-CM | POA: Diagnosis not present

## 2016-05-14 DIAGNOSIS — R2689 Other abnormalities of gait and mobility: Secondary | ICD-10-CM | POA: Diagnosis not present

## 2016-05-15 DIAGNOSIS — R2689 Other abnormalities of gait and mobility: Secondary | ICD-10-CM | POA: Diagnosis not present

## 2016-05-15 DIAGNOSIS — I482 Chronic atrial fibrillation: Secondary | ICD-10-CM | POA: Diagnosis not present

## 2016-05-15 DIAGNOSIS — G6181 Chronic inflammatory demyelinating polyneuritis: Secondary | ICD-10-CM | POA: Diagnosis not present

## 2016-05-15 DIAGNOSIS — M6389 Disorders of muscle in diseases classified elsewhere, multiple sites: Secondary | ICD-10-CM | POA: Diagnosis not present

## 2016-05-15 DIAGNOSIS — J09X1 Influenza due to identified novel influenza A virus with pneumonia: Secondary | ICD-10-CM | POA: Diagnosis not present

## 2016-05-15 DIAGNOSIS — R278 Other lack of coordination: Secondary | ICD-10-CM | POA: Diagnosis not present

## 2016-05-16 DIAGNOSIS — G6181 Chronic inflammatory demyelinating polyneuritis: Secondary | ICD-10-CM | POA: Diagnosis not present

## 2016-05-16 DIAGNOSIS — R278 Other lack of coordination: Secondary | ICD-10-CM | POA: Diagnosis not present

## 2016-05-16 DIAGNOSIS — M6389 Disorders of muscle in diseases classified elsewhere, multiple sites: Secondary | ICD-10-CM | POA: Diagnosis not present

## 2016-05-16 DIAGNOSIS — I482 Chronic atrial fibrillation: Secondary | ICD-10-CM | POA: Diagnosis not present

## 2016-05-16 DIAGNOSIS — J09X1 Influenza due to identified novel influenza A virus with pneumonia: Secondary | ICD-10-CM | POA: Diagnosis not present

## 2016-05-16 DIAGNOSIS — R2689 Other abnormalities of gait and mobility: Secondary | ICD-10-CM | POA: Diagnosis not present

## 2016-05-17 DIAGNOSIS — R278 Other lack of coordination: Secondary | ICD-10-CM | POA: Diagnosis not present

## 2016-05-17 DIAGNOSIS — M6389 Disorders of muscle in diseases classified elsewhere, multiple sites: Secondary | ICD-10-CM | POA: Diagnosis not present

## 2016-05-17 DIAGNOSIS — R2689 Other abnormalities of gait and mobility: Secondary | ICD-10-CM | POA: Diagnosis not present

## 2016-05-17 DIAGNOSIS — J09X1 Influenza due to identified novel influenza A virus with pneumonia: Secondary | ICD-10-CM | POA: Diagnosis not present

## 2016-05-17 DIAGNOSIS — G6181 Chronic inflammatory demyelinating polyneuritis: Secondary | ICD-10-CM | POA: Diagnosis not present

## 2016-05-17 DIAGNOSIS — I482 Chronic atrial fibrillation: Secondary | ICD-10-CM | POA: Diagnosis not present

## 2016-05-21 DIAGNOSIS — J111 Influenza due to unidentified influenza virus with other respiratory manifestations: Secondary | ICD-10-CM | POA: Diagnosis not present

## 2016-05-21 DIAGNOSIS — Z6821 Body mass index (BMI) 21.0-21.9, adult: Secondary | ICD-10-CM | POA: Diagnosis not present

## 2016-05-21 DIAGNOSIS — I340 Nonrheumatic mitral (valve) insufficiency: Secondary | ICD-10-CM | POA: Diagnosis not present

## 2016-05-21 DIAGNOSIS — G6181 Chronic inflammatory demyelinating polyneuritis: Secondary | ICD-10-CM | POA: Diagnosis not present

## 2016-05-21 DIAGNOSIS — I480 Paroxysmal atrial fibrillation: Secondary | ICD-10-CM | POA: Diagnosis not present

## 2016-05-21 DIAGNOSIS — Z7901 Long term (current) use of anticoagulants: Secondary | ICD-10-CM | POA: Diagnosis not present

## 2016-05-28 DIAGNOSIS — I480 Paroxysmal atrial fibrillation: Secondary | ICD-10-CM | POA: Diagnosis not present

## 2016-05-28 DIAGNOSIS — Z7901 Long term (current) use of anticoagulants: Secondary | ICD-10-CM | POA: Diagnosis not present

## 2016-05-30 ENCOUNTER — Ambulatory Visit: Payer: Medicare Other | Admitting: Podiatry

## 2016-05-31 DIAGNOSIS — H353132 Nonexudative age-related macular degeneration, bilateral, intermediate dry stage: Secondary | ICD-10-CM | POA: Diagnosis not present

## 2016-05-31 DIAGNOSIS — H52203 Unspecified astigmatism, bilateral: Secondary | ICD-10-CM | POA: Diagnosis not present

## 2016-05-31 DIAGNOSIS — H43813 Vitreous degeneration, bilateral: Secondary | ICD-10-CM | POA: Diagnosis not present

## 2016-05-31 DIAGNOSIS — Z961 Presence of intraocular lens: Secondary | ICD-10-CM | POA: Diagnosis not present

## 2016-06-21 ENCOUNTER — Encounter (HOSPITAL_COMMUNITY): Payer: Self-pay | Admitting: Emergency Medicine

## 2016-06-21 ENCOUNTER — Emergency Department (HOSPITAL_COMMUNITY): Payer: Medicare Other

## 2016-06-21 ENCOUNTER — Emergency Department (HOSPITAL_COMMUNITY)
Admission: EM | Admit: 2016-06-21 | Discharge: 2016-06-21 | Disposition: A | Discharge status: Home / Self Care | Payer: Medicare Other | Attending: Emergency Medicine | Admitting: Emergency Medicine

## 2016-06-21 DIAGNOSIS — S7002XA Contusion of left hip, initial encounter: Secondary | ICD-10-CM | POA: Insufficient documentation

## 2016-06-21 DIAGNOSIS — W010XXA Fall on same level from slipping, tripping and stumbling without subsequent striking against object, initial encounter: Secondary | ICD-10-CM | POA: Insufficient documentation

## 2016-06-21 DIAGNOSIS — Y939 Activity, unspecified: Secondary | ICD-10-CM | POA: Insufficient documentation

## 2016-06-21 DIAGNOSIS — Y999 Unspecified external cause status: Secondary | ICD-10-CM | POA: Insufficient documentation

## 2016-06-21 DIAGNOSIS — Z87891 Personal history of nicotine dependence: Secondary | ICD-10-CM | POA: Insufficient documentation

## 2016-06-21 DIAGNOSIS — T148XXA Other injury of unspecified body region, initial encounter: Secondary | ICD-10-CM | POA: Diagnosis not present

## 2016-06-21 DIAGNOSIS — S79912A Unspecified injury of left hip, initial encounter: Secondary | ICD-10-CM | POA: Diagnosis not present

## 2016-06-21 DIAGNOSIS — Y92009 Unspecified place in unspecified non-institutional (private) residence as the place of occurrence of the external cause: Secondary | ICD-10-CM | POA: Diagnosis not present

## 2016-06-21 DIAGNOSIS — Z7901 Long term (current) use of anticoagulants: Secondary | ICD-10-CM | POA: Insufficient documentation

## 2016-06-21 DIAGNOSIS — M25552 Pain in left hip: Secondary | ICD-10-CM | POA: Diagnosis not present

## 2016-06-21 DIAGNOSIS — Z8673 Personal history of transient ischemic attack (TIA), and cerebral infarction without residual deficits: Secondary | ICD-10-CM | POA: Insufficient documentation

## 2016-06-21 DIAGNOSIS — W19XXXA Unspecified fall, initial encounter: Secondary | ICD-10-CM

## 2016-06-21 NOTE — ED Notes (Signed)
Pt walked without difficulty  

## 2016-06-21 NOTE — ED Provider Notes (Signed)
[facility] DEPT Provider Note   CSN: [id number] Arrival date & time: 06/21/16  1636     History   Chief Complaint Chief Complaint  Patient presents with  . Hip Pain    left     HPI Rodney Malone is a 83 y.o. male.  The history is provided by the patient.  Hip Pain  This is a new problem. The current episode started 1 to 2 hours ago. The problem occurs constantly. The problem has not changed since onset.Pertinent negatives include no chest pain, no abdominal pain, no headaches and no shortness of breath. Nothing aggravates the symptoms. Nothing relieves the symptoms. He has tried nothing for the symptoms.    Past Medical History:  Diagnosis Date  . Allergic rhinitis   . Atrial fibrillation ([place])   . CIDP (chronic inflammatory demyelinating polyneuropathy) ([place])   . Colonic polyp   . H/O: CVA (cerebrovascular accident)   . Hyperbilirubinemia   . Hyperlipidemia   . Long term (current) use of anticoagulants   . Mitral regurgitation   . Nephrolithiasis   . Nevus, atypical   . Osteopenia   . Recurrent boils   . Unsteady gait     Patient Active Problem List   Diagnosis Date Noted  . Influenza with pneumonia   . Memory loss 04/05/2015  . Mild closed head injury 07/15/2013  . CIDP (chronic inflammatory demyelinating polyneuropathy) ([place])   . Unsteady gait   . Long term current use of anticoagulant therapy   . Mitral regurgitation due to cusp prolapse   . Atrial fibrillation ([place])   . Hyperlipidemia     Past Surgical History:  Procedure Laterality Date  . APPENDECTOMY         Home Medications    Prior to Admission medications   Medication Sig Start Date End Date Taking? Authorizing Provider  cetirizine (ZYRTEC) 10 MG tablet Take 10 mg by mouth as needed.     Historical Provider, MD  metoprolol tartrate (LOPRESSOR) 25 MG tablet Take 1 tablet (25 mg total) by mouth 2 (two) times daily. 04/12/16   Charlynne Cousins, MD  warfarin (COUMADIN) 2 MG tablet Take  2 mg by mouth daily.     Historical Provider, MD    Family History No family history on file.  Social History Social History  Substance Use Topics  . Smoking status: Former Smoker    Quit date: 10/06/1973  . Smokeless tobacco: Never Used  . Alcohol use Yes     Comment: usually have a couple of drinks a day     Allergies   Penicillins   Review of Systems Review of Systems  Respiratory: Negative for shortness of breath.   Cardiovascular: Negative for chest pain.  Gastrointestinal: Negative for abdominal pain.  Neurological: Negative for headaches.  All other systems reviewed and are negative.    Physical Exam Updated Vital Signs BP 126/72 (BP Location: Right Arm)   Pulse 70   Temp 98.1 F (36.7 C) (Oral)   Resp 18   SpO2 96%   Physical Exam  Constitutional: He is oriented to person, place, and time. He appears well-developed and well-nourished. No distress.  HENT:  Head: Normocephalic and atraumatic.  Right Ear: External ear normal.  Left Ear: External ear normal.  Nose: Nose normal.  Mouth/Throat: Oropharynx is clear and moist.  Eyes: Conjunctivae and EOM are normal.  Neck: Neck supple. No tracheal deviation present.  Cardiovascular: Normal rate, regular rhythm and normal heart sounds.   Pulmonary/Chest:  Effort normal and breath sounds normal. No respiratory distress. He exhibits no tenderness.  Abdominal: Soft. He exhibits no distension. There is no tenderness.  Musculoskeletal: Normal range of motion. He exhibits tenderness (left lateral posterior hip). He exhibits no deformity.  Neurological: He is alert and oriented to person, place, and time.  Skin: Skin is warm and dry. Capillary refill takes less than 2 seconds.  Psychiatric: He has a normal mood and affect.     ED Treatments / Results  Labs (all labs ordered are listed, but only abnormal results are displayed) Labs Reviewed - No data to display  EKG  EKG Interpretation None        Radiology No results found.  Procedures Procedures (including critical care time)  Medications Ordered in ED Medications - No data to display   Initial Impression / Assessment and Plan / ED Course  I have reviewed the triage vital signs and the nursing notes.  Pertinent labs & imaging results that were available during my care of the patient were reviewed by me and considered in my medical decision making (see chart for details).     83 year old male presents after fall from standing due to unsteady gait which is a chronic problem for him. He landed on his left lateral hip. He was unable to get himself up out of the floor and when his facility came to check on him by the request of his wife he was transported here for evaluation. He is on Coumadin but does not appear to have any injury to his head or other vital areas. Plain films of his pelvis and left hip show no acute fracture. He was ambulated in the emergency department with a walker device which he normally uses at home without difficulty. Plan to follow up with PCP as needed and return precautions discussed for worsening or new concerning symptoms.   Final Clinical Impressions(s) / ED Diagnoses   Final diagnoses:  Fall from standing, initial encounter  Contusion of left hip, initial encounter    New Prescriptions New Prescriptions   No medications on file     Leo Grosser, MD 06/22/16 [phone]

## 2016-06-21 NOTE — ED Notes (Signed)
Bed: [facility] Expected date:  Expected time:  Means of arrival:  Comments: 83 yo trip fall

## 2016-06-21 NOTE — ED Notes (Signed)
Visitor wants to be notified once results are back [place]: [phone] Says she may come back and get him.

## 2016-06-21 NOTE — ED Triage Notes (Signed)
Per [facility] pt from home , tripped af fell 34min ago in the drive way, hit the concrete.  Left hip pain and right wrist pain. No loc nor head injury. ambulates with walker assistance. No shortening nor rotation of the left leg. Was able to satnd up yet painful per ems.

## 2016-06-27 DIAGNOSIS — I4891 Unspecified atrial fibrillation: Secondary | ICD-10-CM | POA: Diagnosis not present

## 2016-06-27 DIAGNOSIS — I480 Paroxysmal atrial fibrillation: Secondary | ICD-10-CM | POA: Diagnosis not present

## 2016-06-27 DIAGNOSIS — I482 Chronic atrial fibrillation: Secondary | ICD-10-CM | POA: Diagnosis not present

## 2016-06-27 DIAGNOSIS — Z7901 Long term (current) use of anticoagulants: Secondary | ICD-10-CM | POA: Diagnosis not present

## 2016-06-27 DIAGNOSIS — Z6821 Body mass index (BMI) 21.0-21.9, adult: Secondary | ICD-10-CM | POA: Diagnosis not present

## 2016-06-27 DIAGNOSIS — R269 Unspecified abnormalities of gait and mobility: Secondary | ICD-10-CM | POA: Diagnosis not present

## 2016-07-12 DIAGNOSIS — I4891 Unspecified atrial fibrillation: Secondary | ICD-10-CM | POA: Diagnosis not present

## 2016-07-12 DIAGNOSIS — I482 Chronic atrial fibrillation: Secondary | ICD-10-CM | POA: Diagnosis not present

## 2016-07-18 ENCOUNTER — Ambulatory Visit (INDEPENDENT_AMBULATORY_CARE_PROVIDER_SITE_OTHER): Payer: Medicare Other | Admitting: Podiatry

## 2016-07-18 ENCOUNTER — Encounter: Payer: Self-pay | Admitting: Podiatry

## 2016-07-18 DIAGNOSIS — Z7901 Long term (current) use of anticoagulants: Secondary | ICD-10-CM | POA: Diagnosis not present

## 2016-07-18 DIAGNOSIS — B351 Tinea unguium: Secondary | ICD-10-CM

## 2016-07-18 DIAGNOSIS — M79676 Pain in unspecified toe(s): Secondary | ICD-10-CM

## 2016-07-18 DIAGNOSIS — I4891 Unspecified atrial fibrillation: Secondary | ICD-10-CM | POA: Diagnosis not present

## 2016-07-18 NOTE — Progress Notes (Signed)
Patient ID: Rodney Malone, male   DOB: September 23, 1922, 83 y.o.   MRN: [id number]    Subjective: This patient presents again complaining of thick, elongated toenails with direct shoe pressure and walking and is requesting toenail debridement  Objective: Orientated 3 Bilateral AFOs Bilateral peripheral pitting edema DP and PT pulses 2/4 bilaterally Capillary Reflex immediate bilaterally General rubor bilaterally Dorsi flexion and plantar flexion 0/4 bilaterally Ankle reflexes weakly reactive bilaterally Unsteady gait pattern walking with walker Hammertoe 2-4 left No open skin lesions bilaterally Atrophic skin bilaterally The toenails are elongated, brittle, discolored, hypertrophic and tender direct palpation 6-10  Assessment: Symptomatic onychomycoses 6-10 History of chronic inflammatory demyelinating polyneuropathy  Plan: Debridement toenails 10 mechanically and electrically with slight bleeding distal fifth right toe treated with topical antibiotic ointment and Band-Aid. Patient instructed removed Band-Aid 1-3 days and continue to apply topical antibiotic ointment and Band-Aid until a scab forms  Reappoint 3 months

## 2016-07-18 NOTE — Patient Instructions (Signed)
Removed Band-Aid on the fifth right toe and 1-3 days and continue to apply topical antibiotic ointment such as triple antibiotic ointment and a Band-Aid daily until a scab forms

## 2016-07-26 DIAGNOSIS — I4891 Unspecified atrial fibrillation: Secondary | ICD-10-CM | POA: Diagnosis not present

## 2016-07-26 DIAGNOSIS — R05 Cough: Secondary | ICD-10-CM | POA: Diagnosis not present

## 2016-07-26 DIAGNOSIS — Z7901 Long term (current) use of anticoagulants: Secondary | ICD-10-CM | POA: Diagnosis not present

## 2016-07-26 DIAGNOSIS — R2689 Other abnormalities of gait and mobility: Secondary | ICD-10-CM | POA: Diagnosis not present

## 2016-07-26 DIAGNOSIS — R0600 Dyspnea, unspecified: Secondary | ICD-10-CM | POA: Diagnosis not present

## 2016-07-26 DIAGNOSIS — I480 Paroxysmal atrial fibrillation: Secondary | ICD-10-CM | POA: Diagnosis not present

## 2016-07-26 DIAGNOSIS — I340 Nonrheumatic mitral (valve) insufficiency: Secondary | ICD-10-CM | POA: Diagnosis not present

## 2016-07-26 DIAGNOSIS — I5031 Acute diastolic (congestive) heart failure: Secondary | ICD-10-CM | POA: Diagnosis not present

## 2016-07-26 DIAGNOSIS — Z6822 Body mass index (BMI) 22.0-22.9, adult: Secondary | ICD-10-CM | POA: Diagnosis not present

## 2016-07-28 NOTE — Progress Notes (Deleted)
Rodney Malone Date of Birth: November 03, 1922 Medical Record #[id number]  History of Present Illness: Mr. Sampedro is seen for evaluation of increased dyspnea at the request of Dr. Brigitte Pulse. He has a history of mitral insufficiency and atrial fibrillation. 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 He has a history of chronic atrial fibrillation and has been on Coumadin. He has a remote history of stroke. He is limited predominantly by his Neurologic issues. His walking is very limited. He uses a walker. He lives at [facility]. He has a history of recurrent falls.  He was admitted in February with SIRS due to influenza and UTI. Troponin peaked at 2.2. This was felt to be related to demand ischemia. Echo showed EF 50%. There was MVP with suspected flail posterior leaflet. Poor study- was unable to adequately assess MR. CXR showed diffusely increased pulmonary markings c/w pulmonary edema. He was treated with Tamiflu. He has Afib with RVR and Meoprolol dose was increased. He was weaned off oxygen.  Current Outpatient Prescriptions on File Prior to Visit  Medication Sig Dispense Refill  . cetirizine (ZYRTEC) 10 MG tablet Take 10 mg by mouth as needed.     Marland Kitchen ketoconazole (NIZORAL) 2 % shampoo     . metoprolol tartrate (LOPRESSOR) 25 MG tablet Take 1 tablet (25 mg total) by mouth 2 (two) times daily. 30 tablet 1  . pravastatin (PRAVACHOL) 20 MG tablet     . traMADol (ULTRAM) 50 MG tablet     . warfarin (COUMADIN) 2 MG tablet Take 2 mg by mouth daily.      No current facility-administered medications on file prior to visit.     Allergies  Allergen Reactions  . Penicillins Other (See Comments)    Has patient had a PCN reaction causing immediate rash, facial/tongue/throat swelling, SOB or lightheadedness with hypotension: no Has patient had a PCN reaction causing severe rash involving mucus membranes or skin necrosis: no Has patient had a PCN reaction that required hospitalization no Has patient had  a PCN reaction occurring within the last 10 years: no If all of the above answers are "NO", then may proceed with Cephalosporin use.     Past Medical History:  Diagnosis Date  . Allergic rhinitis   . Atrial fibrillation ([place])   . CIDP (chronic inflammatory demyelinating polyneuropathy) ([place])   . Colonic polyp   . H/O: CVA (cerebrovascular accident)   . Hyperbilirubinemia   . Hyperlipidemia   . Long term (current) use of anticoagulants   . Mitral regurgitation   . Nephrolithiasis   . Nevus, atypical   . Osteopenia   . Recurrent boils   . Unsteady gait     Past Surgical History:  Procedure Laterality Date  . APPENDECTOMY      History  Smoking Status  . Former Smoker  . Quit date: 10/06/1973  Smokeless Tobacco  . Never Used    History  Alcohol Use  . Yes    Comment: usually have a couple of drinks a day    No family history on file.  Review of Systems: The review of systems is as noted in HPI. All other systems were reviewed and are negative.  Physical Exam: There were no vitals taken for this visit. He is a pleasant, elderly white male in no acute distress. HEENT: Normal.  Lungs: Clear Cardiovascular: Irregular rate and rhythm. Normal S1 and S2. Grade 1-2/4 holosystolic murmur heard at the left sternal border radiating to the axilla. Abdomen:  Soft and nontender. No hepatosplenomegaly or masses. No bruits. Extremities: he has braces on both legs. Trace edema. Patient has significant muscle atrophy in both legs with weakness. Skin: Warm and dry. Neuro: Patient has short-term memory loss. He is alert and oriented x3. He walks with a walker.  LABORATORY DATA: Lab Results  Component Value Date   WBC 6.3 04/12/2016   HGB 12.1 (L) 04/12/2016   HCT 37.2 (L) 04/12/2016   PLT 148 (L) 04/12/2016   GLUCOSE 97 04/12/2016   ALT 15 (L) 04/08/2016   AST 47 (H) 04/08/2016   NA 141 04/12/2016   K 3.1 (L) 04/12/2016   CL 107 04/12/2016   CREATININE 0.70 04/12/2016    BUN 14 04/12/2016   CO2 25 04/12/2016   INR 3.32 04/12/2016     Ecg dated 04/08/16 showed atrial fibrillation with rate 151 bpm. Nonspecific ST-T wave abnormality. Otherwise normal. I have personally reviewed and interpreted this study.  Echo 04/09/16: Study Conclusions  - Left ventricle: The cavity size was normal. Wall thickness was   increased in a pattern of mild LVH. Indeterminant diastolic   function (atrial fibrillation). The estimated ejection fraction   was 50%. Mild diffuse hypokinesis. - Aortic valve: Multiple gradients measured and labeled as aortic   valve doppler, numbers vary widely and I cannot tell from where   some of the measurements are being obtained. Aortic valve   interrogation needs to be re-done. - Mitral valve: Mildly calcified annulus. Probably severe   eccentric, anteriorly-directed mitral regurgitation. Suspect   partial flail posterior leaflet (P1/P2 scallops). Not fully   visualized. - Left atrium: The atrium was moderately dilated. - Right ventricle: The cavity size was normal. Systolic function   was mildly reduced. - Right atrium: The atrium was severely dilated. - Tricuspid valve: Peak RV-RA gradient (S): 53 mm Hg. - Pulmonary arteries: PA peak pressure: 68 mm Hg (S). - Systemic veins: IVC measured 2.7 cm with < 50% respirophasic   variation, suggesting RA pressure 15 mmHg.  Impressions:  - Technically difficult study with poor acoustic windows. The   patient was in atrial fibrillation. Normal LV size with mild LV   hypertrophy. EF 50%, diffuse hypokinesis. Suspect severe mitral   regurgitation. Jet not fully visualized due to eccentricity   (anteriorly directed). Valve not fully visualized due to poor   image quality. Suspect partial flail posterior leaflet (P1/P2).   Normal RV size and systolic function. Moderate pulmonary   hypertension. There is at least mild aortic stenosis. The aortic   valve doppler is difficult to interpret and  needs to be re-done.   Assessment / Plan: 1. Severe Mitral insufficiency secondary to MV prolapse with partially flail posterior leaflet.   Last Echo 7/15. I would argue against mitral valve repair or Mitral clip given his advanced age and multiple comorbidities. He is asymptomatic and has no evidence of congestive heart failure on exam. His LV function is preserved so his long-term prognosis is probably not significantly impacted by his mitral insufficiency. We will continue his medical therapy and follow up in one year.  2. Atrial fibrillation. Rate is well controlled and he is on chronic anticoagulation. Although he has experienced some falls the benefit of anticoagulation still outweighs the risk of bleeding complications.   3. CIDP.  4. Dementia.  5. Remote CVA.  6. Hyperlipidemia, well controlled on pravastatin.

## 2016-07-30 ENCOUNTER — Other Ambulatory Visit (HOSPITAL_COMMUNITY): Payer: Self-pay | Admitting: Internal Medicine

## 2016-07-30 ENCOUNTER — Ambulatory Visit: Payer: Private Health Insurance - Indemnity | Admitting: Cardiology

## 2016-07-30 DIAGNOSIS — R1310 Dysphagia, unspecified: Secondary | ICD-10-CM

## 2016-07-31 ENCOUNTER — Encounter: Payer: Self-pay | Admitting: *Deleted

## 2016-08-01 ENCOUNTER — Ambulatory Visit (HOSPITAL_COMMUNITY)
Admission: RE | Admit: 2016-08-01 | Discharge: 2016-08-01 | Disposition: A | Discharge status: Home / Self Care | Payer: Medicare Other | Source: Ambulatory Visit | Attending: Internal Medicine | Admitting: Internal Medicine

## 2016-08-01 DIAGNOSIS — R1310 Dysphagia, unspecified: Secondary | ICD-10-CM | POA: Diagnosis not present

## 2016-08-01 DIAGNOSIS — I69891 Dysphagia following other cerebrovascular disease: Secondary | ICD-10-CM | POA: Diagnosis not present

## 2016-08-02 DIAGNOSIS — D649 Anemia, unspecified: Secondary | ICD-10-CM | POA: Diagnosis not present

## 2016-08-02 DIAGNOSIS — I639 Cerebral infarction, unspecified: Secondary | ICD-10-CM | POA: Diagnosis not present

## 2016-08-02 DIAGNOSIS — I480 Paroxysmal atrial fibrillation: Secondary | ICD-10-CM | POA: Diagnosis not present

## 2016-08-09 ENCOUNTER — Telehealth: Payer: Self-pay | Admitting: Physician Assistant

## 2016-08-09 ENCOUNTER — Encounter: Payer: Self-pay | Admitting: Gastroenterology

## 2016-08-09 DIAGNOSIS — I5022 Chronic systolic (congestive) heart failure: Secondary | ICD-10-CM | POA: Diagnosis not present

## 2016-08-09 DIAGNOSIS — R0602 Shortness of breath: Secondary | ICD-10-CM | POA: Diagnosis not present

## 2016-08-09 DIAGNOSIS — R1313 Dysphagia, pharyngeal phase: Secondary | ICD-10-CM | POA: Diagnosis not present

## 2016-08-09 DIAGNOSIS — Z7901 Long term (current) use of anticoagulants: Secondary | ICD-10-CM | POA: Diagnosis not present

## 2016-08-09 DIAGNOSIS — I639 Cerebral infarction, unspecified: Secondary | ICD-10-CM | POA: Diagnosis not present

## 2016-08-09 DIAGNOSIS — R1314 Dysphagia, pharyngoesophageal phase: Secondary | ICD-10-CM | POA: Diagnosis not present

## 2016-08-09 DIAGNOSIS — I4891 Unspecified atrial fibrillation: Secondary | ICD-10-CM | POA: Diagnosis not present

## 2016-08-09 DIAGNOSIS — D649 Anemia, unspecified: Secondary | ICD-10-CM | POA: Diagnosis not present

## 2016-08-09 NOTE — Telephone Encounter (Signed)
Received records from [facility] for appointment on 08/14/16 with Almyra Deforest, PA.  Records put with [facility] Schedule for 08/14/16. lp

## 2016-08-13 DIAGNOSIS — R1314 Dysphagia, pharyngoesophageal phase: Secondary | ICD-10-CM | POA: Diagnosis not present

## 2016-08-13 DIAGNOSIS — R1313 Dysphagia, pharyngeal phase: Secondary | ICD-10-CM | POA: Diagnosis not present

## 2016-08-13 DIAGNOSIS — I639 Cerebral infarction, unspecified: Secondary | ICD-10-CM | POA: Diagnosis not present

## 2016-08-14 ENCOUNTER — Telehealth: Payer: Self-pay | Admitting: Physician Assistant

## 2016-08-14 ENCOUNTER — Encounter: Payer: Self-pay | Admitting: Physician Assistant

## 2016-08-14 ENCOUNTER — Ambulatory Visit (INDEPENDENT_AMBULATORY_CARE_PROVIDER_SITE_OTHER): Payer: Medicare Other | Admitting: Physician Assistant

## 2016-08-14 VITALS — BP 112/62 | HR 78 | Ht 72.0 in | Wt 158.4 lb

## 2016-08-14 DIAGNOSIS — Z8673 Personal history of transient ischemic attack (TIA), and cerebral infarction without residual deficits: Secondary | ICD-10-CM

## 2016-08-14 DIAGNOSIS — E785 Hyperlipidemia, unspecified: Secondary | ICD-10-CM | POA: Diagnosis not present

## 2016-08-14 DIAGNOSIS — Z79899 Other long term (current) drug therapy: Secondary | ICD-10-CM | POA: Diagnosis not present

## 2016-08-14 DIAGNOSIS — R32 Unspecified urinary incontinence: Secondary | ICD-10-CM

## 2016-08-14 DIAGNOSIS — I482 Chronic atrial fibrillation, unspecified: Secondary | ICD-10-CM

## 2016-08-14 DIAGNOSIS — R600 Localized edema: Secondary | ICD-10-CM

## 2016-08-14 DIAGNOSIS — I340 Nonrheumatic mitral (valve) insufficiency: Secondary | ICD-10-CM | POA: Diagnosis not present

## 2016-08-14 DIAGNOSIS — G6181 Chronic inflammatory demyelinating polyneuritis: Secondary | ICD-10-CM

## 2016-08-14 MED ORDER — ASPIRIN EC 81 MG PO TBEC: 81.0000 mg | DELAYED_RELEASE_TABLET | Freq: Every day | ORAL | 3 refills | Status: DC

## 2016-08-14 NOTE — Telephone Encounter (Signed)
New Message     She needs to know if lab work was done today , and are you discontinuing the coumadin, the papers did not have any orders when he came back

## 2016-08-14 NOTE — Addendum Note (Signed)
Addended by: Ricci Barker on: 08/14/2016 03:04 PM   Modules accepted: Orders

## 2016-08-14 NOTE — Telephone Encounter (Signed)
Spoke to [place] at [facility].Almyra Deforest Pa spoke to [place] he advised to stop Coumadin.Start Aspirin 81 mg daily.I will call back with lab results when they are available.

## 2016-08-14 NOTE — Telephone Encounter (Signed)
Returned call to [place] at [facility].Patient had bmet and inr today.After reviewing Finis Bud note he will be speaking to [place] about stopping coumadin and starting on a aspirin.Advised I will call back when I find out Dr.Jordan's recommendations.

## 2016-08-14 NOTE — Progress Notes (Addendum)
Cardiology Office Note    Date:  08/14/2016   ID:  Rodney Malone, DOB [birth year], MRN [id number]  PCP:  Rodney Redwood, MD  Cardiologist:  Dr. Martinique Neurology: Dr. Ellouise Malone for chronic inflammatory demyelinating polyneuropathy   Chief Complaint  Patient presents with  . Follow-up    seen for Dr. Martinique    History of Present Illness:  Rodney Malone is a 83 y.o. male with PMH of chronic atrial fibrillation, HLD, h/o CVA and severe mitral regurgitation. Patient had a history of mitral valve prolapse with severe MR, he also had CIDP managed by neurology. He is on Coumadin for his chronic atrial fibrillation. Last echocardiogram obtained on 09/28/2013 showed EF 60-65%, no regional wall motion abnormality, moderate flail motion involving the mid scallop of posterior leaflet due to rupture of one or more cords, severe MR directed eccentrically and toward the septum, severely dilated left atrium, PA peak pressure 47 mmHg. His last office visit was on 09/23/2014 at which time he was doing well. It was felt that due to his advanced age and multiple comorbidities, would prefer not to pursue invasive mitral valve repair or mitral clip. He was asymptomatic at the time and had no evidence of congestive heart failure on exam.  I last saw the patient on 12/16/2015 for annual visit, he was doing well at the time. However he does have very frequent falls. He missed appointment with Dr. Martinique as he fell ER building coming up from the parking lot. He lives in [place] with his wife. He was admitted in January for influenza a and urinary tract infection. Cardiac enzyme was peaked at 2.15 and down trending. It was felt it was related to demand ischemia. Cardiology was consulted at the time. He was also treated for acute hypoxemic respiratory failure with hypoxia. He finished a course of Tamiflu. He went to the hospital again in April 2018 after another fall.  For the past several month, he continued to have  thick yellow productive cough. His primary care physician is aware. His last office visit with his primary care physician was on 07/26/2016. He says there has not been much change regarding the degree of cough. He has significant decline in his functional ability since the last time I saw him. He and his wife has moved to [facility] assisted living facility. However given his history of severe MR, he likely will have recurrent heart failure symptoms. On today's physical exam, he has at least 2-3+ pitting edema in bilateral lower extremity, worse on the right side. He also has decreased breath sound in the right base of the longus well I think this likely represented some pleural effusion. I did not hear obvious crackle on physical exam, I do not think he has any interstitial edema in the lung. As far as treatment, I think we can increase the Lasix to 40 mg daily for 2 days before going back to 20 mg daily. Lasix was apparently started by his primary care physician due to concern of heart failure symptoms. Ultimately, I do not think he will be a candidate for 40 mg daily of Lasix unless we start on indwelling catheter, increasing the Lasix likely will make him wheelchair-bound which result in further decline in his functional ability. He has had at least 2 episode of incontinence in the office during today's interview. He likely is very close to palliative care stage. I will discuss with Dr. Martinique regarding the duration of his Coumadin. Given recent supratherapeutic INR and  recurrent fall, I think risk of Coumadin outweighed the benefit. I likely will switch his Coumadin to baby aspirin daily instead.    Past Medical History:  Diagnosis Date  . Allergic rhinitis   . Atrial fibrillation ([place])   . CIDP (chronic inflammatory demyelinating polyneuropathy) ([place])   . Colonic polyp   . H/O: CVA (cerebrovascular accident)   . Hyperbilirubinemia   . Hyperlipidemia   . Long term (current) use of anticoagulants   .  Mitral regurgitation   . Nephrolithiasis   . Nevus, atypical   . Osteopenia   . Recurrent boils   . Unsteady gait     Past Surgical History:  Procedure Laterality Date  . APPENDECTOMY      Current Medications: Outpatient Medications Prior to Visit  Medication Sig Dispense Refill  . cetirizine (ZYRTEC) 10 MG tablet Take 10 mg by mouth as needed.     Marland Kitchen ketoconazole (NIZORAL) 2 % shampoo     . metoprolol tartrate (LOPRESSOR) 25 MG tablet Take 1 tablet (25 mg total) by mouth 2 (two) times daily. 30 tablet 1  . pravastatin (PRAVACHOL) 20 MG tablet     . traMADol (ULTRAM) 50 MG tablet     . warfarin (COUMADIN) 2 MG tablet Take 2 mg by mouth daily.      No facility-administered medications prior to visit.      Allergies:   Penicillins   Social History   Social History  . Marital status: Married    Spouse name: N/A  . Number of children: 2  . Years of education: N/A   Occupational History  . broadcasting    Social History Main Topics  . Smoking status: Former Smoker    Quit date: 10/06/1973  . Smokeless tobacco: Never Used  . Alcohol use Yes     Comment: usually have a couple of drinks a day  . Drug use: No  . Sexual activity: Not Asked   Other Topics Concern  . None   Social History Narrative   Patient is Married(Rodney Malone). Retired. Lives in  single level home, Independent Living  section at [facility] since 2009.   Former smoker, minimal Alcohol history   Patient has Advanced planning documents: Living Will              Family History:  The patient's family history is not on file.   ROS:   Please see the history of present illness.    ROS All other systems reviewed and are negative.   PHYSICAL EXAM:   VS:  BP 112/62   Pulse 78   Ht 6' (1.829 m)   Wt 158 lb 6.4 oz (71.8 kg)   BMI 21.48 kg/m    GEN: Well nourished, well developed, in no acute distress  HEENT: normal  Neck: no JVD, carotid bruits, or masses Cardiac: irregular; no  murmurs, rubs, or gallops. 2-3+ pitting edema in bilateral feet, worse on the R side. 3/6 systolic murmur Respiratory:  clear to auscultation bilaterally, normal work of breathing GI: soft, nontender, nondistended, + BS MS: no deformity or atrophy  Skin: warm and dry, no rash Neuro:  Alert and Oriented x 3, Strength and sensation are intact Psych: euthymic mood, full affect  Wt Readings from Last 3 Encounters:  08/14/16 158 lb 6.4 oz (71.8 kg)  04/17/16 155 lb (70.3 kg)  04/12/16 154 lb 1.6 oz (69.9 kg)      Studies/Labs Reviewed:   EKG:  EKG is not ordered today.  Recent Labs: 04/08/2016: ALT 15 04/12/2016: BUN 14; Creatinine, Ser 0.70; Hemoglobin 12.1; Platelets 148; Potassium 3.1; Sodium 141   Lipid Panel No results found for: CHOL, TRIG, HDL, CHOLHDL, VLDL, LDLCALC, LDLDIRECT  Additional studies/ records that were reviewed today include:   Echo 09/28/2013 LV EF: 60% -  65%  Study Conclusions  - Left ventricle: The cavity size was at the upper limits of normal. Systolic function was normal. The estimated ejection fraction was in the range of 60% to 65%. Wall motion was normal; there were no regional wall motion abnormalities. - Mitral valve: Moderate flail motion involving the middle scallop of the posterior leaflet due to rupture of one or more chords. There was severe regurgitation directed eccentrically and toward the septum. - Left atrium: The atrium was severely dilated. - Right atrium: The atrium was moderately dilated. - Atrial septum: No defect or patent foramen ovale was identified. - Pulmonary arteries: Systolic pressure was moderately increased. PA peak pressure: 47 mm Hg (S).    Echo 04/09/2016 LV EF: 50%  ------------------------------------------------------------------- Indications:      Atrial fibrillation - 427.31.  ------------------------------------------------------------------- History:   PMH:  Influenza type A. Severe  Mitral Regurgitation. MVP.  Dyspnea.  Risk factors:  Dyslipidemia.  ------------------------------------------------------------------- Study Conclusions  - Left ventricle: The cavity size was normal. Wall thickness was   increased in a pattern of mild LVH. Indeterminant diastolic   function (atrial fibrillation). The estimated ejection fraction   was 50%. Mild diffuse hypokinesis. - Aortic valve: Multiple gradients measured and labeled as aortic   valve doppler, numbers vary widely and I cannot tell from where   some of the measurements are being obtained. Aortic valve   interrogation needs to be re-done. - Mitral valve: Mildly calcified annulus. Probably severe   eccentric, anteriorly-directed mitral regurgitation. Suspect   partial flail posterior leaflet (P1/P2 scallops). Not fully   visualized. - Left atrium: The atrium was moderately dilated. - Right ventricle: The cavity size was normal. Systolic function   was mildly reduced. - Right atrium: The atrium was severely dilated. - Tricuspid valve: Peak RV-RA gradient (S): 53 mm Hg. - Pulmonary arteries: PA peak pressure: 68 mm Hg (S). - Systemic veins: IVC measured 2.7 cm with < 50% respirophasic   variation, suggesting RA pressure 15 mmHg.  Impressions:  - Technically difficult study with poor acoustic windows. The   patient was in atrial fibrillation. Normal LV size with mild LV   hypertrophy. EF 50%, diffuse hypokinesis. Suspect severe mitral   regurgitation. Jet not fully visualized due to eccentricity   (anteriorly directed). Valve not fully visualized due to poor   image quality. Suspect partial flail posterior leaflet (P1/P2).   Normal RV size and systolic function. Moderate pulmonary   hypertension. There is at least mild aortic stenosis. The aortic   valve doppler is difficult to interpret and needs to be re-done.   ASSESSMENT:    1. Lower extremity edema   2. Medication management   3. Chronic atrial  fibrillation ([place])   4. CIDP (chronic inflammatory demyelinating polyneuropathy) ([facility])   5. Hyperlipidemia, unspecified hyperlipidemia type   6. H/O: CVA (cerebrovascular accident)   7. Severe mitral regurgitation   8. Urinary incontinence, unspecified type      PLAN:  In order of problems listed above:  1. LE edema: Apparently this has been going on for many years. Although recently there are signs of increasing lower extremity edema. He was placed on 20 mg daily of Lasix  by primary care physician. On today's physical exam, he continued to have 2-3+ pitting edema in the bilateral lower extremity, worse on the right side. I'm hesitant to permanently increase Lasix dose to 40 mg daily as I think doing so will take away any quality of life left and also make him wheelchair-bound. I will add Lasix 40 mg daily for 2 days before going back to 20 meg one daily thereafter. Basic metabolic panel today.  2. Chronic productive cough: He has been coughing up thick yellow phlegm, this likely is residual from recent bronchitis or pneumonia. Will defer to primary care physician for further evaluation. I did not hear anything crack on physical exam today, unlikely to have significant amount of pulmonary edema at this point. I think the productive cough is more of a pulmonary issue at this point.  3. Severe MR: This has been known for several years, given his age, very unlikely to be any surgical candidate. Continue medical therapy for heart failure . 4. Chronic atrial fibrillation: Heart rate very well controlled on metoprolol. We'll continue this medication. I will discuss with Dr. Martinique, there have been several episodes of recurrent fall recently, his INR has been supratherapeutic as well. We will recheck a PT/INR today. I would favor discontinuing Coumadin and switch to 81 mg aspirin.  Addendum: case discussed with Dr. Martinique who agrees with discontinuation of coumadin and switch to 81mg  ASA. I did discuss  with patient's daughter during today visit, family understands that by discontinue coumadin, his risk of stroke goes up, but risk of bleeding maybe decreased. We both agree that his risk of bleeding outweigh the risk of stroke  5. Hyperlipidemia: on pravachol    Medication Adjustments/Labs and Tests Ordered: Current medicines are reviewed at length with the patient today.  Concerns regarding medicines are outlined above.  Medication changes, Labs and Tests ordered today are listed in the Patient Instructions below. Patient Instructions  Medication Instructions: Please take 40 mg Furosemide for two days then continue the 20 mg daily.  Your physician recommends that you have the following labs drawn today: BMET and PT/INR   Follow-Up: Your physician recommends that you schedule a follow-up appointment in 2 weeks with Dr. Martinique.   If you need a refill on your cardiac medications before your next appointment, please call your pharmacy.      Hilbert Corrigan, [place]  08/14/2016 9:47 AM    [address], [place], [place]  [address] Phone: [phone]; Fax: [phone]

## 2016-08-14 NOTE — Patient Instructions (Addendum)
Medication Instructions: Please take 40 mg Furosemide for two days then continue the 20 mg daily.  Your physician recommends that you have the following labs drawn today: BMET and PT/INR   Follow-Up: Your physician recommends that you schedule a follow-up appointment in 2 weeks with Dr. Martinique.   If you need a refill on your cardiac medications before your next appointment, please call your pharmacy.

## 2016-08-15 ENCOUNTER — Encounter: Payer: Self-pay | Admitting: Gastroenterology

## 2016-08-15 DIAGNOSIS — R1314 Dysphagia, pharyngoesophageal phase: Secondary | ICD-10-CM | POA: Diagnosis not present

## 2016-08-15 DIAGNOSIS — R1313 Dysphagia, pharyngeal phase: Secondary | ICD-10-CM | POA: Diagnosis not present

## 2016-08-15 DIAGNOSIS — I639 Cerebral infarction, unspecified: Secondary | ICD-10-CM | POA: Diagnosis not present

## 2016-08-15 LAB — BASIC METABOLIC PANEL
BUN/Creatinine Ratio: 21 (ref 10–24)
BUN: 18 mg/dL (ref 10–36)
CALCIUM: 10.2 mg/dL (ref 8.6–10.2)
CHLORIDE: 104 mmol/L (ref 96–106)
CO2: 25 mmol/L (ref 18–29)
Creatinine, Ser: 0.87 mg/dL (ref 0.76–1.27)
GFR calc Af Amer: 87 mL/min/{1.73_m2} (ref >59)
GFR calc non Af Amer: 75 mL/min/{1.73_m2} (ref >59)
GLUCOSE: 77 mg/dL (ref 65–99)
POTASSIUM: 5 mmol/L (ref 3.5–5.2)
SODIUM: 144 mmol/L (ref 134–144)

## 2016-08-15 LAB — PROTIME-INR
INR: 2 — AB (ref 0.8–1.2)
PROTHROMBIN TIME: 20.7 s — AB (ref 9.1–12.0)

## 2016-08-15 NOTE — Progress Notes (Signed)
INR borderline therapeutic, not as high as before. This is reassuring. As mentioned in yesterday's phone call, I spoke with Dr. Martinique, who also agrees, with recent decline in health and frequent fall, best option is too stop coumadin and change to 81mg  aspirin. Renal function and electrolyte stable.

## 2016-08-15 NOTE — Telephone Encounter (Signed)
Spoke to [place] at [facility] follow up appointment scheduled with Dr.Jordan 09/11/16 at 2:00 pm.

## 2016-08-16 DIAGNOSIS — I5021 Acute systolic (congestive) heart failure: Secondary | ICD-10-CM | POA: Diagnosis not present

## 2016-08-16 DIAGNOSIS — R0602 Shortness of breath: Secondary | ICD-10-CM | POA: Diagnosis not present

## 2016-08-16 NOTE — Telephone Encounter (Signed)
Spoke to [place] at [facility] patient's recent renal function and electrolytes stable.Copy of lab faxed to her at fax # [phone].

## 2016-08-17 DIAGNOSIS — R1313 Dysphagia, pharyngeal phase: Secondary | ICD-10-CM | POA: Diagnosis not present

## 2016-08-17 DIAGNOSIS — I639 Cerebral infarction, unspecified: Secondary | ICD-10-CM | POA: Diagnosis not present

## 2016-08-17 DIAGNOSIS — R1314 Dysphagia, pharyngoesophageal phase: Secondary | ICD-10-CM | POA: Diagnosis not present

## 2016-08-20 DIAGNOSIS — I639 Cerebral infarction, unspecified: Secondary | ICD-10-CM | POA: Diagnosis not present

## 2016-08-20 DIAGNOSIS — R1314 Dysphagia, pharyngoesophageal phase: Secondary | ICD-10-CM | POA: Diagnosis not present

## 2016-08-20 DIAGNOSIS — R1313 Dysphagia, pharyngeal phase: Secondary | ICD-10-CM | POA: Diagnosis not present

## 2016-08-22 DIAGNOSIS — I639 Cerebral infarction, unspecified: Secondary | ICD-10-CM | POA: Diagnosis not present

## 2016-08-22 DIAGNOSIS — R1314 Dysphagia, pharyngoesophageal phase: Secondary | ICD-10-CM | POA: Diagnosis not present

## 2016-08-22 DIAGNOSIS — R1313 Dysphagia, pharyngeal phase: Secondary | ICD-10-CM | POA: Diagnosis not present

## 2016-08-24 DIAGNOSIS — R1313 Dysphagia, pharyngeal phase: Secondary | ICD-10-CM | POA: Diagnosis not present

## 2016-08-24 DIAGNOSIS — R1314 Dysphagia, pharyngoesophageal phase: Secondary | ICD-10-CM | POA: Diagnosis not present

## 2016-08-24 DIAGNOSIS — I639 Cerebral infarction, unspecified: Secondary | ICD-10-CM | POA: Diagnosis not present

## 2016-08-27 DIAGNOSIS — R1314 Dysphagia, pharyngoesophageal phase: Secondary | ICD-10-CM | POA: Diagnosis not present

## 2016-08-27 DIAGNOSIS — R1313 Dysphagia, pharyngeal phase: Secondary | ICD-10-CM | POA: Diagnosis not present

## 2016-08-27 DIAGNOSIS — I639 Cerebral infarction, unspecified: Secondary | ICD-10-CM | POA: Diagnosis not present

## 2016-08-28 DIAGNOSIS — R1314 Dysphagia, pharyngoesophageal phase: Secondary | ICD-10-CM | POA: Diagnosis not present

## 2016-08-28 DIAGNOSIS — I639 Cerebral infarction, unspecified: Secondary | ICD-10-CM | POA: Diagnosis not present

## 2016-08-28 DIAGNOSIS — R1313 Dysphagia, pharyngeal phase: Secondary | ICD-10-CM | POA: Diagnosis not present

## 2016-08-31 DIAGNOSIS — R1314 Dysphagia, pharyngoesophageal phase: Secondary | ICD-10-CM | POA: Diagnosis not present

## 2016-08-31 DIAGNOSIS — R1313 Dysphagia, pharyngeal phase: Secondary | ICD-10-CM | POA: Diagnosis not present

## 2016-08-31 DIAGNOSIS — I639 Cerebral infarction, unspecified: Secondary | ICD-10-CM | POA: Diagnosis not present

## 2016-09-03 DIAGNOSIS — R1313 Dysphagia, pharyngeal phase: Secondary | ICD-10-CM | POA: Diagnosis not present

## 2016-09-03 DIAGNOSIS — R1314 Dysphagia, pharyngoesophageal phase: Secondary | ICD-10-CM | POA: Diagnosis not present

## 2016-09-03 DIAGNOSIS — I639 Cerebral infarction, unspecified: Secondary | ICD-10-CM | POA: Diagnosis not present

## 2016-09-04 DIAGNOSIS — I639 Cerebral infarction, unspecified: Secondary | ICD-10-CM | POA: Diagnosis not present

## 2016-09-04 DIAGNOSIS — R1314 Dysphagia, pharyngoesophageal phase: Secondary | ICD-10-CM | POA: Diagnosis not present

## 2016-09-04 DIAGNOSIS — R1313 Dysphagia, pharyngeal phase: Secondary | ICD-10-CM | POA: Diagnosis not present

## 2016-09-09 NOTE — Progress Notes (Signed)
Rodney Malone Date of Birth: 27-Apr-1922 Medical Record #[id number]  History of Present Illness: Rodney Malone is seen for followup of mitral insufficiency and atrial fibrillation. 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 He has a history of chronic atrial fibrillation and had been on Coumadin. He has a remote history of stroke. He is limited predominantly by his Neurologic issues. His walking is very limited. He uses a walker. He is now living at [facility]. He was recently seen by Rodney Deforest PA who noted significant decline in his functional status with multiple falls. Based on this history his coumadin was discontinued and he was started on ASA daily. He was noted to have 2-3+ edema at his last visit and lasix was increased for a couple of days but there was concern that he would not tolerate higher lasix doses.Last echocardiogram obtained Jan 2018 showed EF 60%, no regional wall motion abnormality but mild diffuse hypokinesis, moderate flail motion involving the mid scallop of posterior leaflet due to rupture of one ormore cords, severe MR directed eccentrically and toward the septum, severely dilated left atrium, moderate pulmonary HTN.  Per prior discussions it was felt that due to his advanced age and multiple comorbidities, would prefer not to pursue invasive mitral valve repair or mitral clip and the patient did not want to pursue invasive options.  He was admitted in January 2018 with SIRS due to influenza and UTI. Mildly elevated troponin felt to be due to demand ischemia. Noted to have a chronic cough with phlegm production on last visit.   On follow up today he complains of feeling worn out. He gets SOB occasionally with activity. Relieved with rest. No orthopnea or PND. He denies any chest pain. His weight is down 4 lbs. He states he has terrible edema. He denies any dizziness, palpitations, or syncope. He still has some productive cough. He is seen with his wife and neither one of  them understands what medicine he is on- states they just give him his pills. Medicine list updated based on [facility] records.  Current Outpatient Prescriptions on File Prior to Visit  Medication Sig Dispense Refill  . aspirin EC 81 MG tablet Take 1 tablet (81 mg total) by mouth daily. 90 tablet 3  . cetirizine (ZYRTEC) 10 MG tablet Take 10 mg by mouth as needed.     . furosemide (LASIX) 20 MG tablet Take 20 mg by mouth daily.    . pravastatin (PRAVACHOL) 20 MG tablet     . traMADol (ULTRAM) 50 MG tablet      No current facility-administered medications on file prior to visit.     Allergies  Allergen Reactions  . Penicillins Other (See Comments)    Has patient had a PCN reaction causing immediate rash, facial/tongue/throat swelling, SOB or lightheadedness with hypotension: no Has patient had a PCN reaction causing severe rash involving mucus membranes or skin necrosis: no Has patient had a PCN reaction that required hospitalization no Has patient had a PCN reaction occurring within the last 10 years: no If all of the above answers are "NO", then may proceed with Cephalosporin use.     Past Medical History:  Diagnosis Date  . Allergic rhinitis   . Atrial fibrillation ([place])   . CIDP (chronic inflammatory demyelinating polyneuropathy) ([place])   . Colonic polyp   . H/O: CVA (cerebrovascular accident)   . Hyperbilirubinemia   . Hyperlipidemia   . Long term (current) use of anticoagulants   .  Mitral regurgitation   . Nephrolithiasis   . Nevus, atypical   . Osteopenia   . Recurrent boils   . Unsteady gait     Past Surgical History:  Procedure Laterality Date  . APPENDECTOMY      History  Smoking Status  . Former Smoker  . Quit date: 10/06/1973  Smokeless Tobacco  . Never Used    History  Alcohol Use  . Yes    Comment: usually have a couple of drinks a day    History reviewed. No pertinent family history.  Review of Systems: The review of systems is as noted in  HPI. All other systems were reviewed and are negative.  Physical Exam: BP (!) 84/56 Comment: Right arm.  Pulse 70   Ht 6' (1.829 m)   Wt 154 lb (69.9 kg)   BMI 20.89 kg/m  He is a pleasant, elderly white male in no acute distress. HEENT: Normal.  Lungs: left basilar crackles. Cardiovascular: Irregular rate and rhythm. Normal S1 and S2. Grade 3/6 holosystolic murmur heard at the left sternal border radiating to the axilla. Abdomen: Soft and nontender. No hepatosplenomegaly or masses. No bruits. Extremities: he has braces on both legs. 2+ RLE edema. Trace on left. Patient has significant muscle atrophy in both legs with weakness. Skin: Warm and dry. Neuro: Patient has short-term memory loss. He is alert and oriented x3. He walks with a walker.  LABORATORY DATA: Dated 01/05/16: cholesterol 96, triglycerides 78. HDL 36, LDL 44. TSH normal.  Dated 08/09/16: Normal BMET. BNP 8054 Dated 08/14/16: normal chemistry panel.   Ecg today shows atrial fibrillation with rate 91 bpm. Prolonged QTc  487 msec. Otherwise normal. I have personally reviewed and interpreted this study.  Echo 04/09/16: Study Conclusions  - Left ventricle: The cavity size was normal. Wall thickness was   increased in a pattern of mild LVH. Indeterminant diastolic   function (atrial fibrillation). The estimated ejection fraction   was 50%. Mild diffuse hypokinesis. - Aortic valve: Multiple gradients measured and labeled as aortic   valve doppler, numbers vary widely and I cannot tell from where   some of the measurements are being obtained. Aortic valve   interrogation needs to be re-done. - Mitral valve: Mildly calcified annulus. Probably severe   eccentric, anteriorly-directed mitral regurgitation. Suspect   partial flail posterior leaflet (P1/P2 scallops). Not fully   visualized. - Left atrium: The atrium was moderately dilated. - Right ventricle: The cavity size was normal. Systolic function   was mildly reduced. -  Right atrium: The atrium was severely dilated. - Tricuspid valve: Peak RV-RA gradient (S): 53 mm Hg. - Pulmonary arteries: PA peak pressure: 68 mm Hg (S). - Systemic veins: IVC measured 2.7 cm with < 50% respirophasic   variation, suggesting RA pressure 15 mmHg.  Impressions:  - Technically difficult study with poor acoustic windows. The   patient was in atrial fibrillation. Normal LV size with mild LV   hypertrophy. EF 50%, diffuse hypokinesis. Suspect severe mitral   regurgitation. Jet not fully visualized due to eccentricity   (anteriorly directed). Valve not fully visualized due to poor   image quality. Suspect partial flail posterior leaflet (P1/P2).   Normal RV size and systolic function. Moderate pulmonary   hypertension. There is at least mild aortic stenosis. The aortic   valve doppler is difficult to interpret and needs to be re-done.   Assessment / Plan: 1. Severe Mitral insufficiency secondary to MV prolapse with partially flail posterior leaflet.  Last Echo January 2018.he is a poor candidate for MV repair or Mitraclip due to advanced age, frailty, and CIDP. Also has component of dementia. He has some edema but not severe. He will not tolerate higher lasix dose due to hypotension. Will continue current therapy and follow up in 2 months.  2. Atrial fibrillation. Rate is well controlled. Due to multiple falls and declining functional status I feel the risks of anticoagulation outweigh the benefits at this point. Continue ASA.   3. CIDP progressive decline.  4. Dementia.  5. Remote CVA.  6. Hyperlipidemia,  on pravastatin.

## 2016-09-11 ENCOUNTER — Ambulatory Visit (INDEPENDENT_AMBULATORY_CARE_PROVIDER_SITE_OTHER): Payer: Medicare Other | Admitting: Cardiology

## 2016-09-11 ENCOUNTER — Encounter: Payer: Self-pay | Admitting: Cardiology

## 2016-09-11 VITALS — BP 84/56 | HR 70 | Ht 72.0 in | Wt 154.0 lb

## 2016-09-11 DIAGNOSIS — I482 Chronic atrial fibrillation, unspecified: Secondary | ICD-10-CM

## 2016-09-11 DIAGNOSIS — I5032 Chronic diastolic (congestive) heart failure: Secondary | ICD-10-CM | POA: Diagnosis not present

## 2016-09-11 DIAGNOSIS — I340 Nonrheumatic mitral (valve) insufficiency: Secondary | ICD-10-CM | POA: Diagnosis not present

## 2016-09-11 MED ORDER — METOPROLOL SUCCINATE ER 100 MG PO TB24: 100.0000 mg | ORAL_TABLET | Freq: Every day | ORAL | 3 refills | Status: DC

## 2016-09-11 NOTE — Patient Instructions (Signed)
Continue your current therapy  I will see you in 4 months  

## 2016-09-26 ENCOUNTER — Ambulatory Visit: Payer: Private Health Insurance - Indemnity | Admitting: Gastroenterology

## 2016-09-27 NOTE — Progress Notes (Signed)
Rodney Malone Date of Birth: [birth year] Medical Record #[id number]  History of Present Illness: Rodney Malone is seen at the request of his family today. He has a history of severe MR and chronic AFib.  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 He has a history of chronic atrial fibrillation and had been on Coumadin.  He is limited predominantly by his Neurologic issues. His walking is very limited. He uses a walker. He is now living at [facility]. He has significant functional decline the past 6 months.  Based on this history his coumadin was discontinued and he was started on ASA daily. He has chronic edema.Last echocardiogram obtained Jan 2018 showed EF 60%, no regional wall motion abnormality but mild diffuse hypokinesis, moderate flail motion involving the mid scallop of posterior leaflet due to rupture of one ormore cords, severe MR directed eccentrically and toward the septum, severely dilated left atrium, moderate pulmonary HTN.  Per prior discussions it was felt that due to his advanced age and multiple comorbidities, would prefer not to pursue invasive mitral valve repair or mitral clip and the patient did not want to pursue invasive options.  He was admitted in January 2018 with SIRS due to influenza and UTI. Mildly elevated troponin felt to be due to demand ischemia. Noted to have a chronic cough with phlegm production on last visit.   On follow up today he is seen with his wife and daughter. They note than he has chronic urinary incontinence and more recently is having daily diarrhea- typically after lunch. He was referred to GI by Dr. Brigitte Pulse but patient refused to go. That is the other problem that the family noted- he is more confused and fairly obstinate about not letting people help him.   Current Outpatient Prescriptions on File Prior to Visit  Medication Sig Dispense Refill  . aspirin EC 81 MG tablet Take 1 tablet (81 mg total) by mouth daily. 90 tablet 3  . cetirizine  (ZYRTEC) 10 MG tablet Take 10 mg by mouth as needed.     . furosemide (LASIX) 20 MG tablet Take 20 mg by mouth daily.    . metoprolol succinate (TOPROL-XL) 100 MG 24 hr tablet Take 1 tablet (100 mg total) by mouth daily. Take with or immediately following a meal. 90 tablet 3  . traMADol (ULTRAM) 50 MG tablet      No current facility-administered medications on file prior to visit.     Allergies  Allergen Reactions  . Penicillins Other (See Comments)    Has patient had a PCN reaction causing immediate rash, facial/tongue/throat swelling, SOB or lightheadedness with hypotension: no Has patient had a PCN reaction causing severe rash involving mucus membranes or skin necrosis: no Has patient had a PCN reaction that required hospitalization no Has patient had a PCN reaction occurring within the last 10 years: no If all of the above answers are "NO", then may proceed with Cephalosporin use.     Past Medical History:  Diagnosis Date  . Allergic rhinitis   . Atrial fibrillation ([place])   . CIDP (chronic inflammatory demyelinating polyneuropathy) ([place])   . Colonic polyp   . H/O: CVA (cerebrovascular accident)   . Hyperbilirubinemia   . Hyperlipidemia   . Long term (current) use of anticoagulants   . Mitral regurgitation   . Nephrolithiasis   . Nevus, atypical   . Osteopenia   . Recurrent boils   . Unsteady gait     Past Surgical  History:  Procedure Laterality Date  . APPENDECTOMY      History  Smoking Status  . Former Smoker  . Quit date: 10/06/1973  Smokeless Tobacco  . Never Used    History  Alcohol Use  . Yes    Comment: usually have a couple of drinks a day    No family history on file.  Review of Systems: The review of systems is as noted in HPI. All other systems were reviewed and are negative.  Physical Exam: BP 106/70   Pulse 89   Wt 152 lb (68.9 kg)   BMI 20.61 kg/m  He is a pleasant, elderly white male in no acute distress. HEENT: Normal.  Lungs:  clear today Cardiovascular: Irregular rate and rhythm. Normal S1 and S2. Grade 3/6 holosystolic murmur heard at the left sternal border radiating to the axilla. Abdomen: Soft and nontender. No hepatosplenomegaly or masses. No bruits. Extremities: he has braces on both legs. 1+ R>L LE edema.  Patient has significant muscle atrophy in both legs with weakness. Skin: Warm and dry. Neuro: Patient has short-term memory loss. He is alert and oriented x3. He walks with a walker.  LABORATORY DATA: Dated 01/05/16: cholesterol 96, triglycerides 78. HDL 36, LDL 44. TSH normal.  Dated 08/09/16: Normal BMET. BNP 8054 Dated 08/14/16: normal chemistry panel.   Ecg today shows atrial fibrillation with rate 91 bpm. Prolonged QTc  487 msec. Otherwise normal. I have personally reviewed and interpreted this study.  Echo 04/09/16: Study Conclusions  - Left ventricle: The cavity size was normal. Wall thickness was   increased in a pattern of mild LVH. Indeterminant diastolic   function (atrial fibrillation). The estimated ejection fraction   was 50%. Mild diffuse hypokinesis. - Aortic valve: Multiple gradients measured and labeled as aortic   valve doppler, numbers vary widely and I cannot tell from where   some of the measurements are being obtained. Aortic valve   interrogation needs to be re-done. - Mitral valve: Mildly calcified annulus. Probably severe   eccentric, anteriorly-directed mitral regurgitation. Suspect   partial flail posterior leaflet (P1/P2 scallops). Not fully   visualized. - Left atrium: The atrium was moderately dilated. - Right ventricle: The cavity size was normal. Systolic function   was mildly reduced. - Right atrium: The atrium was severely dilated. - Tricuspid valve: Peak RV-RA gradient (S): 53 mm Hg. - Pulmonary arteries: PA peak pressure: 68 mm Hg (S). - Systemic veins: IVC measured 2.7 cm with < 50% respirophasic   variation, suggesting RA pressure 15  mmHg.  Impressions:  - Technically difficult study with poor acoustic windows. The   patient was in atrial fibrillation. Normal LV size with mild LV   hypertrophy. EF 50%, diffuse hypokinesis. Suspect severe mitral   regurgitation. Jet not fully visualized due to eccentricity   (anteriorly directed). Valve not fully visualized due to poor   image quality. Suspect partial flail posterior leaflet (P1/P2).   Normal RV size and systolic function. Moderate pulmonary   hypertension. There is at least mild aortic stenosis. The aortic   valve doppler is difficult to interpret and needs to be re-done.   Assessment / Plan: 1. Severe Mitral insufficiency secondary to MV prolapse with partially flail posterior leaflet.   Last Echo January 2018.he is a poor candidate for MV repair or Mitraclip due to advanced age, frailty, and CIDP. His edema is actually better today. While I think the lasix may be contributing somewhat to his urinary incontinence I think it  is a necessary medication for him.   2. Atrial fibrillation. Rate is well controlled. Due to multiple falls and declining functional status I feel the risks of anticoagulation outweigh the benefits at this point. Continue ASA. He needs to continue metoprolol for rate control.   3. CIDP progressive decline.  4. Dementia.  5. Hyperlipidemia,  on pravastatin. I discussed with family- this is one medication that I don't think is essential. He does have an apparent history of CVA but years ago. Last lipids were very low. In an effort to minimize medication therapy in this very old, demented patient I would recommend stopping pravastatin.

## 2016-10-03 ENCOUNTER — Encounter: Payer: Self-pay | Admitting: Cardiology

## 2016-10-03 ENCOUNTER — Ambulatory Visit (INDEPENDENT_AMBULATORY_CARE_PROVIDER_SITE_OTHER): Payer: Medicare Other | Admitting: Cardiology

## 2016-10-03 VITALS — BP 106/70 | HR 89 | Wt 152.0 lb

## 2016-10-03 DIAGNOSIS — I482 Chronic atrial fibrillation, unspecified: Secondary | ICD-10-CM

## 2016-10-03 DIAGNOSIS — I5032 Chronic diastolic (congestive) heart failure: Secondary | ICD-10-CM

## 2016-10-03 DIAGNOSIS — I340 Nonrheumatic mitral (valve) insufficiency: Secondary | ICD-10-CM

## 2016-10-03 NOTE — Patient Instructions (Signed)
Stop taking pravachol  Continue your other therapy  I will see you in 4 months.

## 2016-10-17 ENCOUNTER — Encounter: Payer: Self-pay | Admitting: Podiatry

## 2016-10-17 ENCOUNTER — Ambulatory Visit (INDEPENDENT_AMBULATORY_CARE_PROVIDER_SITE_OTHER): Payer: Medicare Other | Admitting: Podiatry

## 2016-10-17 DIAGNOSIS — B351 Tinea unguium: Secondary | ICD-10-CM | POA: Diagnosis not present

## 2016-10-17 DIAGNOSIS — G6181 Chronic inflammatory demyelinating polyneuritis: Secondary | ICD-10-CM

## 2016-10-17 DIAGNOSIS — M79676 Pain in unspecified toe(s): Secondary | ICD-10-CM

## 2016-10-17 NOTE — Progress Notes (Signed)
Patient ID: Rodney Malone, male   DOB: October 01, 1922, 83 y.o.   MRN: [id number]     Subjective: This patient presents again complaining of thick, elongated toenails with direct shoe pressure and walking and is requesting toenail debridement  Objective: Orientated 3 Bilateral AFOs Bilateral peripheral pitting edema DP and PT pulses 2/4 bilaterally Capillary Reflex immediate bilaterally General rubor bilaterally Dorsi flexion and plantar flexion 0/4 bilaterally Ankle reflexes weakly reactive bilaterally Unsteady gait pattern walking with walker Hammertoe 2-4 left No open skin lesions bilaterally Atrophic skin bilaterally The toenails are elongated, brittle, discolored, hypertrophic and tender direct palpation 6-10  Assessment: Symptomatic onychomycoses 6-10 History of chronic inflammatory demyelinating polyneuropathy  Plan: Debridement toenails 10 mechanically and electrically with slight bleeding distal fourth left toe treated with topical antibiotic ointment and Band-Aid. Patient instructed removed Band-Aid 1-3 days and continue to apply topical antibiotic ointment and Band-Aid until a scab forms  Reappoint 3 months

## 2016-10-17 NOTE — Patient Instructions (Signed)
Removed a Band-Aid on the fourth left toe 1-3 days and apply topical antibiotic ointment such as triple antibiotic ointment and a Band-Aid daily until the scab forms

## 2016-10-29 DIAGNOSIS — R1312 Dysphagia, oropharyngeal phase: Secondary | ICD-10-CM | POA: Diagnosis not present

## 2016-10-29 DIAGNOSIS — R197 Diarrhea, unspecified: Secondary | ICD-10-CM | POA: Diagnosis not present

## 2016-10-29 DIAGNOSIS — E46 Unspecified protein-calorie malnutrition: Secondary | ICD-10-CM | POA: Diagnosis not present

## 2016-10-29 DIAGNOSIS — Z6820 Body mass index (BMI) 20.0-20.9, adult: Secondary | ICD-10-CM | POA: Diagnosis not present

## 2016-10-29 DIAGNOSIS — I509 Heart failure, unspecified: Secondary | ICD-10-CM | POA: Diagnosis not present

## 2016-10-30 DIAGNOSIS — D485 Neoplasm of uncertain behavior of skin: Secondary | ICD-10-CM | POA: Diagnosis not present

## 2016-10-30 DIAGNOSIS — L814 Other melanin hyperpigmentation: Secondary | ICD-10-CM | POA: Diagnosis not present

## 2016-10-30 DIAGNOSIS — H61891 Other specified disorders of right external ear: Secondary | ICD-10-CM | POA: Diagnosis not present

## 2016-11-12 DIAGNOSIS — M79671 Pain in right foot: Secondary | ICD-10-CM | POA: Diagnosis not present

## 2016-11-12 DIAGNOSIS — M25571 Pain in right ankle and joints of right foot: Secondary | ICD-10-CM | POA: Diagnosis not present

## 2016-11-27 ENCOUNTER — Encounter (INDEPENDENT_AMBULATORY_CARE_PROVIDER_SITE_OTHER): Payer: Self-pay

## 2016-11-27 ENCOUNTER — Ambulatory Visit (INDEPENDENT_AMBULATORY_CARE_PROVIDER_SITE_OTHER): Payer: Medicare Other | Admitting: Gastroenterology

## 2016-11-27 ENCOUNTER — Other Ambulatory Visit: Payer: Self-pay

## 2016-11-27 ENCOUNTER — Encounter: Payer: Self-pay | Admitting: Gastroenterology

## 2016-11-27 VITALS — BP 70/48 | HR 64 | Ht 71.75 in | Wt 151.0 lb

## 2016-11-27 DIAGNOSIS — R197 Diarrhea, unspecified: Secondary | ICD-10-CM | POA: Diagnosis not present

## 2016-11-27 NOTE — Progress Notes (Signed)
HPI: This is a  very pleasant 83 year old man  who was referred to me by Rodney Redwood, MD  to evaluate  diarrhea .    Chief complaint is diarrhea  He has mild to moderate dementia and is clearly confused here in the office today. She was convinced that he is here to talk about the swelling in his feet. His daughter redirected him 2 or 3 times about this.   His wife and daughter are with him and they greatly helped with his history. It does sound like he has had some troubles with his stools recently, he has had a couple incontinence issues. He admits to having some diarrhea at times. It doesn't seem to be getting him up in the middle the night.  It doesn't seem like he has any overt bleeding.    I don't think he has had any stool testing recently.  Old Data Reviewed: echocardiogram obtained Jan 2018 showed EF 60%, no regional wall motion abnormality but mild diffuse hypokinesis, moderate flail motion involving the mid scallop of posterior leaflet due to rupture of one ormore cords, severe MR directed eccentrically and toward the septum, severely dilated left atrium, moderate pulmonary HTN.   Labs by PCP 10/2016: cbc normal, cmet normal except T bilirubin 2.0 (chronically elevated)   I saw him in the office 8 years ago, sent by his primary care physician to consider colonoscopy for colon cancer screening. At that time he was 60 and I recommended against screening examination. Colon cancer screening generally stops between the ages of 53 and 58.  Drinks one soda daily.    Review of systems: Pertinent positive and negative review of systems were noted in the above HPI section. All other review negative.   Past Medical History:  Diagnosis Date  . Allergic rhinitis   . Atrial fibrillation ([place])   . CIDP (chronic inflammatory demyelinating polyneuropathy) ([place])   . Colonic polyp   . H/O: CVA (cerebrovascular accident)   . Hyperbilirubinemia   . Hyperlipidemia   . Long term  (current) use of anticoagulants   . Mitral regurgitation   . Nephrolithiasis   . Nevus, atypical   . Osteopenia   . Recurrent boils   . Unsteady gait     Past Surgical History:  Procedure Laterality Date  . APPENDECTOMY      Current Outpatient Prescriptions  Medication Sig Dispense Refill  . acetaminophen (TYLENOL) 325 MG tablet Take 650 mg by mouth every 6 (six) hours as needed.    Marland Kitchen aspirin EC 81 MG tablet Take 1 tablet (81 mg total) by mouth daily. 90 tablet 3  . cetirizine (ZYRTEC) 10 MG tablet Take 10 mg by mouth as needed.     . diphenhydramine-acetaminophen (TYLENOL PM) 25-500 MG TABS tablet Take 1 tablet by mouth at bedtime as needed.    . furosemide (LASIX) 20 MG tablet Take 20 mg by mouth daily.    Marland Kitchen ketoconazole (NIZORAL) 2 % cream Apply 1 application topically as needed for irritation.    . metoprolol succinate (TOPROL-XL) 100 MG 24 hr tablet Take 1 tablet (100 mg total) by mouth daily. Take with or immediately following a meal. 90 tablet 3   No current facility-administered medications for this visit.     Allergies as of 11/27/2016 - Review Complete 11/27/2016  Allergen Reaction Noted  . Penicillins Other (See Comments) 03/25/2012    Family History  Problem Relation Age of Onset  . Colon cancer Neg Hx  Social History   Social History  . Marital status: Married    Spouse name: N/A  . Number of children: 2  . Years of education: N/A   Occupational History  . broadcasting - retired    Social History Main Topics  . Smoking status: Former Smoker    Quit date: 10/06/1973  . Smokeless tobacco: Never Used  . Alcohol use No     Comment: usually have a couple of drinks a day  . Drug use: No  . Sexual activity: Not on file   Other Topics Concern  . Not on file   Social History Narrative   Patient is Married(Rodney Malone). Retired. Lives in  single level home, Independent Living  section at [facility] since 2009.   Former smoker, minimal  Alcohol history   Patient has Advanced planning documents: Living Will              Physical Exam: BP (!) 70/48   Pulse 64   Ht 5' 11.75" (1.822 m)   Wt 151 lb (68.5 kg)   BMI 20.62 kg/m  Constitutional: Frail, elderly man who walks with a walker. sychiatric: alert and oriented x 2 Eyes: extraocular movements intact Mouth: oral pharynx moist, no lesions Neck: supple no lymphadenopathy Cardiovascular: heart regular rate and rhythm Lungs: clear to auscultation bilaterally Abdomen: soft, nontender, nondistended, no obvious ascites, no peritoneal signs, normal bowel sounds Extremities: no lower extremity edema bilaterally Skin: no lesions on visible extremities   Assessment and plan: 83 y.o. male with   abnormal bowel habits, intermittent incontinence and likely diarrhea  still difficult to get a good history from him due to his confusion and dementia. His wife and daughter helped quite a bit here but even they admit they're not really sure how bad his bowels are since he is quite private about it. I do think it safe to say's having some incontinence issues with his bowels. No overt bleeding. I recommended and his daughter completely agree that we should keep it simple for now and he will get stool testing with a GI pathogen panel. I'm also start him on a single Imodium pill on a scheduled basis every morning shortly after waking. Family knows to contact me in for 5 weeks to let me know how he is responding to this. He is clearly too frail with significant comorbidities to undergo significant testing and it is certainly best if we keep this simple and easy for him and his family.   Please see the "Patient Instructions" section for addition details about the plan.   Rodney Loffler, MD [facility] 11/27/2016, 11:23 AM  Cc: Rodney Redwood, MD

## 2016-11-27 NOTE — Patient Instructions (Signed)
Lower caffeine, less fruits and vegetables may improve your bowels, loose stools. You will have labs checked today in the basement lab.  Please head down after you check out with the front desk  (stool for GI pathogen panel). Start one imodium pill every morning on a scheduled basis. Call in 3-4 weeks.to report on your response.

## 2016-12-03 ENCOUNTER — Other Ambulatory Visit: Payer: Medicare Other

## 2016-12-03 DIAGNOSIS — R197 Diarrhea, unspecified: Secondary | ICD-10-CM | POA: Diagnosis not present

## 2016-12-06 ENCOUNTER — Telehealth: Payer: Self-pay | Admitting: Gastroenterology

## 2016-12-06 NOTE — Telephone Encounter (Signed)
Spoke with Beth and let her know the results are not back yet.

## 2016-12-07 LAB — GASTROINTESTINAL PATHOGEN PANEL PCR
C. DIFFICILE TOX A/B, PCR: NOT DETECTED
CRYPTOSPORIDIUM, PCR: NOT DETECTED
Campylobacter, PCR: NOT DETECTED
E COLI (ETEC) LT/ST, PCR: NOT DETECTED
E coli (STEC) stx1/stx2, PCR: NOT DETECTED
E coli 0157, PCR: NOT DETECTED
Giardia lamblia, PCR: NOT DETECTED
Norovirus, PCR: NOT DETECTED
ROTAVIRUS, PCR: NOT DETECTED
Salmonella, PCR: NOT DETECTED
Shigella, PCR: NOT DETECTED

## 2016-12-12 ENCOUNTER — Telehealth: Payer: Self-pay

## 2016-12-12 NOTE — Telephone Encounter (Signed)
Order faxed to [facility] to use Imodium as needed. Can give every other day, titrate as needed for constipation.

## 2017-01-03 DIAGNOSIS — Z23 Encounter for immunization: Secondary | ICD-10-CM | POA: Diagnosis not present

## 2017-01-09 NOTE — Progress Notes (Signed)
Rodney  Timmothy Malone Date of Birth: [birth year] Medical Record #[id number]  History of Present Illness: [facility] Malone is seen for follow up today. He has a history of severe MR and chronic AFib.  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 He has a history of chronic atrial fibrillation and had been on Coumadin.  He is limited predominantly by his Neurologic issues.he is now 83 years old.  His walking is very limited. He is now living at [facility].   Based on his bleeding risk coumadin was discontinued and he was started on ASA daily. He has chronic edema. Last echocardiogram obtained Jan 2018 showed EF 60%, no regional wall motion abnormality but mild diffuse hypokinesis, moderate flail motion involving the mid scallop of posterior leaflet due to rupture of one ormore cords, severe MR directed eccentrically and toward the septum, severely dilated left atrium, moderate pulmonary HTN.  Per prior discussions it was felt that due to his advanced age and multiple comorbidities, would prefer not to pursue invasive mitral valve repair or mitral clip and the patient did not want to pursue invasive options.  He was admitted in January 2018 with SIRS due to influenza and UTI. Mildly elevated troponin felt to be due to demand ischemia. Recovered fairly Rodney from that.  On follow up today he is seen with his wife and daughter. He has chronic urinary incontinence.Marland Kitchen His appetite is not good and he has lost 4 lbs. He has chronic edema and notes pain in his left foot at times.  Current Outpatient Medications on File Prior to Visit  Medication Sig Dispense Refill  . acetaminophen (TYLENOL) 325 MG tablet Take 650 mg by mouth every 6 (six) hours as needed.    Marland Kitchen aspirin EC 81 MG tablet Take 1 tablet (81 mg total) by mouth daily. 90 tablet 3  . cetirizine (ZYRTEC) 10 MG tablet Take 10 mg by mouth as needed.     . diphenhydramine-acetaminophen (TYLENOL PM) 25-500 MG TABS tablet Take 1 tablet by mouth at bedtime as  needed.    . furosemide (LASIX) 20 MG tablet Take 20 mg by mouth daily.    Marland Kitchen ketoconazole (NIZORAL) 2 % cream Apply 1 application topically as needed for irritation.    . metoprolol succinate (TOPROL-XL) 100 MG 24 hr tablet Take 1 tablet (100 mg total) by mouth daily. Take with or immediately following a meal. 90 tablet 3   No current facility-administered medications on file prior to visit.     Allergies  Allergen Reactions  . Penicillins Other (See Comments)    Has patient had a PCN reaction causing immediate rash, facial/tongue/throat swelling, SOB or lightheadedness with hypotension: no Has patient had a PCN reaction causing severe rash involving mucus membranes or skin necrosis: no Has patient had a PCN reaction that required hospitalization no Has patient had a PCN reaction occurring within the last 10 years: no If all of the above answers are "NO", then may proceed with Cephalosporin use.     Past Medical History:  Diagnosis Date  . Allergic rhinitis   . Atrial fibrillation ([place])   . CIDP (chronic inflammatory demyelinating polyneuropathy) ([place])   . Colonic polyp   . H/O: CVA (cerebrovascular accident)   . Hyperbilirubinemia   . Hyperlipidemia   . Long term (current) use of anticoagulants   . Mitral regurgitation   . Nephrolithiasis   . Nevus, atypical   . Osteopenia   . Recurrent boils   . Unsteady  gait     Past Surgical History:  Procedure Laterality Date  . APPENDECTOMY      Social History   Tobacco Use  Smoking Status Former Smoker  . Last attempt to quit: 10/06/1973  . Years since quitting: 43.3  Smokeless Tobacco Never Used    Social History   Substance and Sexual Activity  Alcohol Use No   Comment: usually have a couple of drinks a day    Family History  Problem Relation Age of Onset  . Colon cancer Neg Hx     Review of Systems: The review of systems is as noted in HPI. All other systems were reviewed and are negative.  Physical Exam: BP  107/62   Pulse 85   Ht 5' 11.75" (1.822 m)   Wt 147 lb 6.4 oz (66.9 kg)   BMI 20.13 kg/m  GENERAL:  Elderly, frail WM  HEENT:  PERRL, EOMI, sclera are clear. Oropharynx is clear. NECK:  + jugular venous distention, carotid upstroke brisk and symmetric, no bruits, no thyromegaly or adenopathy LUNGS:  Clear to auscultation bilaterally CHEST:  Unremarkable HEART:  IRRR,  PMI not displaced or sustained,S1 and S2 within normal limits, no S3, no S4: no clicks, no rubs, gr 3/6 holosystolic murmur apex radiating to axilla.  ABD:  Soft, nontender. BS +, no masses or bruits. No hepatomegaly, no splenomegaly EXT:  2 + pulses throughout, 1-2+ bilateral edema, toes are purple and swollen. No skin breakdown. SKIN:  Warm and dry.  No rashes NEURO:  Alert and oriented x 3. Cranial nerves II through XII intact. Poor short term memory loss. PSYCH:  Cognitively intact    LABORATORY DATA: Dated 01/05/16: cholesterol 96, triglycerides 78. HDL 36, LDL 44. TSH normal.  Dated 08/09/16: Normal BMET. BNP 8054 Dated 08/14/16: normal chemistry panel. Dated 10/29/16: Normal Hgb and Chemistries  Ecg today shows atrial fibrillation with rate 85 bpm. Nonspecific ST abnormality.  I have personally reviewed and interpreted this study.  Echo 04/09/16: Study Conclusions  - Left ventricle: The cavity size was normal. Wall thickness was   increased in a pattern of mild LVH. Indeterminant diastolic   function (atrial fibrillation). The estimated ejection fraction   was 50%. Mild diffuse hypokinesis. - Aortic valve: Multiple gradients measured and labeled as aortic   valve doppler, numbers vary widely and I cannot tell from where   some of the measurements are being obtained. Aortic valve   interrogation needs to be re-done. - Mitral valve: Mildly calcified annulus. Probably severe   eccentric, anteriorly-directed mitral regurgitation. Suspect   partial flail posterior leaflet (P1/P2 scallops). Not fully    visualized. - Left atrium: The atrium was moderately dilated. - Right ventricle: The cavity size was normal. Systolic function   was mildly reduced. - Right atrium: The atrium was severely dilated. - Tricuspid valve: Peak RV-RA gradient (S): 53 mm Hg. - Pulmonary arteries: PA peak pressure: 68 mm Hg (S). - Systemic veins: IVC measured 2.7 cm with < 50% respirophasic   variation, suggesting RA pressure 15 mmHg.  Impressions:  - Technically difficult study with poor acoustic windows. The   patient was in atrial fibrillation. Normal LV size with mild LV   hypertrophy. EF 50%, diffuse hypokinesis. Suspect severe mitral   regurgitation. Jet not fully visualized due to eccentricity   (anteriorly directed). Valve not fully visualized due to poor   image quality. Suspect partial flail posterior leaflet (P1/P2).   Normal RV size and systolic function. Moderate pulmonary  hypertension. There is at least mild aortic stenosis. The aortic   valve doppler is difficult to interpret and needs to be re-done.   Assessment / Plan: 1. Severe Mitral insufficiency secondary to MV prolapse with partially flail posterior leaflet.   Last Echo January 2018. He is a poor candidate for MV repair or Mitraclip due to advanced age, frailty, and CIDP. His edema is stable. I am reluctant to increase lasix further due to incontinence and risk of dehydrations. Continue support hose and elevation.  2. Atrial fibrillation. Rate is Rodney controlled. Due to history of  falls and declining functional status I feel the risks of anticoagulation outweigh the benefits at this point. Continue ASA. He needs to continue metoprolol for rate control.   3. CIDP progressive decline.  4. Dementia.

## 2017-01-15 ENCOUNTER — Encounter: Payer: Self-pay | Admitting: Cardiology

## 2017-01-15 ENCOUNTER — Ambulatory Visit (INDEPENDENT_AMBULATORY_CARE_PROVIDER_SITE_OTHER): Payer: Medicare Other | Admitting: Cardiology

## 2017-01-15 VITALS — BP 107/62 | HR 85 | Ht 71.75 in | Wt 147.4 lb

## 2017-01-15 DIAGNOSIS — G6181 Chronic inflammatory demyelinating polyneuritis: Secondary | ICD-10-CM | POA: Diagnosis not present

## 2017-01-15 DIAGNOSIS — I340 Nonrheumatic mitral (valve) insufficiency: Secondary | ICD-10-CM

## 2017-01-15 DIAGNOSIS — I482 Chronic atrial fibrillation, unspecified: Secondary | ICD-10-CM

## 2017-01-15 DIAGNOSIS — I5032 Chronic diastolic (congestive) heart failure: Secondary | ICD-10-CM | POA: Diagnosis not present

## 2017-01-15 NOTE — Patient Instructions (Signed)
Continue your current therapy  I will see you in 6 months.   

## 2017-01-21 ENCOUNTER — Ambulatory Visit (INDEPENDENT_AMBULATORY_CARE_PROVIDER_SITE_OTHER): Payer: Medicare Other | Admitting: Podiatry

## 2017-01-21 ENCOUNTER — Encounter: Payer: Self-pay | Admitting: Podiatry

## 2017-01-21 DIAGNOSIS — G6181 Chronic inflammatory demyelinating polyneuritis: Secondary | ICD-10-CM

## 2017-01-21 DIAGNOSIS — M79674 Pain in right toe(s): Secondary | ICD-10-CM | POA: Diagnosis not present

## 2017-01-21 DIAGNOSIS — B351 Tinea unguium: Secondary | ICD-10-CM

## 2017-01-21 DIAGNOSIS — M79675 Pain in left toe(s): Secondary | ICD-10-CM

## 2017-01-21 NOTE — Progress Notes (Signed)
Patient ID: [facility]ney Malone, male   DOB: 23-Apr-1922, 83 y.o.   MRN: [id number]     Subjective: This patient presents again complaining of thick, elongated toenails with direct shoe pressure and walking and is requesting toenail debridement  Objective: Orientated 3 Bilateral AFOs Bilateral peripheral pitting edema DP and PT pulses 2/4 bilaterally Capillary Reflex immediate bilaterally General rubor bilaterally Dorsi flexion and plantar flexion 0/4 bilaterally Ankle reflexes weakly reactive bilaterally Unsteady gait pattern walking with walker Hammertoe 2-4 left No open skin lesions bilaterally Atrophic skin bilaterally The toenails are elongated, brittle, discolored, hypertrophic and tender direct palpation 6-10  Assessment: Symptomatic onychomycoses 6-10 History of chronic inflammatory demyelinating polyneuropathy  Plan: Debridement toenails 10 mechanically and electrically without any bleeding  Reappoint 3 months

## 2017-02-13 DIAGNOSIS — K5904 Chronic idiopathic constipation: Secondary | ICD-10-CM | POA: Diagnosis not present

## 2017-02-13 DIAGNOSIS — I340 Nonrheumatic mitral (valve) insufficiency: Secondary | ICD-10-CM | POA: Diagnosis not present

## 2017-02-13 DIAGNOSIS — I480 Paroxysmal atrial fibrillation: Secondary | ICD-10-CM | POA: Diagnosis not present

## 2017-02-13 DIAGNOSIS — I9589 Other hypotension: Secondary | ICD-10-CM | POA: Diagnosis not present

## 2017-02-13 DIAGNOSIS — I509 Heart failure, unspecified: Secondary | ICD-10-CM | POA: Diagnosis not present

## 2017-02-13 DIAGNOSIS — Z681 Body mass index (BMI) 19 or less, adult: Secondary | ICD-10-CM | POA: Diagnosis not present

## 2017-02-13 DIAGNOSIS — E46 Unspecified protein-calorie malnutrition: Secondary | ICD-10-CM | POA: Diagnosis not present

## 2017-02-13 DIAGNOSIS — Z7189 Other specified counseling: Secondary | ICD-10-CM | POA: Diagnosis not present

## 2017-02-13 DIAGNOSIS — R0600 Dyspnea, unspecified: Secondary | ICD-10-CM | POA: Diagnosis not present

## 2017-02-26 DIAGNOSIS — H61001 Unspecified perichondritis of right external ear: Secondary | ICD-10-CM | POA: Diagnosis not present

## 2017-02-26 DIAGNOSIS — L814 Other melanin hyperpigmentation: Secondary | ICD-10-CM | POA: Diagnosis not present

## 2017-03-25 DIAGNOSIS — H04123 Dry eye syndrome of bilateral lacrimal glands: Secondary | ICD-10-CM | POA: Diagnosis not present

## 2017-03-25 DIAGNOSIS — H5203 Hypermetropia, bilateral: Secondary | ICD-10-CM | POA: Diagnosis not present

## 2017-03-25 DIAGNOSIS — H353132 Nonexudative age-related macular degeneration, bilateral, intermediate dry stage: Secondary | ICD-10-CM | POA: Diagnosis not present

## 2017-03-25 DIAGNOSIS — Z961 Presence of intraocular lens: Secondary | ICD-10-CM | POA: Diagnosis not present

## 2017-03-27 ENCOUNTER — Ambulatory Visit (INDEPENDENT_AMBULATORY_CARE_PROVIDER_SITE_OTHER): Payer: Medicare Other | Admitting: Neurology

## 2017-03-27 ENCOUNTER — Encounter: Payer: Self-pay | Admitting: Neurology

## 2017-03-27 VITALS — BP 110/68 | HR 73 | Ht 71.75 in | Wt 140.4 lb

## 2017-03-27 DIAGNOSIS — R413 Other amnesia: Secondary | ICD-10-CM

## 2017-03-27 DIAGNOSIS — G6181 Chronic inflammatory demyelinating polyneuritis: Secondary | ICD-10-CM

## 2017-03-27 NOTE — Progress Notes (Signed)
NEUROLOGY FOLLOW UP OFFICE NOTE  [facility]one [id number]  DOB: 1922-08-07  HISTORY OF PRESENT ILLNESS: I had the pleasure of seeing [facility] in follow-up in the neurology clinic on 03/27/2017.  The patient was last seen a year ago for CIDP and memory loss. He is accompanied by SNF staff today. Since his last visit, he reports doing well. He had a fall 6 months ago and landed on his left hip. He was brought to the ER, no fracture seen. He reports his memory is bad. He is not driving. He denies any headaches, dizziness, occasional numbness in his hands and feet. No dysarthria/dysphagia, neck pain, bowel/bladder dysfunction. He is now on aspirin for secondary stroke prevention (Coumadin discontinued due to fall risk). At the end of the visit, it appeared he did not recall this provider, he asked if he is seeing Dr. Delice Lesch.   HPI: This is a very pleasant 84 yo LH man with a history of atrial fibrillation on anticoagulation with Coumadin, right frontal stroke in 2004 with no residual deficits, hyperlipidemia, and chronic inflammatory demyelinating polyneuropathy (CIDP) diagnosed around 15 years ago. He recalls symptoms started after he stumbled and broke 2 toes on the left feet, followed by weakness in both feet and legs. He has been wearing AFOs for the past 10 years. He reports numbness and tingling in both feet up to his ankles. He also has numbness and tingling in both hands and has has 4 different surgeries for Dupuytren's contractures. He reports going to [facility] for several years but was told to "just stay in [place] because we can't do anything for you." The last time he did PT was 7 years ago.  Records from [facility] from 2004 to 2011 were reviewed. He was seeing neuromuscular specialist Dr. Roselyn Meier. 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 He was tried on multiple  therapies, including IVIG, plasmapharesis, Cellcept, and steroids without major benefit. They had tried Lyrica and Cymbalta for the dysesthesias but had side effects of nausea. In 2009, he was noted to have a violaceous discoloration in both feet that appeared to be venostasis. The patient does not recall trying the different therapies.  He has been evaluated by our neuromuscular specialist Dr. Posey Pronto for CIDP. Per Dr. Serita Grit note, "he has been clinically stable for 10+ years, suggesting that the disease in in remission. Based on the severity of his neuropathy, there is probably very little demyelinating neuropathy and it has all become axon loss so there is really no nerves left to salvage in the legs." Since there is no progression of symptoms, there is no utility to trying other therapies or doing additional work-up. Dr. Posey Pronto expressed concern about driving and fall risk while on Coumadin. [facility] tells me that after his visit, his children have gotten involved and he has stopped driving. He uses his walker at all times. He denies any headaches.   Diagnostic Data: Head CT done 07/14/13 personally reviewed showing right frontal encephalomalacia, no acute findings.  PAST MEDICAL HISTORY: Past Medical History:  Diagnosis Date  . Allergic rhinitis   . Atrial fibrillation ([place])   . CIDP (chronic inflammatory demyelinating polyneuropathy) ([place])   . Colonic polyp   . H/O: CVA (cerebrovascular accident)   . Hyperbilirubinemia   . Hyperlipidemia   . Long term (current) use of anticoagulants   . Mitral regurgitation   . Nephrolithiasis   . Nevus, atypical   .  Osteopenia   . Recurrent boils   . Unsteady gait     MEDICATIONS: Current Outpatient Medications on File Prior to Visit  Medication Sig Dispense Refill  . acetaminophen (TYLENOL) 325 MG tablet Take 650 mg by mouth every 6 (six) hours as needed.    Marland Kitchen aspirin EC 81 MG tablet Take 1 tablet (81 mg total) by mouth daily. 90 tablet 3  .  cetirizine (ZYRTEC) 10 MG tablet Take 10 mg by mouth as needed.     . diphenhydramine-acetaminophen (TYLENOL PM) 25-500 MG TABS tablet Take 1 tablet by mouth at bedtime as needed.    . furosemide (LASIX) 20 MG tablet Take 20 mg by mouth daily.    Marland Kitchen ketoconazole (NIZORAL) 2 % cream Apply 1 application topically as needed for irritation.    . metoprolol succinate (TOPROL-XL) 100 MG 24 hr tablet Take 1 tablet (100 mg total) by mouth daily. Take with or immediately following a meal. 90 tablet 3   No current facility-administered medications on file prior to visit.     ALLERGIES: Allergies  Allergen Reactions  . Penicillins Other (See Comments)    Has patient had a PCN reaction causing immediate rash, facial/tongue/throat swelling, SOB or lightheadedness with hypotension: no Has patient had a PCN reaction causing severe rash involving mucus membranes or skin necrosis: no Has patient had a PCN reaction that required hospitalization no Has patient had a PCN reaction occurring within the last 10 years: no If all of the above answers are "NO", then may proceed with Cephalosporin use.     FAMILY HISTORY: Family History  Problem Relation Age of Onset  . Colon cancer Neg Hx     SOCIAL HISTORY: Social History   Socioeconomic History  . Marital status: Married    Spouse name: Not on file  . Number of children: 2  . Years of education: Not on file  . Highest education level: Not on file  Social Needs  . Financial resource strain: Not on file  . Food insecurity - worry: Not on file  . Food insecurity - inability: Not on file  . Transportation needs - medical: Not on file  . Transportation needs - non-medical: Not on file  Occupational History  . Occupation: Engineer, site - retired  Tobacco Use  . Smoking status: Former Smoker    Last attempt to quit: 10/06/1973    Years since quitting: 43.5  . Smokeless tobacco: Never Used  Substance and Sexual Activity  . Alcohol use: No    Comment:  usually have a couple of drinks a day  . Drug use: No  . Sexual activity: Not on file  Other Topics Concern  . Not on file  Social History Narrative   Patient is Married([name]). Retired. Lives in  single level home, Independent Living  section at [place] since 2009.   Former smoker, minimal Alcohol history   Patient has Advanced planning documents: Living Will             REVIEW OF SYSTEMS: Constitutional: No fevers, chills, or sweats, no generalized fatigue, change in appetite Eyes: No visual changes, double vision, eye pain Ear, nose and throat: No hearing loss, ear pain, nasal congestion, sore throat Cardiovascular: No chest pain, palpitations Respiratory:  No shortness of breath at rest or with exertion, wheezes GastrointestinaI: No nausea, vomiting, diarrhea, abdominal pain, fecal incontinence Genitourinary:  No dysuria, urinary retention or frequency Musculoskeletal:  No neck pain,+ back pain Integumentary: No rash, pruritus, skin lesions  Neurological: as above Psychiatric: No depression, insomnia, anxiety Endocrine: No palpitations, fatigue, diaphoresis, mood swings, change in appetite, change in weight, increased thirst Hematologic/Lymphatic:  No anemia, purpura, petechiae. Allergic/Immunologic: no itchy/runny eyes, nasal congestion, recent allergic reactions, rashes  PHYSICAL EXAM: Vitals:   03/27/17 1157  BP: 110/68  Pulse: 73  SpO2: 94%   General: No acute distress Head:  Normocephalic/atraumatic Neck: supple, no paraspinal tenderness, full range of motion Heart:  Regular rate and rhythm Lungs:  Clear to auscultation bilaterally Back: No paraspinal tenderness Skin/Extremities: No rash, no edema Neurological Exam: alert and oriented to person, place, and time. No aphasia or dysarthria. Fund of knowledge is appropriate.  Remote memory intact.  Attention and concentration are normal.    Able to name objects and repeat phrases. CDT 4/5  MMSE  - Mini Mental State Exam 03/27/2017 03/27/2016 04/01/2015 08/30/2014 02/24/2014  Orientation to time 4 5 4 4 5   Orientation to Place 4 5 5 5 5   Registration 3 3 3 3 3   Attention/ Calculation 4 5 5 5 5   Recall 1 0 1 3 3   Language- name 2 objects 2 2 2 2 2   Language- repeat 1 1 1 1 1   Language- follow 3 step command 2 2 3 2 3   Language- read & follow direction 1 1 1 1 1   Write a sentence 1 1 1 1 1   Copy design 1 0 1 1 1   Total score 24 25 27 28 30    Cranial nerves: Pupils equal, round, reactive to light.  Extraocular movements intact with no nystagmus. Visual fields full. Facial sensation intact. No facial asymmetry. Tongue, uvula, palate midline.  Motor: Bulk and tone normal, muscle strength 5/5 on both UE, 5/5 proximal bilateral LE (hip flexors, extensors, abduction/adduction), 5/5 bilateral knee flexion/extension, 0/5 bilateral dorsiflexion, plantarflexion, toe extension. (similar to prior visit). No pronator drift.  Sensation decreased in both LE.  Deep tendon reflexes 2+ on both UE, unable to elicit reflexes in both LE. Finger to nose testing intact.  Gait with walker slow and cautious, not wearing AFOs today.  IMPRESSION: This is a very pleasant 84 yo LH man with a history of atrial fibrillation, right frontal stroke with no residual deficits, hyperlipidemia, and CIDP with bilateral foot drop. He has been to [facility] and has been evaluated by our neuromuscular specialist Dr. Posey Pronto as well for the CIDP, at this point he has been clinically stable and is likely in remission. His neurological exam is unchanged. MMSE today is 24/30, similar to prior visit a year ago. No indication to start cholinesterase inhibitors at this point. We again discussed the importance of physical exercise and brain stimulation exercises for brain health. He is not driving. Continue supportive care. He will follow-up in 1 year and knows to call us for any changes.   Thank you for allowing me to participate in his care.  Please  do not hesitate to call for any questions or concerns.  The duration of this appointment visit was 25 minutes of face-to-face time with the patient.  Greater than 50% of this time was spent in counseling, explanation of diagnosis, planning of further management, and coordination of care.   Ellouise Newer, M.D.   CC: Dr. Brigitte Pulse

## 2017-03-27 NOTE — Patient Instructions (Signed)
Great seeing you. Continue with supportive care. Follow-up in 1 year, call for any changes

## 2017-04-03 ENCOUNTER — Encounter: Payer: Self-pay | Admitting: Neurology

## 2017-04-22 ENCOUNTER — Ambulatory Visit: Payer: Medicare Other | Admitting: Podiatry

## 2017-04-25 ENCOUNTER — Encounter: Payer: Self-pay | Admitting: Podiatry

## 2017-04-25 ENCOUNTER — Ambulatory Visit (INDEPENDENT_AMBULATORY_CARE_PROVIDER_SITE_OTHER): Payer: Medicare Other | Admitting: Podiatry

## 2017-04-25 DIAGNOSIS — B351 Tinea unguium: Secondary | ICD-10-CM | POA: Diagnosis not present

## 2017-04-25 DIAGNOSIS — M79675 Pain in left toe(s): Secondary | ICD-10-CM

## 2017-04-25 DIAGNOSIS — M79674 Pain in right toe(s): Secondary | ICD-10-CM

## 2017-04-25 NOTE — Progress Notes (Signed)
Subjective:   Patient ID: [facility]ney [facility], male   DOB: 84 y.o.   MRN: [id number]   HPI Patient presents with thickened irritated nailbeds 1-5 both feet that get painful   ROS      Objective:  Physical Exam  Neurovascular she has no change with red feet which is normal for him with thick brittle nailbeds 1-5 both feet that are very tender with palpation     Assessment:  Mycotic nail infection with pain 1-5 both feet     Plan:  Debrided nailbeds 1-5 both feet with no iatrogenic bleeding noted

## 2017-04-30 ENCOUNTER — Encounter (HOSPITAL_COMMUNITY): Payer: Self-pay | Admitting: Emergency Medicine

## 2017-04-30 ENCOUNTER — Emergency Department (HOSPITAL_COMMUNITY): Payer: Medicare Other

## 2017-04-30 ENCOUNTER — Inpatient Hospital Stay (HOSPITAL_COMMUNITY)
Admission: EM | Admit: 2017-04-30 | Discharge: 2017-05-08 | DRG: 871 | Disposition: A | Discharge status: Skilled Nursing Facility | Payer: Medicare Other | Attending: Internal Medicine | Admitting: Internal Medicine

## 2017-04-30 DIAGNOSIS — I341 Nonrheumatic mitral (valve) prolapse: Secondary | ICD-10-CM | POA: Diagnosis present

## 2017-04-30 DIAGNOSIS — E440 Moderate protein-calorie malnutrition: Secondary | ICD-10-CM | POA: Diagnosis present

## 2017-04-30 DIAGNOSIS — E871 Hypo-osmolality and hyponatremia: Secondary | ICD-10-CM | POA: Diagnosis present

## 2017-04-30 DIAGNOSIS — R740 Nonspecific elevation of levels of transaminase and lactic acid dehydrogenase [LDH]: Secondary | ICD-10-CM | POA: Diagnosis present

## 2017-04-30 DIAGNOSIS — I482 Chronic atrial fibrillation: Secondary | ICD-10-CM | POA: Diagnosis present

## 2017-04-30 DIAGNOSIS — I4891 Unspecified atrial fibrillation: Secondary | ICD-10-CM | POA: Diagnosis present

## 2017-04-30 DIAGNOSIS — Y95 Nosocomial condition: Secondary | ICD-10-CM | POA: Diagnosis present

## 2017-04-30 DIAGNOSIS — L8990 Pressure ulcer of unspecified site, unspecified stage: Secondary | ICD-10-CM

## 2017-04-30 DIAGNOSIS — G6181 Chronic inflammatory demyelinating polyneuritis: Secondary | ICD-10-CM | POA: Diagnosis not present

## 2017-04-30 DIAGNOSIS — Z87891 Personal history of nicotine dependence: Secondary | ICD-10-CM

## 2017-04-30 DIAGNOSIS — R413 Other amnesia: Secondary | ICD-10-CM | POA: Diagnosis present

## 2017-04-30 DIAGNOSIS — B028 Zoster with other complications: Secondary | ICD-10-CM | POA: Diagnosis not present

## 2017-04-30 DIAGNOSIS — G629 Polyneuropathy, unspecified: Secondary | ICD-10-CM | POA: Diagnosis present

## 2017-04-30 DIAGNOSIS — Z7982 Long term (current) use of aspirin: Secondary | ICD-10-CM

## 2017-04-30 DIAGNOSIS — J309 Allergic rhinitis, unspecified: Secondary | ICD-10-CM | POA: Diagnosis present

## 2017-04-30 DIAGNOSIS — Z66 Do not resuscitate: Secondary | ICD-10-CM | POA: Diagnosis present

## 2017-04-30 DIAGNOSIS — R531 Weakness: Secondary | ICD-10-CM | POA: Diagnosis not present

## 2017-04-30 DIAGNOSIS — E785 Hyperlipidemia, unspecified: Secondary | ICD-10-CM | POA: Diagnosis present

## 2017-04-30 DIAGNOSIS — J9601 Acute respiratory failure with hypoxia: Secondary | ICD-10-CM | POA: Diagnosis not present

## 2017-04-30 DIAGNOSIS — F0390 Unspecified dementia without behavioral disturbance: Secondary | ICD-10-CM | POA: Diagnosis present

## 2017-04-30 DIAGNOSIS — J690 Pneumonitis due to inhalation of food and vomit: Secondary | ICD-10-CM | POA: Diagnosis present

## 2017-04-30 DIAGNOSIS — Z8673 Personal history of transient ischemic attack (TIA), and cerebral infarction without residual deficits: Secondary | ICD-10-CM

## 2017-04-30 DIAGNOSIS — Z7189 Other specified counseling: Secondary | ICD-10-CM

## 2017-04-30 DIAGNOSIS — Z79899 Other long term (current) drug therapy: Secondary | ICD-10-CM

## 2017-04-30 DIAGNOSIS — R0600 Dyspnea, unspecified: Secondary | ICD-10-CM

## 2017-04-30 DIAGNOSIS — J90 Pleural effusion, not elsewhere classified: Secondary | ICD-10-CM | POA: Diagnosis not present

## 2017-04-30 DIAGNOSIS — R1310 Dysphagia, unspecified: Secondary | ICD-10-CM | POA: Diagnosis present

## 2017-04-30 DIAGNOSIS — R471 Dysarthria and anarthria: Secondary | ICD-10-CM | POA: Diagnosis present

## 2017-04-30 DIAGNOSIS — A419 Sepsis, unspecified organism: Secondary | ICD-10-CM

## 2017-04-30 DIAGNOSIS — L89151 Pressure ulcer of sacral region, stage 1: Secondary | ICD-10-CM | POA: Diagnosis present

## 2017-04-30 DIAGNOSIS — Z880 Allergy status to penicillin: Secondary | ICD-10-CM

## 2017-04-30 DIAGNOSIS — R0602 Shortness of breath: Secondary | ICD-10-CM

## 2017-04-30 DIAGNOSIS — Z9889 Other specified postprocedural states: Secondary | ICD-10-CM

## 2017-04-30 DIAGNOSIS — Z515 Encounter for palliative care: Secondary | ICD-10-CM

## 2017-04-30 DIAGNOSIS — Z681 Body mass index (BMI) 19 or less, adult: Secondary | ICD-10-CM

## 2017-04-30 DIAGNOSIS — I6789 Other cerebrovascular disease: Secondary | ICD-10-CM | POA: Diagnosis not present

## 2017-04-30 DIAGNOSIS — R29810 Facial weakness: Secondary | ICD-10-CM | POA: Diagnosis not present

## 2017-04-30 DIAGNOSIS — Z86010 Personal history of colonic polyps: Secondary | ICD-10-CM

## 2017-04-30 DIAGNOSIS — I340 Nonrheumatic mitral (valve) insufficiency: Secondary | ICD-10-CM | POA: Diagnosis present

## 2017-04-30 LAB — I-STAT CHEM 8, ED
BUN: 23 mg/dL — ABNORMAL HIGH (ref 6–20)
Calcium, Ion: 1.14 mmol/L — ABNORMAL LOW (ref 1.15–1.40)
Chloride: 103 mmol/L (ref 101–111)
Creatinine, Ser: 0.8 mg/dL (ref 0.61–1.24)
Glucose, Bld: 133 mg/dL — ABNORMAL HIGH (ref 65–99)
HCT: 46 % (ref 39.0–52.0)
HEMOGLOBIN: 15.6 g/dL (ref 13.0–17.0)
POTASSIUM: 4.7 mmol/L (ref 3.5–5.1)
Sodium: 139 mmol/L (ref 135–145)
TCO2: 25 mmol/L (ref 22–32)

## 2017-04-30 LAB — CBC
HEMATOCRIT: 43.8 % (ref 39.0–52.0)
HEMOGLOBIN: 15 g/dL (ref 13.0–17.0)
MCH: 32.4 pg (ref 26.0–34.0)
MCHC: 34.2 g/dL (ref 30.0–36.0)
MCV: 94.6 fL (ref 78.0–100.0)
Platelets: 308 10*3/uL (ref 150–400)
RBC: 4.63 MIL/uL (ref 4.22–5.81)
RDW: 15.8 % — ABNORMAL HIGH (ref 11.5–15.5)
WBC: 14.9 10*3/uL — ABNORMAL HIGH (ref 4.0–10.5)

## 2017-04-30 LAB — DIFFERENTIAL
BASOS ABS: 0 10*3/uL (ref 0.0–0.1)
Basophils Relative: 0 %
EOS PCT: 1 %
Eosinophils Absolute: 0.1 10*3/uL (ref 0.0–0.7)
LYMPHS ABS: 2.2 10*3/uL (ref 0.7–4.0)
Lymphocytes Relative: 15 %
MONOS PCT: 10 %
Monocytes Absolute: 1.5 10*3/uL — ABNORMAL HIGH (ref 0.1–1.0)
Neutro Abs: 11.1 10*3/uL — ABNORMAL HIGH (ref 1.7–7.7)
Neutrophils Relative %: 74 %

## 2017-04-30 LAB — COMPREHENSIVE METABOLIC PANEL
ALBUMIN: 4.1 g/dL (ref 3.5–5.0)
ALT: 20 U/L (ref 17–63)
ANION GAP: 11 (ref 5–15)
AST: 50 U/L — ABNORMAL HIGH (ref 15–41)
Alkaline Phosphatase: 154 U/L — ABNORMAL HIGH (ref 38–126)
BILIRUBIN TOTAL: 2.3 mg/dL — AB (ref 0.3–1.2)
BUN: 19 mg/dL (ref 6–20)
CO2: 22 mmol/L (ref 22–32)
Calcium: 9.8 mg/dL (ref 8.9–10.3)
Chloride: 105 mmol/L (ref 101–111)
Creatinine, Ser: 0.9 mg/dL (ref 0.61–1.24)
GFR calc Af Amer: >60 mL/min (ref >60)
GFR calc non Af Amer: >60 mL/min (ref >60)
GLUCOSE: 144 mg/dL — AB (ref 65–99)
Potassium: 4.8 mmol/L (ref 3.5–5.1)
Sodium: 138 mmol/L (ref 135–145)
TOTAL PROTEIN: 7.5 g/dL (ref 6.5–8.1)

## 2017-04-30 LAB — APTT: APTT: 34 s (ref 24–36)

## 2017-04-30 LAB — I-STAT TROPONIN, ED: Troponin i, poc: 0.02 ng/mL (ref 0.00–0.08)

## 2017-04-30 LAB — I-STAT CG4 LACTIC ACID, ED: Lactic Acid, Venous: 2.35 mmol/L (ref 0.5–1.9)

## 2017-04-30 LAB — CBG MONITORING, ED: Glucose-Capillary: 132 mg/dL — ABNORMAL HIGH (ref 65–99)

## 2017-04-30 LAB — PROTIME-INR
INR: 1.57
Prothrombin Time: 18.6 seconds — ABNORMAL HIGH (ref 11.4–15.2)

## 2017-04-30 MED ORDER — SODIUM CHLORIDE 0.9 % IV SOLN
2.0000 g | Freq: Once | INTRAVENOUS | Status: AC
  Administered 2017-05-01: 2 g via INTRAVENOUS
  Filled 2017-04-30: qty 2

## 2017-04-30 MED ORDER — SODIUM CHLORIDE 0.9 % IV BOLUS (SEPSIS)
1000.0000 mL | Freq: Once | INTRAVENOUS | Status: AC
  Administered 2017-04-30: 1000 mL via INTRAVENOUS

## 2017-04-30 MED ORDER — LEVOFLOXACIN IN D5W 750 MG/150ML IV SOLN
750.0000 mg | Freq: Once | INTRAVENOUS | Status: AC
  Administered 2017-04-30: 750 mg via INTRAVENOUS
  Filled 2017-04-30: qty 150

## 2017-04-30 MED ORDER — VANCOMYCIN HCL IN DEXTROSE 1-5 GM/200ML-% IV SOLN
1000.0000 mg | Freq: Once | INTRAVENOUS | Status: AC
  Administered 2017-05-01: 1000 mg via INTRAVENOUS
  Filled 2017-04-30: qty 200

## 2017-04-30 NOTE — ED Notes (Signed)
Transported to CT scan

## 2017-04-30 NOTE — ED Notes (Signed)
Patient transported to X-ray 

## 2017-04-30 NOTE — ED Notes (Signed)
Dr. Katherine Roan ( neurologist ) cancelled code stroke .

## 2017-04-30 NOTE — ED Notes (Addendum)
Returned from CT scan , placed on a monitor and pulse oximetry , IV site intact , family at bedside / neurologist at bedside evaluating pt and explained plan of care to family .

## 2017-04-30 NOTE — ED Triage Notes (Addendum)
Patient arrived with EMS from [facility] , [facility] 8 pm this evening with slurred speech and right sided weakness . Evaluated by EDP and neurologist at arrival and transported to CT scan . He received Metropolol 5 mg IV by EMS for HR= 180. He has shingles this week at pubis and left upper thigh/groin .

## 2017-04-30 NOTE — ED Notes (Signed)
Returned from CT scan with no distress , family at bedside , NS bolus infusing , IV sites intact .

## 2017-04-30 NOTE — ED Provider Notes (Signed)
[facility] EMERGENCY DEPARTMENT Provider Note   CSN: [id number] Arrival date & time: 04/30/17  2251     History   Chief Complaint Chief Complaint  Patient presents with  . Right Side Weakness    Code Stroke    HPI Rodney Malone is a 84 y.o. male.  The history is provided by the EMS personnel. The history is limited by the condition of the patient.  Weakness  Primary symptoms include focal weakness. Primary symptoms comment: change in mentation. This is a new problem. The current episode started 6 to 12 hours ago. The problem has not changed since onset.Associated symptoms include confusion. Pertinent negatives include no shortness of breath, no chest pain and no headaches. There were no medications administered prior to arrival. Associated medical issues include dementia.    Past Medical History:  Diagnosis Date  . Allergic rhinitis   . Atrial fibrillation ([place])   . CIDP (chronic inflammatory demyelinating polyneuropathy) ([place])   . Colonic polyp   . H/O: CVA (cerebrovascular accident)   . Hyperbilirubinemia   . Hyperlipidemia   . Long term (current) use of anticoagulants   . Mitral regurgitation   . Nephrolithiasis   . Nevus, atypical   . Osteopenia   . Recurrent boils   . Unsteady gait     Patient Active Problem List   Diagnosis Date Noted  . Influenza with pneumonia   . Memory loss 04/05/2015  . Mild closed head injury 07/15/2013  . CIDP (chronic inflammatory demyelinating polyneuropathy) ([place])   . Unsteady gait   . Long term current use of anticoagulant therapy   . Mitral regurgitation due to cusp prolapse   . Atrial fibrillation ([place])   . Hyperlipidemia     Past Surgical History:  Procedure Laterality Date  . APPENDECTOMY         Home Medications    Prior to Admission medications   Medication Sig Start Date End Date Taking? Authorizing Provider  acetaminophen (TYLENOL) 325 MG tablet Take 650 mg by mouth every 6 (six) hours as  needed.    [provider]  aspirin EC 81 MG tablet Take 1 tablet (81 mg total) by mouth daily. 08/14/16   Almyra Deforest, PA  cetirizine (ZYRTEC) 10 MG tablet Take 10 mg by mouth as needed.     [provider]  diphenhydramine-acetaminophen (TYLENOL PM) 25-500 MG TABS tablet Take 1 tablet by mouth at bedtime as needed.    [provider]  furosemide (LASIX) 20 MG tablet Take 20 mg by mouth daily.    [provider]  ketoconazole (NIZORAL) 2 % cream Apply 1 application topically as needed for irritation.    [provider]  metoprolol succinate (TOPROL-XL) 100 MG 24 hr tablet Take 1 tablet (100 mg total) by mouth daily. Take with or immediately following a meal. 09/11/16 12/10/16  Martinique, Peter M, MD    Family History Family History  Problem Relation Age of Onset  . Colon cancer Neg Hx     Social History Social History   Tobacco Use  . Smoking status: Former Smoker    Last attempt to quit: 10/06/1973    Years since quitting: 43.5  . Smokeless tobacco: Never Used  Substance Use Topics  . Alcohol use: No    Comment: usually have a couple of drinks a day  . Drug use: No     Allergies   Penicillins   Review of Systems Review of Systems  Unable to perform  ROS: Mental status change  Respiratory: Negative for shortness of breath.   Cardiovascular: Negative for chest pain.  Neurological: Positive for focal weakness and weakness. Negative for numbness and headaches.  Psychiatric/Behavioral: Positive for confusion.     Physical Exam Updated Vital Signs BP (!) 141/90 (BP Location: Right Arm)   Pulse (!) 132   Temp 99.5 F (37.5 C) (Temporal)   Resp 16   Ht 5\' 11"  (1.803 m)   Wt 63.6 kg (140 lb 3.4 oz)   SpO2 97%   BMI 19.56 kg/m   Physical Exam  Constitutional: He appears well-developed and well-nourished.  HENT:  Head: Normocephalic and atraumatic.  Right Ear: External ear normal.  Left Ear: External ear normal.  Nose: Nose  normal.  Eyes: Conjunctivae are normal. Pupils are equal, round, and reactive to light.  Neck: Normal range of motion. Neck supple. No JVD present.  Cardiovascular: An irregular rhythm present. Tachycardia present.  Pulmonary/Chest: He has decreased breath sounds. He exhibits no tenderness.  Abdominal: Soft. Bowel sounds are normal. He exhibits no mass. There is no tenderness. There is no rebound and no guarding.  Musculoskeletal: He exhibits edema. He exhibits no tenderness.  Lymphadenopathy:    He has no cervical adenopathy.  Neurological: He is alert.  Skin: Skin is warm and dry. Capillary refill takes less than 2 seconds. He is not diaphoretic.  Psychiatric:  unable     ED Treatments / Results  Labs (all labs ordered are listed, but only abnormal results are displayed) Results for orders placed or performed during the hospital encounter of 04/30/17  Protime-INR  Result Value Ref Range   Prothrombin Time 18.6 (H) 11.4 - 15.2 seconds   INR 1.57   APTT  Result Value Ref Range   aPTT 34 24 - 36 seconds  CBC  Result Value Ref Range   WBC 14.9 (H) 4.0 - 10.5 K/uL   RBC 4.63 4.22 - 5.81 MIL/uL   Hemoglobin 15.0 13.0 - 17.0 g/dL   HCT 43.8 39.0 - 52.0 %   MCV 94.6 78.0 - 100.0 fL   MCH 32.4 26.0 - 34.0 pg   MCHC 34.2 30.0 - 36.0 g/dL   RDW 15.8 (H) 11.5 - 15.5 %   Platelets 308 150 - 400 K/uL  Differential  Result Value Ref Range   Neutrophils Relative % PENDING %   Neutro Abs PENDING 1.7 - 7.7 K/uL   Band Neutrophils PENDING %   Lymphocytes Relative PENDING %   Lymphs Abs PENDING 0.7 - 4.0 K/uL   Monocytes Relative PENDING %   Monocytes Absolute PENDING 0.1 - 1.0 K/uL   Eosinophils Relative PENDING %   Eosinophils Absolute PENDING 0.0 - 0.7 K/uL   Basophils Relative PENDING %   Basophils Absolute PENDING 0.0 - 0.1 K/uL   WBC Morphology PENDING    RBC Morphology PENDING    Smear Review PENDING    nRBC PENDING 0 /100 WBC   Metamyelocytes Relative PENDING %    Myelocytes PENDING %   Promyelocytes Absolute PENDING %   Blasts PENDING %  I-stat troponin, ED  Result Value Ref Range   Troponin i, poc 0.02 0.00 - 0.08 ng/mL   Comment 3          CBG monitoring, ED  Result Value Ref Range   Glucose-Capillary 132 (H) 65 - 99 mg/dL  I-Stat Chem 8, ED  Result Value Ref Range   Sodium 139 135 - 145 mmol/L   Potassium 4.7 3.5 -  5.1 mmol/L   Chloride 103 101 - 111 mmol/L   BUN 23 (H) 6 - 20 mg/dL   Creatinine, Ser 0.80 0.61 - 1.24 mg/dL   Glucose, Bld 133 (H) 65 - 99 mg/dL   Calcium, Ion 1.14 (L) 1.15 - 1.40 mmol/L   TCO2 25 22 - 32 mmol/L   Hemoglobin 15.6 13.0 - 17.0 g/dL   HCT 46.0 39.0 - 52.0 %   Ct Head Code Stroke Wo Contrast  Result Date: 04/30/2017 CLINICAL DATA:  Code stroke. RIGHT facial droop, last seen normal at 2000 hours. EXAM: CT HEAD WITHOUT CONTRAST TECHNIQUE: Contiguous axial images were obtained from the base of the skull through the vertex without intravenous contrast. COMPARISON:  CT HEAD February 20, 2015 FINDINGS: BRAIN: No intraparenchymal hemorrhage, mass effect nor midline shift. RIGHT mesial occipital lobe encephalomalacia. Mild ex vacuo dilatation RIGHT frontal horn of lateral ventricle. Moderate to severe parenchymal brain volume loss. No hydrocephalus. Patchy supratentorial white matter hypodensities unchanged. No abnormal extra-axial fluid collections. VASCULAR: Moderate calcific atherosclerosis of the carotid siphons. SKULL: No skull fracture. Severe temporomandibular osteoarthrosis. No significant scalp soft tissue swelling. SINUSES/ORBITS: Trace paranasal sinus mucosal thickening without air-fluid levels. Mastoid air cells are well aerated. Soft tissue within LEFT external auditory canal compatible with cerumen. Included ocular globes and orbital contents are non-suspicious. Status post bilateral ocular lens implants. OTHER: None. ASPECTS [facility] Stroke Program Early CT Score) - Ganglionic level infarction (caudate, lentiform  nuclei, internal capsule, insula, M1-M3 cortex): 7 - Supraganglionic infarction (M4-M6 cortex): 3 Total score (0-10 with 10 being normal): 10 IMPRESSION: 1. No acute intracranial process. 2. ASPECTS is 10. 3. 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 Critical Value/emergent results text paged to [place], Neurology via AMION secure system on 04/30/2017 at 11:05 pm, including interpreting physician's phone number. Electronically Signed   By: Elon Alas M.D.   On: 04/30/2017 23:06    EKG  EKG Interpretation  Date/Time:  Tuesday April 30 2017 23:06:34 EST Ventricular Rate:  125 PR Interval:    QRS Duration: 87 QT Interval:  335 QTC Calculation: 484 R Axis:   0 Text Interpretation:  Sinus tachycardia Premature ventricular complexes RSR' in V1 or V2, right VCD or RVH Nonspecific T abnormalities, inferior leads Confirmed by Randal Buba, Nialah Saravia ([id number]) on 04/30/2017 11:15:01 PM       Radiology Ct Head Code Stroke Wo Contrast  Result Date: 04/30/2017 CLINICAL DATA:  Code stroke. RIGHT facial droop, last seen normal at 2000 hours. EXAM: CT HEAD WITHOUT CONTRAST TECHNIQUE: Contiguous axial images were obtained from the base of the skull through the vertex without intravenous contrast. COMPARISON:  CT HEAD February 20, 2015 FINDINGS: BRAIN: No intraparenchymal hemorrhage, mass effect nor midline shift. RIGHT mesial occipital lobe encephalomalacia. Mild ex vacuo dilatation RIGHT frontal horn of lateral ventricle. Moderate to severe parenchymal brain volume loss. No hydrocephalus. Patchy supratentorial white matter hypodensities unchanged. No abnormal extra-axial fluid collections. VASCULAR: Moderate calcific atherosclerosis of the carotid siphons. SKULL: No skull fracture. Severe temporomandibular osteoarthrosis. No significant scalp soft tissue swelling. SINUSES/ORBITS: Trace paranasal sinus mucosal thickening without air-fluid  levels. Mastoid air cells are well aerated. Soft tissue within LEFT external auditory canal compatible with cerumen. Included ocular globes and orbital contents are non-suspicious. Status post bilateral ocular lens implants. OTHER: None. ASPECTS [facility] Stroke Program Early CT Score) - Ganglionic level infarction (caudate, lentiform nuclei, internal capsule, insula, M1-M3 cortex): 7 - Supraganglionic infarction (M4-M6 cortex): 3 Total score (  0-10 with 10 being normal): 10 IMPRESSION: 1. No acute intracranial process. 2. ASPECTS is 10. 3. 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 Critical Value/emergent results text paged to [place], Neurology via AMION secure system on 04/30/2017 at 11:05 pm, including interpreting physician's phone number. Electronically Signed   By: Elon Alas M.D.   On: 04/30/2017 23:06    Procedures Procedures (including critical care time)  Medications Ordered in ED Medications  levofloxacin (LEVAQUIN) IVPB 750 mg (not administered)  aztreonam (AZACTAM) 2 g in sodium chloride 0.9 % 100 mL IVPB (not administered)  vancomycin (VANCOCIN) IVPB 1000 mg/200 mL premix (not administered)  sodium chloride 0.9 % bolus 1,000 mL (not administered)     Give low EF and patient is already on Lasix and has an O2 requirement it is imprudent to give 30 cc/kg bolus as this will put patient into CHF.    IV abx started immediately per protocol    MDM Reviewed: previous chart, nursing note and vitals Interpretation: labs, ECG, CT scan and x-ray (normal creatinine and troponin.  No mass or shift on CT head by me.  PNA on CXR) Total time providing critical care: 30-74 minutes. This excludes time spent performing separately reportable procedures and services. Consults: admitting MD and neurology  CRITICAL CARE Performed by: Carlisle Beers Total critical care time: 60 minutes Critical care time was  exclusive of separately billable procedures and treating other patients. Critical care was necessary to treat or prevent imminent or life-threatening deterioration. Critical care was time spent personally by me on the following activities: development of treatment plan with patient and/or surrogate as well as nursing, discussions with consultants, evaluation of patient's response to treatment, examination of patient, obtaining history from patient or surrogate, ordering and performing treatments and interventions, ordering and review of laboratory studies, ordering and review of radiographic studies, pulse oximetry and re-evaluation of patient's condition. Final Clinical Impressions(s) / ED Diagnoses   Final diagnoses:  Herpes zoster with other complication  Sepsis, due to unspecified organism ([place])  Weakness    Admit to medicine, given weakness will still need MRI per Dr. Jeri Lager, Kebra [place]rim[place], MD 05/01/17 0093

## 2017-05-01 ENCOUNTER — Encounter (HOSPITAL_COMMUNITY): Payer: Self-pay | Admitting: Emergency Medicine

## 2017-05-01 ENCOUNTER — Other Ambulatory Visit: Payer: Self-pay

## 2017-05-01 DIAGNOSIS — R1310 Dysphagia, unspecified: Secondary | ICD-10-CM | POA: Diagnosis not present

## 2017-05-01 DIAGNOSIS — R531 Weakness: Secondary | ICD-10-CM

## 2017-05-01 DIAGNOSIS — R0603 Acute respiratory distress: Secondary | ICD-10-CM | POA: Diagnosis not present

## 2017-05-01 DIAGNOSIS — I051 Rheumatic mitral insufficiency: Secondary | ICD-10-CM | POA: Diagnosis not present

## 2017-05-01 DIAGNOSIS — J690 Pneumonitis due to inhalation of food and vomit: Secondary | ICD-10-CM | POA: Diagnosis not present

## 2017-05-01 DIAGNOSIS — Z9889 Other specified postprocedural states: Secondary | ICD-10-CM | POA: Diagnosis not present

## 2017-05-01 DIAGNOSIS — J9811 Atelectasis: Secondary | ICD-10-CM | POA: Diagnosis not present

## 2017-05-01 DIAGNOSIS — Z7982 Long term (current) use of aspirin: Secondary | ICD-10-CM | POA: Diagnosis not present

## 2017-05-01 DIAGNOSIS — Z86010 Personal history of colonic polyps: Secondary | ICD-10-CM | POA: Diagnosis not present

## 2017-05-01 DIAGNOSIS — G6181 Chronic inflammatory demyelinating polyneuritis: Secondary | ICD-10-CM | POA: Diagnosis not present

## 2017-05-01 DIAGNOSIS — J80 Acute respiratory distress syndrome: Secondary | ICD-10-CM | POA: Diagnosis not present

## 2017-05-01 DIAGNOSIS — J90 Pleural effusion, not elsewhere classified: Secondary | ICD-10-CM | POA: Diagnosis not present

## 2017-05-01 DIAGNOSIS — E871 Hypo-osmolality and hyponatremia: Secondary | ICD-10-CM | POA: Diagnosis present

## 2017-05-01 DIAGNOSIS — Z7189 Other specified counseling: Secondary | ICD-10-CM | POA: Diagnosis not present

## 2017-05-01 DIAGNOSIS — L8990 Pressure ulcer of unspecified site, unspecified stage: Secondary | ICD-10-CM

## 2017-05-01 DIAGNOSIS — R5381 Other malaise: Secondary | ICD-10-CM | POA: Diagnosis not present

## 2017-05-01 DIAGNOSIS — A419 Sepsis, unspecified organism: Secondary | ICD-10-CM | POA: Diagnosis not present

## 2017-05-01 DIAGNOSIS — Z681 Body mass index (BMI) 19 or less, adult: Secondary | ICD-10-CM | POA: Diagnosis not present

## 2017-05-01 DIAGNOSIS — Y95 Nosocomial condition: Secondary | ICD-10-CM | POA: Diagnosis present

## 2017-05-01 DIAGNOSIS — Z515 Encounter for palliative care: Secondary | ICD-10-CM | POA: Diagnosis not present

## 2017-05-01 DIAGNOSIS — L89151 Pressure ulcer of sacral region, stage 1: Secondary | ICD-10-CM | POA: Diagnosis present

## 2017-05-01 DIAGNOSIS — Z8673 Personal history of transient ischemic attack (TIA), and cerebral infarction without residual deficits: Secondary | ICD-10-CM | POA: Diagnosis not present

## 2017-05-01 DIAGNOSIS — E440 Moderate protein-calorie malnutrition: Secondary | ICD-10-CM | POA: Diagnosis present

## 2017-05-01 DIAGNOSIS — J9601 Acute respiratory failure with hypoxia: Secondary | ICD-10-CM | POA: Diagnosis present

## 2017-05-01 DIAGNOSIS — R413 Other amnesia: Secondary | ICD-10-CM | POA: Diagnosis not present

## 2017-05-01 DIAGNOSIS — R740 Nonspecific elevation of levels of transaminase and lactic acid dehydrogenase [LDH]: Secondary | ICD-10-CM | POA: Diagnosis present

## 2017-05-01 DIAGNOSIS — F0390 Unspecified dementia without behavioral disturbance: Secondary | ICD-10-CM | POA: Diagnosis present

## 2017-05-01 DIAGNOSIS — R4182 Altered mental status, unspecified: Secondary | ICD-10-CM | POA: Diagnosis not present

## 2017-05-01 DIAGNOSIS — G629 Polyneuropathy, unspecified: Secondary | ICD-10-CM | POA: Diagnosis present

## 2017-05-01 DIAGNOSIS — Z79899 Other long term (current) drug therapy: Secondary | ICD-10-CM | POA: Diagnosis not present

## 2017-05-01 DIAGNOSIS — J309 Allergic rhinitis, unspecified: Secondary | ICD-10-CM | POA: Diagnosis present

## 2017-05-01 DIAGNOSIS — Z66 Do not resuscitate: Secondary | ICD-10-CM | POA: Diagnosis not present

## 2017-05-01 DIAGNOSIS — J189 Pneumonia, unspecified organism: Secondary | ICD-10-CM | POA: Diagnosis not present

## 2017-05-01 DIAGNOSIS — I4891 Unspecified atrial fibrillation: Secondary | ICD-10-CM

## 2017-05-01 DIAGNOSIS — E785 Hyperlipidemia, unspecified: Secondary | ICD-10-CM | POA: Diagnosis present

## 2017-05-01 DIAGNOSIS — B028 Zoster with other complications: Secondary | ICD-10-CM | POA: Diagnosis not present

## 2017-05-01 DIAGNOSIS — I340 Nonrheumatic mitral (valve) insufficiency: Secondary | ICD-10-CM | POA: Diagnosis present

## 2017-05-01 DIAGNOSIS — R0602 Shortness of breath: Secondary | ICD-10-CM | POA: Diagnosis not present

## 2017-05-01 DIAGNOSIS — I482 Chronic atrial fibrillation: Secondary | ICD-10-CM | POA: Diagnosis not present

## 2017-05-01 DIAGNOSIS — R471 Dysarthria and anarthria: Secondary | ICD-10-CM | POA: Diagnosis present

## 2017-05-01 DIAGNOSIS — R42 Dizziness and giddiness: Secondary | ICD-10-CM | POA: Diagnosis not present

## 2017-05-01 LAB — COMPREHENSIVE METABOLIC PANEL
ALT: 23 U/L (ref 17–63)
ANION GAP: 9 (ref 5–15)
AST: 58 U/L — ABNORMAL HIGH (ref 15–41)
Albumin: 3.4 g/dL — ABNORMAL LOW (ref 3.5–5.0)
Alkaline Phosphatase: 134 U/L — ABNORMAL HIGH (ref 38–126)
BILIRUBIN TOTAL: 2 mg/dL — AB (ref 0.3–1.2)
BUN: 19 mg/dL (ref 6–20)
CALCIUM: 8.9 mg/dL (ref 8.9–10.3)
CO2: 20 mmol/L — ABNORMAL LOW (ref 22–32)
Chloride: 105 mmol/L (ref 101–111)
Creatinine, Ser: 0.81 mg/dL (ref 0.61–1.24)
GLUCOSE: 135 mg/dL — AB (ref 65–99)
Potassium: 4.3 mmol/L (ref 3.5–5.1)
Sodium: 134 mmol/L — ABNORMAL LOW (ref 135–145)
TOTAL PROTEIN: 6.2 g/dL — AB (ref 6.5–8.1)

## 2017-05-01 LAB — CBC WITH DIFFERENTIAL/PLATELET
BLASTS: 0 %
Band Neutrophils: 3 %
Basophils Absolute: 0 10*3/uL (ref 0.0–0.1)
Basophils Relative: 0 %
Eosinophils Absolute: 0 10*3/uL (ref 0.0–0.7)
Eosinophils Relative: 0 %
HEMATOCRIT: 39.9 % (ref 39.0–52.0)
HEMOGLOBIN: 13.1 g/dL (ref 13.0–17.0)
LYMPHS PCT: 16 %
Lymphs Abs: 2.1 10*3/uL (ref 0.7–4.0)
MCH: 30.9 pg (ref 26.0–34.0)
MCHC: 32.8 g/dL (ref 30.0–36.0)
MCV: 94.1 fL (ref 78.0–100.0)
MONOS PCT: 12 %
Metamyelocytes Relative: 0 %
Monocytes Absolute: 1.6 10*3/uL — ABNORMAL HIGH (ref 0.1–1.0)
Myelocytes: 0 %
NEUTROS PCT: 69 %
NRBC: 0 /100{WBCs}
Neutro Abs: 9.3 10*3/uL — ABNORMAL HIGH (ref 1.7–7.7)
OTHER: 0 %
PROMYELOCYTES ABS: 0 %
Platelets: 288 10*3/uL (ref 150–400)
RBC: 4.24 MIL/uL (ref 4.22–5.81)
RDW: 16.1 % — ABNORMAL HIGH (ref 11.5–15.5)
WBC: 13 10*3/uL — ABNORMAL HIGH (ref 4.0–10.5)

## 2017-05-01 LAB — PROCALCITONIN: Procalcitonin: 0.34 ng/mL

## 2017-05-01 LAB — URINALYSIS, ROUTINE W REFLEX MICROSCOPIC
Bacteria, UA: NONE SEEN
Bilirubin Urine: NEGATIVE
GLUCOSE, UA: NEGATIVE mg/dL
HGB URINE DIPSTICK: NEGATIVE
Ketones, ur: NEGATIVE mg/dL
Leukocytes, UA: NEGATIVE
NITRITE: NEGATIVE
PH: 5 (ref 5.0–8.0)
PROTEIN: 30 mg/dL — AB
SPECIFIC GRAVITY, URINE: 1.024 (ref 1.005–1.030)
Squamous Epithelial / LPF: NONE SEEN

## 2017-05-01 LAB — INFLUENZA PANEL BY PCR (TYPE A & B)
INFLAPCR: NEGATIVE
INFLBPCR: NEGATIVE

## 2017-05-01 LAB — TSH: TSH: 1.956 u[IU]/mL (ref 0.350–4.500)

## 2017-05-01 LAB — CBC
HCT: 39.5 % (ref 39.0–52.0)
HEMOGLOBIN: 13.2 g/dL (ref 13.0–17.0)
MCH: 31.4 pg (ref 26.0–34.0)
MCHC: 33.4 g/dL (ref 30.0–36.0)
MCV: 93.8 fL (ref 78.0–100.0)
Platelets: 290 10*3/uL (ref 150–400)
RBC: 4.21 MIL/uL — ABNORMAL LOW (ref 4.22–5.81)
RDW: 15.7 % — ABNORMAL HIGH (ref 11.5–15.5)
WBC: 13.3 10*3/uL — ABNORMAL HIGH (ref 4.0–10.5)

## 2017-05-01 LAB — HIV ANTIBODY (ROUTINE TESTING W REFLEX): HIV Screen 4th Generation wRfx: NONREACTIVE

## 2017-05-01 LAB — TROPONIN I: Troponin I: 0.03 ng/mL (ref <0.03)

## 2017-05-01 LAB — MAGNESIUM: Magnesium: 1.8 mg/dL (ref 1.7–2.4)

## 2017-05-01 LAB — LACTIC ACID, PLASMA
LACTIC ACID, VENOUS: 1.7 mmol/L (ref 0.5–1.9)
Lactic Acid, Venous: 1.9 mmol/L (ref 0.5–1.9)

## 2017-05-01 LAB — CREATININE, SERUM
CREATININE: 0.81 mg/dL (ref 0.61–1.24)
GFR calc Af Amer: >60 mL/min (ref >60)
GFR calc non Af Amer: >60 mL/min (ref >60)

## 2017-05-01 LAB — CK: Total CK: 186 U/L (ref 49–397)

## 2017-05-01 LAB — STREP PNEUMONIAE URINARY ANTIGEN: Strep Pneumo Urinary Antigen: NEGATIVE

## 2017-05-01 LAB — APTT: aPTT: 38 seconds — ABNORMAL HIGH (ref 24–36)

## 2017-05-01 MED ORDER — WHITE PETROLATUM EX OINT
TOPICAL_OINTMENT | CUTANEOUS | Status: DC | PRN
  Administered 2017-05-01: 14:00:00 via TOPICAL
  Filled 2017-05-01: qty 28.35

## 2017-05-01 MED ORDER — ACETAMINOPHEN 325 MG PO TABS
650.0000 mg | ORAL_TABLET | Freq: Four times a day (QID) | ORAL | Status: DC | PRN
  Administered 2017-05-07: 650 mg via ORAL
  Filled 2017-05-01: qty 2

## 2017-05-01 MED ORDER — LORAZEPAM 2 MG/ML IJ SOLN
1.0000 mg | Freq: Once | INTRAMUSCULAR | Status: AC
  Administered 2017-05-01: 1 mg via INTRAVENOUS
  Filled 2017-05-01: qty 1

## 2017-05-01 MED ORDER — VANCOMYCIN HCL IN DEXTROSE 750-5 MG/150ML-% IV SOLN
750.0000 mg | INTRAVENOUS | Status: DC
  Administered 2017-05-02 – 2017-05-04 (×3): 750 mg via INTRAVENOUS
  Filled 2017-05-01 (×3): qty 150

## 2017-05-01 MED ORDER — METOPROLOL SUCCINATE ER 100 MG PO TB24
100.0000 mg | ORAL_TABLET | Freq: Every day | ORAL | Status: DC
  Administered 2017-05-01 – 2017-05-08 (×9): 100 mg via ORAL
  Filled 2017-05-01 (×10): qty 1

## 2017-05-01 MED ORDER — AZTREONAM 1 G IJ SOLR
1.0000 g | Freq: Three times a day (TID) | INTRAMUSCULAR | Status: DC
Start: 2017-05-01 — End: 2017-05-02
  Administered 2017-05-01 – 2017-05-02 (×3): 1 g via INTRAVENOUS
  Filled 2017-05-01 (×6): qty 1

## 2017-05-01 MED ORDER — ASPIRIN EC 81 MG PO TBEC
81.0000 mg | DELAYED_RELEASE_TABLET | Freq: Every day | ORAL | Status: DC
  Administered 2017-05-01 – 2017-05-08 (×8): 81 mg via ORAL
  Filled 2017-05-01 (×9): qty 1

## 2017-05-01 MED ORDER — SODIUM CHLORIDE 0.9 % IV SOLN
INTRAVENOUS | Status: AC
  Administered 2017-05-01: 01:00:00 via INTRAVENOUS
  Administered 2017-05-01: 75 mL/h via INTRAVENOUS

## 2017-05-01 MED ORDER — ENOXAPARIN SODIUM 40 MG/0.4ML ~~LOC~~ SOLN
40.0000 mg | SUBCUTANEOUS | Status: DC
  Administered 2017-05-01 – 2017-05-08 (×8): 40 mg via SUBCUTANEOUS
  Filled 2017-05-01 (×9): qty 0.4

## 2017-05-01 MED ORDER — LEVOFLOXACIN IN D5W 500 MG/100ML IV SOLN
500.0000 mg | INTRAVENOUS | Status: DC
  Administered 2017-05-01: 500 mg via INTRAVENOUS
  Filled 2017-05-01: qty 100

## 2017-05-01 MED ORDER — ONDANSETRON HCL 4 MG/2ML IJ SOLN: 4.0000 mg | Freq: Four times a day (QID) | INTRAMUSCULAR | Status: DC | PRN

## 2017-05-01 MED ORDER — VALACYCLOVIR HCL 500 MG PO TABS
1000.0000 mg | ORAL_TABLET | Freq: Three times a day (TID) | ORAL | Status: AC
  Administered 2017-05-01 – 2017-05-07 (×21): 1000 mg via ORAL
  Filled 2017-05-01 (×23): qty 2

## 2017-05-01 MED ORDER — ACETAMINOPHEN 650 MG RE SUPP: 650.0000 mg | Freq: Four times a day (QID) | RECTAL | Status: DC | PRN

## 2017-05-01 MED ORDER — ONDANSETRON HCL 4 MG PO TABS: 4.0000 mg | ORAL_TABLET | Freq: Four times a day (QID) | ORAL | Status: DC | PRN

## 2017-05-01 NOTE — ED Notes (Signed)
Dr. Randal Buba notified on pt.'s persistent tachycardia .

## 2017-05-01 NOTE — Consult Note (Signed)
Neurology Consultation Reason for Consult: Difficulty getting up Referring Physician: Nicholes Stairs, a  CC: generalized weakness, wife  History is obtained from: Patient  HPI: Rodney Malone is a 84 y.o. male with a history of neuropathy as well as atrial fibrillation not currently on anticoagulation who presents with difficulty getting to the bathroom.  Typically, he is able to use his walker to get to the bathroom alone, however he has been getting progressively worse as time goes on.  Tonight, he awoke and was trying to get out of bed, and his wife realized that he was having difficulty, she states that it did not seem to be lateralized, but she was able to help him get to the bathroom with much difficulty in a wheelchair.  She is having continued difficulty and therefore called the nurse.  Due to him having more difficulty than typical, they checked his vitals and found that he was in RVR.  He also had a O2 sat of 86% on EMS arrival.  He states that he did feel lightheaded during this time.  Also, EMS noted that he was febrile in route.  He is currently suffering from a she will flare.   There was some question of right-sided facial droop on EMS arrival as well as difficulty speaking, but it is unclear to me if this was primarily due to dry mouth/lightheadedness.  ROS: A 14 point ROS was performed and is negative except as noted in the HPI.  Past Medical History:  Diagnosis Date  . Allergic rhinitis   . Atrial fibrillation ([place])   . CIDP (chronic inflammatory demyelinating polyneuropathy) ([place])   . Colonic polyp   . H/O: CVA (cerebrovascular accident)   . Hyperbilirubinemia   . Hyperlipidemia   . Long term (current) use of anticoagulants   . Mitral regurgitation   . Nephrolithiasis   . Nevus, atypical   . Osteopenia   . Recurrent boils   . Unsteady gait      Family History  Problem Relation Age of Onset  . Colon cancer Neg Hx      Social History:  reports that he quit smoking  about 43 years ago. he has never used smokeless tobacco. He reports that he does not drink alcohol or use drugs.   Exam: Current vital signs: BP 128/82   Pulse (!) 110   Temp 99.2 F (37.3 C)   Resp (!) 27   Ht 5\' 11"  (1.803 m)   Wt 63.6 kg (140 lb 3.4 oz)   SpO2 96%   BMI 19.56 kg/m  Vital signs in last 24 hours: Temp:  [99.2 F (37.3 C)-99.5 F (37.5 C)] 99.2 F (37.3 C) (02/19 2357) Pulse Rate:  [55-132] 110 (02/19 2330) Resp:  [16-30] 27 (02/19 2330) BP: (102-141)/(75-90) 128/82 (02/19 2330) SpO2:  [96 %-100 %] 96 % (02/19 2330) Weight:  [63.3 kg (139 lb 8.8 oz)-63.6 kg (140 lb 3.4 oz)] 63.6 kg (140 lb 3.4 oz) (02/19 2307)   Physical Exam  Constitutional: Appears well-developed and well-nourished.  Psych: Affect appropriate to situation Eyes: No scleral injection HENT: Dry mucous membranes Head: Normocephalic.  Cardiovascular: Normal rate and regular rhythm.  Respiratory: Effort normal, non-labored breathing GI: Soft.  No distension. There is no tenderness.  Skin: WDI  Neuro: Mental Status: Patient is awake, alert, oriented to person, place, month, year, and situation. Patient is able to give a clear and coherent history. No signs of aphasia or neglect He is mildly dysarthric  cranial Nerves: II: Visual  Fields are full. Pupils are equal, round, and reactive to light.   III,IV, VI: EOMI without ptosis or diploplia.  V: Facial sensation is symmetric to temperature VII: Facial movement is symmetric.  VIII: hearing is intact to voice X: Uvula elevates symmetrically XI: Shoulder shrug is symmetric. XII: tongue is midline without atrophy or fasciculations.  Motor: He is tremulous, but has good strength in bilateral upper extremities and proximal lower extremities, he has severe weakness of ankle plantar/dorsiflexion bilaterally. Sensory: Sensation is diminished distally but symmetric Deep Tendon Reflexes: 2+ and symmetric in the biceps and absent at patellae.   Cerebellar: No clear ataxia on finger-nose-finger, though he is tremulous.   I have reviewed labs in epic and the results pertinent to this consultation are: CMP-unremarkable CBC-leukocytosis  I have reviewed the images obtained: CT head is negative  Impression: 84 year old male with generalized weakness in the setting of fever/leukocytosis.  At this point, I think that his current presentation is due to general medical condition and I have low suspicion for stroke or other primary neurological cause.  That being said with his history of atrial fibrillation without anticoagulation and the question of facial droop, I think an MRI would be reasonable, but if this is negative then I would not pursue any further neurological testing.  I suspect his dysarthria is primarily dry mouth.  Recommendations: 1) treatment of underlying medical issues 2) MRI brain without contrast   Roland Rack, MD [facility] [phone]  If 7pm- 7am, please page neurology on call as listed in [place].

## 2017-05-01 NOTE — H&P (Addendum)
History and Physical    Rodney Malone IEP:[id number] DOB: 1922-05-08 DOA: 04/30/2017  PCP: [name], MD  Patient coming from: Assisted living facility.  Chief Complaint: Weakness.  HPI: Rodney Malone is a 84 y.o. male with history of atrial fibrillation not on anticoagulation secondary to risk of bleed, severe mitral regurgitation, history of CIDP was brought to the ER after patient was found to be weak and dizzy.  As per the patient's daughter patient usually uses a walker but today found very weak and had to be taken to the bathroom with help.  After going to the bathroom patient found it difficult to get up and EMS was called and was brought to the ER.  EMS also noticed that patient was mildly febrile.  Patient also was diagnosed with shingles 2 days ago and was started on Valtrex.  ED Course: In the ER patient appeared nonfocal but generally weak.  CT head is unremarkable.  Neurology was consulted and at this time neurology felt that patient did not have acute stroke.  However since patient has history of atrial fibrillation to get MRI brain to rule out.  Patient was found to be mildly febrile with leukocytosis and elevated lactate level.  Chest x-ray shows possible infiltrates.  Patient was started on empiric antibiotics and admitted for possible developing sepsis.  Review of Systems: As per HPI, rest all negative.   Past Medical History:  Diagnosis Date  . Allergic rhinitis   . Atrial fibrillation ([place])   . CIDP (chronic inflammatory demyelinating polyneuropathy) ([place])   . Colonic polyp   . H/O: CVA (cerebrovascular accident)   . Hyperbilirubinemia   . Hyperlipidemia   . Long term (current) use of anticoagulants   . Mitral regurgitation   . Nephrolithiasis   . Nevus, atypical   . Osteopenia   . Recurrent boils   . Unsteady gait     Past Surgical History:  Procedure Laterality Date  . APPENDECTOMY       reports that he quit smoking about 43 years ago. he has never  used smokeless tobacco. He reports that he does not drink alcohol or use drugs.  Allergies  Allergen Reactions  . Penicillins Other (See Comments)    Has patient had a PCN reaction causing immediate rash, facial/tongue/throat swelling, SOB or lightheadedness with hypotension: no Has patient had a PCN reaction causing severe rash involving mucus membranes or skin necrosis: no Has patient had a PCN reaction that required hospitalization no Has patient had a PCN reaction occurring within the last 10 years: no If all of the above answers are "NO", then may proceed with Cephalosporin use.     Family History  Problem Relation Age of Onset  . Colon cancer Neg Hx     Prior to Admission medications   Medication Sig Start Date End Date Taking? Authorizing Provider  acetaminophen (TYLENOL) 325 MG tablet Take 650 mg by mouth every 6 (six) hours as needed.    [provider]  aspirin EC 81 MG tablet Take 1 tablet (81 mg total) by mouth daily. 08/14/16   Almyra Deforest, PA  cetirizine (ZYRTEC) 10 MG tablet Take 10 mg by mouth as needed.     [provider]  diphenhydramine-acetaminophen (TYLENOL PM) 25-500 MG TABS tablet Take 1 tablet by mouth at bedtime as needed.    [provider]  furosemide (LASIX) 20 MG tablet Take 20 mg by mouth daily.    [provider]  ketoconazole (NIZORAL) 2 %  cream Apply 1 application topically as needed for irritation.    [provider]  metoprolol succinate (TOPROL-XL) 100 MG 24 hr tablet Take 1 tablet (100 mg total) by mouth daily. Take with or immediately following a meal. 09/11/16 12/10/16  [place], Peter M, MD    Physical Exam: Vitals:   04/30/17 2315 04/30/17 2330 04/30/17 2357 05/01/17 0030  BP: 102/75 128/82  120/78  Pulse: (!) 55 (!) 110  (!) 143  Resp: (!) 30 (!) 27  (!) 23  Temp:   99.2 F (37.3 C)   TempSrc:      SpO2: 100% 96%  91%  Weight:      Height:          Constitutional: Moderately built and  nourished. Vitals:   04/30/17 2315 04/30/17 2330 04/30/17 2357 05/01/17 0030  BP: 102/75 128/82  120/78  Pulse: (!) 55 (!) 110  (!) 143  Resp: (!) 30 (!) 27  (!) 23  Temp:   99.2 F (37.3 C)   TempSrc:      SpO2: 100% 96%  91%  Weight:      Height:       Eyes: Anicteric no pallor. ENMT: No discharge from the ears eyes nose or mouth. Neck: No mass palpated no JVD appreciated. Respiratory: No rhonchi or crepitations. Cardiovascular: S1-S2 heard no murmurs appreciated. Abdomen: Soft nontender bowel sounds present. Musculoskeletal: Chronic lower extremity edema. Skin: Chronic skin changes.  Patient has groin rash on the left side.  Typical of shingles. Neurologic: Alert awake oriented to time place and person.  Moves all extremities.  Has generalized weakness.  No facial asymmetry.  Tongue is midline.  Pupils equal and reacting to light. Psychiatric: Appears normal.   Labs on Admission: I have personally reviewed following labs and imaging studies  CBC: Recent Labs  Lab 04/30/17 2247 04/30/17 2257  WBC 14.9*  --   NEUTROABS 11.1*  --   HGB 15.0 15.6  HCT 43.8 46.0  MCV 94.6  --   PLT 308  --    Basic Metabolic Panel: Recent Labs  Lab 04/30/17 2247 04/30/17 2257  NA 138 139  K 4.8 4.7  CL 105 103  CO2 22  --   GLUCOSE 144* 133*  BUN 19 23*  CREATININE 0.90 0.80  CALCIUM 9.8  --    GFR: Estimated Creatinine Clearance: 53 mL/min (by C-G formula based on SCr of 0.8 mg/dL). Liver Function Tests: Recent Labs  Lab 04/30/17 2247  AST 50*  ALT 20  ALKPHOS 154*  BILITOT 2.3*  PROT 7.5  ALBUMIN 4.1   No results for input(s): LIPASE, AMYLASE in the last 168 hours. No results for input(s): AMMONIA in the last 168 hours. Coagulation Profile: Recent Labs  Lab 04/30/17 2247  INR 1.57   Cardiac Enzymes: No results for input(s): CKTOTAL, CKMB, CKMBINDEX, TROPONINI in the last 168 hours. BNP (last 3 results) No results for input(s): PROBNP in the last 8760  hours. HbA1C: No results for input(s): HGBA1C in the last 72 hours. CBG: Recent Labs  Lab 04/30/17 2251  GLUCAP 132*   Lipid Profile: No results for input(s): CHOL, HDL, LDLCALC, TRIG, CHOLHDL, LDLDIRECT in the last 72 hours. Thyroid Function Tests: No results for input(s): TSH, T4TOTAL, FREET4, T3FREE, THYROIDAB in the last 72 hours. Anemia Panel: No results for input(s): VITAMINB12, FOLATE, FERRITIN, TIBC, IRON, RETICCTPCT in the last 72 hours. Urine analysis:    Component Value Date/Time   COLORURINE YELLOW 05/01/2017 0000  APPEARANCEUR CLEAR 05/01/2017 0000   LABSPEC 1.024 05/01/2017 0000   PHURINE 5.0 05/01/2017 0000   GLUCOSEU NEGATIVE 05/01/2017 0000   HGBUR NEGATIVE 05/01/2017 0000   BILIRUBINUR NEGATIVE 05/01/2017 0000   KETONESUR NEGATIVE 05/01/2017 0000   PROTEINUR 30 (A) 05/01/2017 0000   UROBILINOGEN 0.2 08/09/2008 1949   NITRITE NEGATIVE 05/01/2017 0000   LEUKOCYTESUR NEGATIVE 05/01/2017 0000   Sepsis Labs: @LABRCNTIP (procalcitonin:4,lacticidven:4) )No results found for this or any previous visit (from the past 240 hour(s)).   Radiological Exams on Admission: Dg Chest 2 View  Result Date: 04/30/2017 CLINICAL DATA:  Slurred speech EXAM: CHEST  2 VIEW COMPARISON:  04/08/2016 FINDINGS: Small right-sided pleural effusion with right basilar and middle lobe atelectasis or pneumonia. Streaky atelectasis or scarring at the left base. Cardiomegaly with vascular congestion. Mild diffuse interstitial prominence suggests a mild component of edema. Aortic atherosclerosis. No pneumothorax. IMPRESSION: 1. Small right-sided pleural effusion with right middle lobe and basilar atelectasis or pneumonia 2. Cardiomegaly with vascular congestion and mild diffuse interstitial prominence suggesting edema Electronically Signed   By: Donavan Foil M.D.   On: 04/30/2017 23:49   Ct Head Code Stroke Wo Contrast  Result Date: 04/30/2017 CLINICAL DATA:  Code stroke. RIGHT facial droop, last  seen normal at 2000 hours. EXAM: CT HEAD WITHOUT CONTRAST TECHNIQUE: Contiguous axial images were obtained from the base of the skull through the vertex without intravenous contrast. COMPARISON:  CT HEAD February 20, 2015 FINDINGS: BRAIN: No intraparenchymal hemorrhage, mass effect nor midline shift. RIGHT mesial occipital lobe encephalomalacia. Mild ex vacuo dilatation RIGHT frontal horn of lateral ventricle. Moderate to severe parenchymal brain volume loss. No hydrocephalus. Patchy supratentorial white matter hypodensities unchanged. No abnormal extra-axial fluid collections. VASCULAR: Moderate calcific atherosclerosis of the carotid siphons. SKULL: No skull fracture. Severe temporomandibular osteoarthrosis. No significant scalp soft tissue swelling. SINUSES/ORBITS: Trace paranasal sinus mucosal thickening without air-fluid levels. Mastoid air cells are well aerated. Soft tissue within LEFT external auditory canal compatible with cerumen. Included ocular globes and orbital contents are non-suspicious. Status post bilateral ocular lens implants. OTHER: None. ASPECTS [facility] Stroke Program Early CT Score) - Ganglionic level infarction (caudate, lentiform nuclei, internal capsule, insula, M1-M3 cortex): 7 - Supraganglionic infarction (M4-M6 cortex): 3 Total score (0-10 with 10 being normal): 10 IMPRESSION: 1. No acute intracranial process. 2. ASPECTS is 10. 3. 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 Critical Value/emergent results text paged to [place], Neurology via AMION secure system on 04/30/2017 at 11:05 pm, including interpreting physician's phone number. Electronically Signed   By: Elon Alas M.D.   On: 04/30/2017 23:06    EKG: Independently reviewed.  A. fib with initially increased.  Assessment/Plan Principal Problem:   Sepsis ([place]) Active Problems:   Mitral regurgitation due to cusp prolapse   Atrial  fibrillation (H[facility])   CIDP (chronic inflammatory demyelinating polyneuropathy) (H[facility])   Memory loss    1. Possible developing sepsis from pneumonia -patient has been placed on empiric antibiotics check cultures influenza PCR urine for Legionella and strep antigen.  Follow lactate levels and pro calcitonin levels. 2. Generalized weakness -likely from deconditioning.  Follow MRI brain.  Physical therapy consult. 3. History of A. fib initially was in RVR which improved.  Patient is not on anticoagulation secondary to history of GI bleed on aspirin.  Which will be continued.  Continue beta-blockers. 4. Recently diagnosed shingles of the left groin and was started on Valtrex yesterday.  Which would be continued.  5. History of CIDP. 6. History of severe MR closely follow respiratory status for any worsening.   DVT prophylaxis: Lovenox. Code Status: Full code. Family Communication: Patient's daughter. Disposition Plan: To be determined. Consults called: Neurology. Admission status: Inpatient.   Rise Patience MD [facility] Pager [phone].  If 7PM-7AM, please contact night-coverage [email or web address] Password [facility]  05/01/2017, 12:45 AM

## 2017-05-01 NOTE — ED Notes (Addendum)
NS IV bolus infusing , IV sites intact , Levaquin IV antibiotic infusing .

## 2017-05-01 NOTE — ED Notes (Signed)
Pt denies complaint but does complain that noises bother him.

## 2017-05-01 NOTE — ED Notes (Signed)
Cleaned patient up placed a condom cath on patient resting with call bell in reach

## 2017-05-01 NOTE — Progress Notes (Signed)
Patient seen and examined in the ED. NO family members at his bedside. He is alert and oriented x 3. Admits to generalized weakness. Denies chest pain or dyspnea.  Please refer to Dr. Hal Hope H&P dictated on 05/01/17 for further details of the assessment and plan.

## 2017-05-01 NOTE — Progress Notes (Signed)
Pharmacy Antibiotic Note  Rodney Malone is a 84 y.o. male admitted on 04/30/2017 with PNA/sepsis.  Pharmacy has been consulted for Vancomycin Aztreonam and Levaquin dosing.  Vancomycin 1 g IV given in ED at 0200  Plan: Vancomycin 750 mg IV q24h Levaquin 500 mg IV q24h Aztreonam 1 g IV q8h  Height: 5\' 11"  (180.3 cm) Weight: 140 lb 3.4 oz (63.6 kg) IBW/kg (Calculated) : 75.3  Temp (24hrs), Avg:99.4 F (37.4 C), Min:99.2 F (37.3 C), Max:99.5 F (37.5 C)  Recent Labs  Lab 04/30/17 2247 04/30/17 2257 04/30/17 2359 05/01/17 0052 05/01/17 0258  WBC 14.9*  --   --  13.3* 13.0*  CREATININE 0.90 0.80  --  0.81 0.81  LATICACIDVEN  --   --  2.35* 1.9 1.7    Estimated Creatinine Clearance: 52.3 mL/min (by C-G formula based on SCr of 0.81 mg/dL).    Allergies  Allergen Reactions  . Penicillins Other (See Comments)    Has patient had a PCN reaction causing immediate rash, facial/tongue/throat swelling, SOB or lightheadedness with hypotension: no Has patient had a PCN reaction causing severe rash involving mucus membranes or skin necrosis: no Has patient had a PCN reaction that required hospitalization no Has patient had a PCN reaction occurring within the last 10 years: no If all of the above answers are "NO", then may proceed with Cephalosporin use.    Caryl Pina 05/01/2017 6:57 AM

## 2017-05-01 NOTE — ED Notes (Signed)
Pt  Eating llunch but only eats 30% or less.

## 2017-05-02 ENCOUNTER — Inpatient Hospital Stay (HOSPITAL_COMMUNITY): Payer: Medicare Other

## 2017-05-02 DIAGNOSIS — B028 Zoster with other complications: Secondary | ICD-10-CM

## 2017-05-02 DIAGNOSIS — I051 Rheumatic mitral insufficiency: Secondary | ICD-10-CM

## 2017-05-02 DIAGNOSIS — R413 Other amnesia: Secondary | ICD-10-CM

## 2017-05-02 DIAGNOSIS — J189 Pneumonia, unspecified organism: Secondary | ICD-10-CM

## 2017-05-02 LAB — LEGIONELLA PNEUMOPHILA SEROGP 1 UR AG: L. pneumophila Serogp 1 Ur Ag: NEGATIVE

## 2017-05-02 MED ORDER — SODIUM CHLORIDE 0.9 % IV SOLN
1.0000 g | Freq: Two times a day (BID) | INTRAVENOUS | Status: DC
  Administered 2017-05-02 – 2017-05-08 (×11): 1 g via INTRAVENOUS
  Filled 2017-05-02 (×13): qty 1

## 2017-05-02 NOTE — Progress Notes (Signed)
Pt medicated with 1 mg IV Ativan, will continue to monitor, no other changes noted.

## 2017-05-02 NOTE — Progress Notes (Signed)
Text paged MD D/T patient pulling at his EKG wires, taking off hismo2 and crawling out of bed even with several nursing staff coming in numerous times to redirect him, awaiting new orders.

## 2017-05-02 NOTE — Progress Notes (Signed)
Pt resting quietly at this time with no s/s of distress, will continue to monitor.

## 2017-05-02 NOTE — Progress Notes (Addendum)
PROGRESS NOTE  Rodney Malone LKT:[id number] DOB: [birth year] DOA: 04/30/2017 PCP: [name], MD  HPI/Recap of past 24 hours: Rodney Malone is a 84 y.o. male with history of atrial fibrillation not on anticoagulation secondary to risk of bleed, severe mitral regurgitation, history of CIDP was brought to the ER after patient was found to be weak and dizzy.  As per the patient's daughter patient usually uses a walker but today found very weak and had to be taken to the bathroom with help.  After going to the bathroom patient found it difficult to get up and EMS was called and was brought to the ER.  EMS also noticed that patient was mildly febrile.  Patient also was diagnosed with shingles 2 days ago and was started on Valtrex.  05/02/17: patient seen and examined at his bedside. He is alert and oriented x 1. Moves all 4. Denies numbness or chest pain. Appears to have trouble swallowing his ensure drink. Speech therapy consulted to assess.   Assessment/Plan: Principal Problem:   Sepsis ([place]) Active Problems:   Mitral regurgitation due to cusp prolapse   Atrial fibrillation (HCC)   CIDP (chronic inflammatory demyelinating polyneuropathy) (H[facility])   Memory loss   Pressure injury of skin  HCAP, poa  -continue IV vanc and IV cefepime -swallow eval ordered to r/o dysphagia -continue duonebs  -continue O2 supplement to maintain O2 sat 92% or greater -continuous O2 monitoring  Generalized weakness -r/o acute CVA -MRI ordered and pending -neurology following -increase po calorie intake as tolerated  Chronic a-fib -rate contrrolled -continue home meds -no anticoagulation due to hx of GI bleed on aspirirn  Recently diagnosed shingles -continue valtrex -airborne/contact precaution  Hx of GI bleed -monitor H&H -no overt sign of bleeding  Hx of severe MR -continue monitoring  Moderate protein calorie malnutrition -albumin 3.4 -nutritionist consult -continue oral  supplement  Mild hyponatremia -na+ 134 -repeat BMP am  Eleveated AST/total bilirubin -continue to monitor for symptoms or signs -repeat levels   Code Status: full  Family Communication: none at bedside  Disposition Plan: to be determined   Consultants:  neurology  Procedures:  none  Antimicrobials:  IV vancomycin and IV cefepime  On valtrex for shingles   DVT prophylaxis:  sq lovenox   Objective: Vitals:   05/02/17 0618 05/02/17 1248 05/02/17 1445 05/02/17 1450  BP: (!) 150/90 114/67    Pulse: 99 87    Resp: 18     Temp: 98.5 F (36.9 C) 98 F (36.7 C)    TempSrc: Axillary Axillary    SpO2: 100% 99% 98% 96%  Weight: 67 kg (147 lb 11.3 oz)     Height:        Intake/Output Summary (Last 24 hours) at 05/02/2017 1627 Last data filed at 05/02/2017 1300 Gross per 24 hour  Intake 1782 ml  Output 500 ml  Net 1282 ml   Filed Weights   04/30/17 2200 04/30/17 2307 05/02/17 0618  Weight: 63.3 kg (139 lb 8.8 oz) 63.6 kg (140 lb 3.4 oz) 67 kg (147 lb 11.3 oz)    Exam:   General:  84 yo CM frail NAD alert and oriented to self  Cardiovascular: IRR no rubs or gallops   Respiratory: CTA no wheezes or rales   Abdomen: soft NT ND NBS x4   Musculoskeletal: b/l LE 2+ edema; moves lower extremities but equal weakness noted   Skin: no rash noted  Psychiatry: mood is appropriate for condition and setting   Data  Reviewed: CBC: Recent Labs  Lab 04/30/17 2247 04/30/17 2257 05/01/17 0052 05/01/17 0258  WBC 14.9*  --  13.3* 13.0*  NEUTROABS 11.1*  --   --  9.3*  HGB 15.0 15.6 13.2 13.1  HCT 43.8 46.0 39.5 39.9  MCV 94.6  --  93.8 94.1  PLT 308  --  290 175   Basic Metabolic Panel: Recent Labs  Lab 04/30/17 2247 04/30/17 2257 05/01/17 0052 05/01/17 0258  NA 138 139  --  134*  K 4.8 4.7  --  4.3  CL 105 103  --  105  CO2 22  --   --  20*  GLUCOSE 144* 133*  --  135*  BUN 19 23*  --  19  CREATININE 0.90 0.80 0.81 0.81  CALCIUM 9.8  --   --   8.9  MG  --   --  1.8  --    GFR: Estimated Creatinine Clearance: 55.1 mL/min (by C-G formula based on SCr of 0.81 mg/dL). Liver Function Tests: Recent Labs  Lab 04/30/17 2247 05/01/17 0258  AST 50* 58*  ALT 20 23  ALKPHOS 154* 134*  BILITOT 2.3* 2.0*  PROT 7.5 6.2*  ALBUMIN 4.1 3.4*   No results for input(s): LIPASE, AMYLASE in the last 168 hours. No results for input(s): AMMONIA in the last 168 hours. Coagulation Profile: Recent Labs  Lab 04/30/17 2247  INR 1.57   Cardiac Enzymes: Recent Labs  Lab 05/01/17 0052  CKTOTAL 186  TROPONINI 0.03*   BNP (last 3 results) No results for input(s): PROBNP in the last 8760 hours. HbA1C: No results for input(s): HGBA1C in the last 72 hours. CBG: Recent Labs  Lab 04/30/17 2251  GLUCAP 132*   Lipid Profile: No results for input(s): CHOL, HDL, LDLCALC, TRIG, CHOLHDL, LDLDIRECT in the last 72 hours. Thyroid Function Tests: Recent Labs    05/01/17 0052  TSH 1.956   Anemia Panel: No results for input(s): VITAMINB12, FOLATE, FERRITIN, TIBC, IRON, RETICCTPCT in the last 72 hours. Urine analysis:    Component Value Date/Time   COLORURINE YELLOW 05/01/2017 0000   APPEARANCEUR CLEAR 05/01/2017 0000   LABSPEC 1.024 05/01/2017 0000   PHURINE 5.0 05/01/2017 0000   GLUCOSEU NEGATIVE 05/01/2017 0000   HGBUR NEGATIVE 05/01/2017 0000   BILIRUBINUR NEGATIVE 05/01/2017 0000   KETONESUR NEGATIVE 05/01/2017 0000   PROTEINUR 30 (A) 05/01/2017 0000   UROBILINOGEN 0.2 08/09/2008 1949   NITRITE NEGATIVE 05/01/2017 0000   LEUKOCYTESUR NEGATIVE 05/01/2017 0000   Sepsis Labs: @LABRCNTIP (procalcitonin:4,lacticidven:4)  )No results found for this or any previous visit (from the past 240 hour(s)).    Studies: No results found.  Scheduled Meds: . aspirin EC  81 mg Oral Daily  . enoxaparin (LOVENOX) injection  40 mg Subcutaneous Q24H  . metoprolol succinate  100 mg Oral Daily  . valACYclovir  1,000 mg Oral TID    Continuous  Infusions: . ceFEPime (MAXIPIME) IV    . vancomycin Stopped (05/02/17 1025)     LOS: 1 day     Kayleen Memos, MD [facility] Pager [phone]  If 7PM-7AM, please contact night-coverage [email or web address] Password [facility] 05/02/2017, 4:27 PM

## 2017-05-02 NOTE — Clinical Social Work Note (Signed)
Clinical Social Work Assessment  Patient Details  Name: Rodney Malone MRN: [id number] Date of Birth: 10/21/1922  Date of referral:  05/02/17               Reason for consult:  Facility Placement(from [facility])                Permission sought to share information with:  Facility Sport and exercise psychologist, Family Supports Permission granted to share information::  No(patient disoriented)  Name::     Rodney Malone  Agency::  [facility]  Relationship::  daughter  Contact Information:  [phone]  Housing/Transportation Living arrangements for the past 2 months:  [place] of Information:  Facility, Adult Children Patient Interpreter Needed:  None Criminal Activity/Legal Involvement Pertinent to Current Situation/Hospitalization:  No - Comment as needed Significant Relationships:  Adult Children, Spouse Lives with:  Spouse, Facility Resident Do you feel safe going back to the place where you live?  Yes Need for family participation in patient care:  Yes (Comment)  Care giving concerns: Patient from [facility] with spouse.   Social Worker assessment / plan: CSW spoke to [place] at [facility] and confirmed patient lives in their ALF. CSW also spoke to patient's daughter, Rodney Malone, via phone. Daughter indicated patient and his wife have lived at [facility] for 8 years. Patient has been in rehab at [facility] in the past; not evaluated yet by PT this admission though. Daughter plans to visit patient this afternoon and had questions about patient's condition. CSW to follow and support with disposition planning.  Employment status:  Retired Forensic scientist:  Medicare PT Recommendations:  Not assessed at this time [place] / Referral to community resources:  Other (Comment Required)([facility])  Patient/Family's Response to care: Daughter appreciative of patient's care.  Patient/Family's Understanding of and Emotional Response to Diagnosis, Current  Treatment, and Prognosis: Daughter with some understanding of patient's condition, though has questions and would like updates. Daughter available to help with disposition planning pending patient's progress.  Emotional Assessment Appearance:  Appears stated age Attitude/Demeanor/Rapport:  Unable to Assess Affect (typically observed):  Unable to Assess Orientation:  Oriented to Self Alcohol / Substance use:  Not Applicable Psych involvement (Current and /or in the community):  No (Comment)  Discharge Needs  Concerns to be addressed:  Discharge Planning Concerns, Care Coordination Readmission within the last 30 days:  No Current discharge risk:  Physical Impairment, Cognitively Impaired Barriers to Discharge:  Continued Medical Work up   [name], LCSW 05/02/2017, 10:44 AM

## 2017-05-03 DIAGNOSIS — G6181 Chronic inflammatory demyelinating polyneuritis: Secondary | ICD-10-CM

## 2017-05-03 LAB — BASIC METABOLIC PANEL
ANION GAP: 9 (ref 5–15)
BUN: 19 mg/dL (ref 6–20)
CHLORIDE: 105 mmol/L (ref 101–111)
CO2: 18 mmol/L — ABNORMAL LOW (ref 22–32)
Calcium: 9 mg/dL (ref 8.9–10.3)
Creatinine, Ser: 0.77 mg/dL (ref 0.61–1.24)
Glucose, Bld: 152 mg/dL — ABNORMAL HIGH (ref 65–99)
POTASSIUM: 4.2 mmol/L (ref 3.5–5.1)
SODIUM: 132 mmol/L — AB (ref 135–145)

## 2017-05-03 LAB — CBC
HCT: 39.9 % (ref 39.0–52.0)
HEMOGLOBIN: 13.1 g/dL (ref 13.0–17.0)
MCH: 31 pg (ref 26.0–34.0)
MCHC: 32.8 g/dL (ref 30.0–36.0)
MCV: 94.5 fL (ref 78.0–100.0)
PLATELETS: 263 10*3/uL (ref 150–400)
RBC: 4.22 MIL/uL (ref 4.22–5.81)
RDW: 16.2 % — ABNORMAL HIGH (ref 11.5–15.5)
WBC: 14 10*3/uL — ABNORMAL HIGH (ref 4.0–10.5)

## 2017-05-03 LAB — GLUCOSE, CAPILLARY
GLUCOSE-CAPILLARY: 106 mg/dL — AB (ref 65–99)
GLUCOSE-CAPILLARY: 129 mg/dL — AB (ref 65–99)
GLUCOSE-CAPILLARY: 122 mg/dL — AB (ref 65–99)
GLUCOSE-CAPILLARY: 95 mg/dL (ref 65–99)

## 2017-05-03 LAB — MRSA PCR SCREENING: MRSA by PCR: NEGATIVE

## 2017-05-03 MED ORDER — LORAZEPAM 2 MG/ML IJ SOLN
0.5000 mg | Freq: Once | INTRAMUSCULAR | Status: AC
  Administered 2017-05-03: 0.5 mg via INTRAVENOUS
  Filled 2017-05-03: qty 1

## 2017-05-03 MED ORDER — METRONIDAZOLE IN NACL 5-0.79 MG/ML-% IV SOLN
500.0000 mg | Freq: Three times a day (TID) | INTRAVENOUS | Status: DC
  Administered 2017-05-03 – 2017-05-08 (×15): 500 mg via INTRAVENOUS
  Filled 2017-05-03 (×16): qty 100

## 2017-05-03 MED ORDER — RESOURCE THICKENUP CLEAR PO POWD
Freq: Once | ORAL | Status: AC
  Administered 2017-05-03: 17:00:00 via ORAL
  Filled 2017-05-03: qty 125

## 2017-05-03 NOTE — Evaluation (Addendum)
Physical Therapy Evaluation Patient Details Name: Rodney Malone MRN: [id number] DOB: April 11, 1922 Today's Date: 05/03/2017   History of Present Illness  Rodney Malone is a 84 y.o. male with history of atrial fibrillation not on anticoagulation secondary to risk of bleed, severe mitral regurgitation, history of CIDP was brought to the ER after patient was found to be weak and dizzy.   Clinical Impression  Pt admitted with/for weakness and dizziness.  Pt presently very weak needing significant assist for mobility.  Pt currently limited functionally due to the problems listed. ( See problems list.)   Pt will benefit from PT to maximize function and safety in order to get ready for next venue listed below.     Follow Up Recommendations SNF;Supervision/Assistance - 24 hour    Equipment Recommendations  Other (comment);None recommended by PT(TBA)    Recommendations for Other Services       Precautions / Restrictions Precautions Precautions: Fall      Mobility  Bed Mobility Overal bed mobility: Needs Assistance Bed Mobility: Rolling;Sidelying to Sit;Sit to Sidelying Rolling: Mod assist Sidelying to sit: Mod assist;Max assist     Sit to sidelying: Mod assist;Max assist General bed mobility comments: cues for technique and assist for trunk stability and cominging up and forward.  Transfers Overall transfer level: Needs assistance   Transfers: Sit to/from Stand;Lateral/Scoot Transfers Sit to Stand: Mod assist        Lateral/Scoot Transfers: Mod assist General transfer comment: assist for stability and coordinating stand and scoot.  Ambulation/Gait                Stairs            Wheelchair Mobility    Modified Rankin (Stroke Patients Only)       Balance Overall balance assessment: Needs assistance Sitting-balance support: No upper extremity supported;Single extremity supported Sitting balance-Leahy Scale: Fair Sitting balance - Comments: can w/shift and  attempt scooting, but little actually happens   Standing balance support: Bilateral upper extremity supported Standing balance-Leahy Scale: Poor Standing balance comment: requires external support                             Pertinent Vitals/Pain Pain Assessment: Faces Faces Pain Scale: Hurts little more Pain Location: feet Pain Descriptors / Indicators: Discomfort;Sore Pain Intervention(s): Monitored during session    Home Living Family/patient expects to be discharged to:: Assisted living               Home Equipment: Walker - 2 wheels;Cane - single point;Shower seat;Grab bars - tub/shower      Prior Function Level of Independence: Independent with assistive device(s)(in the home)         Comments: Completes his own ADL's, sits for shaving and brushing teeth.     Hand Dominance        Extremity/Trunk Assessment   Upper Extremity Assessment Upper Extremity Assessment: Defer to OT evaluation    Lower Extremity Assessment Lower Extremity Assessment: Generalized weakness;RLE deficits/detail;LLE deficits/detail RLE Deficits / Details: minimal df/pf and weakness overall RLE Coordination: decreased fine motor LLE Deficits / Details: minimal df/pf and overall general weakness LLE Coordination: decreased fine motor       Communication   Communication: No difficulties;HOH  Cognition Arousal/Alertness: Awake/alert Behavior During Therapy: WFL for tasks assessed/performed Overall Cognitive Status: Within Functional Limits for tasks assessed  General Comments      Exercises     Assessment/Plan    PT Assessment Patient needs continued PT services  PT Problem List Decreased strength;Decreased activity tolerance;Decreased balance;Decreased mobility;Decreased knowledge of use of DME;Pain       PT Treatment Interventions Gait training;Functional mobility training;Therapeutic activities;Balance  training;Patient/family education    PT Goals (Current goals can be found in the Care Plan section)  Acute Rehab PT Goals Patient Stated Goal: get back home PT Goal Formulation: With patient Time For Goal Achievement: 05/17/17 Potential to Achieve Goals: Good    Frequency Min 2X/week   Barriers to discharge        Co-evaluation               AM-PAC PT "6 Clicks" Daily Activity  Outcome Measure Difficulty turning over in bed (including adjusting bedclothes, sheets and blankets)?: Unable Difficulty moving from lying on back to sitting on the side of the bed? : Unable Difficulty sitting down on and standing up from a chair with arms (e.g., wheelchair, bedside commode, etc,.)?: Unable Help needed moving to and from a bed to chair (including a wheelchair)?: A Lot Help needed walking in hospital room?: Total Help needed climbing 3-5 steps with a railing? : Total 6 Click Score: 7    End of Session   Activity Tolerance: Patient limited by fatigue Patient left: in bed;with call bell/phone within reach;with bed alarm set Nurse Communication: Mobility status PT Visit Diagnosis: Muscle weakness (generalized) (M62.81);Other abnormalities of gait and mobility (R26.89)    Time: 3818-2993 PT Time Calculation (min) (ACUTE ONLY): 36 min   Charges:   PT Evaluation $PT Eval Moderate Complexity: 1 Mod PT Treatments $Therapeutic Activity: 8-22 mins   PT G Codes:        2017/05/04  Donnella Sham, PT [phone]  (pager)  Tessie Fass Thandiwe Siragusa 05-04-17, 4:48 PM

## 2017-05-03 NOTE — Progress Notes (Signed)
Pharmacy Antibiotic Note  Rodney Malone is a 84 y.o. male admitted on 04/30/2017 with PNA/sepsis.   Transitioned to Cefepime and Vancomycin  Day # 3 of Cefepime / Vancomycin Afebrile, cultures negative, lactate improved  Plan: Vancomycin 750 mg IV q24h - > consider stopping soon? Cefepime 1 gram iv Q 12 hours Continue to follow  Height: 5\' 11"  (180.3 cm) Weight: 146 lb 13.2 oz (66.6 kg) IBW/kg (Calculated) : 75.3  Temp (24hrs), Avg:97.5 F (36.4 C), Min:97.2 F (36.2 C), Max:98 F (36.7 C)  Recent Labs  Lab 04/30/17 2247 04/30/17 2257 04/30/17 2359 05/01/17 0052 05/01/17 0258 05/03/17 0943  WBC 14.9*  --   --  13.3* 13.0* 14.0*  CREATININE 0.90 0.80  --  0.81 0.81 0.77  LATICACIDVEN  --   --  2.35* 1.9 1.7  --     Estimated Creatinine Clearance: 55.5 mL/min (by C-G formula based on SCr of 0.77 mg/dL).    Allergies  Allergen Reactions  . Penicillins Other (See Comments)    Has patient had a PCN reaction causing immediate rash, facial/tongue/throat swelling, SOB or lightheadedness with hypotension: no Has patient had a PCN reaction causing severe rash involving mucus membranes or skin necrosis: no Has patient had a PCN reaction that required hospitalization no Has patient had a PCN reaction occurring within the last 10 years: no If all of the above answers are "NO", then may proceed with Cephalosporin use.    Thank you Anette Guarneri, PharmD [phone] 05/03/2017 10:31 AM

## 2017-05-03 NOTE — Plan of Care (Signed)
  Progressing Elimination: Will not experience complications related to bowel motility 05/03/2017 0147 - Progressing by Colonel Bald, RN Note No complications noted. Pain Managment: General experience of comfort will improve 05/03/2017 0147 - Progressing by Colonel Bald, RN Note Denies c/o pain or discomfort.  Room temperature adjusted, and extra blankets given.  Pt assisted in turning in bed.

## 2017-05-03 NOTE — Progress Notes (Signed)
  Speech Language Pathology Treatment: Dysphagia  Patient Details Name: Rodney Malone MRN: [id number] DOB: 1923/02/14 Today's Date: 05/03/2017 Time: 1030-1100 SLP Time Calculation (min) (ACUTE ONLY): 30 min  Assessment / Plan / Recommendation Clinical Impression  F/u after this morning's clinical assessment to determine readiness/candidacy for FEES vs MBS today.  Given airborne isolation, pt cannot be transported for MBS today.  Pt given trials of nectar-thick liquids to determine if toleration better than with thins.  He presents with persisting, multiple swallows per single bolus (4-5); no cough nor wet phonation with nectars; wet phonation/occasionsl cough with thins.  Long discussion with son, Rodney Malone, re: generalized risk of aspiration with deconditioning in frail elderly; (it's unclear if CIPD dx is having current impact on swallow).  After discussion of two methods of instrumental study, Rodney Malone prefers to wait until airborne precautions have been lifted and pursue MBS instead of the bedside FEES.  In the meantime, we agreed to change diet to dysphagia 3, nectar-thick liquids to minimize risk of aspiration.  We discussed our inability to fully prevent aspiration.  SLP will f/u in 1-2 days to determine if clinical presentation has improved and if MBS is still warranted.  Rodney Malone agrees with plan.  Discussed with RN.    HPI HPI: Rodney Malone a 84 y.o.malewithhistory of atrial fibrillation not on anticoagulation secondary to risk of bleed, severe mitral regurgitation, history of CIDP was brought to the ER after patient was found to be weak and dizzy. As per the patient's daughter patient usually uses a walker but today found very weak and had to be taken to the bathroom with help. After going to the bathroom patient found it difficult to get up and EMS was called and was brought to the ER. EMS also noticed that patient was mildly febrile.  Patient also was diagnosed with shingles 2 days ago and was  started on Valtrex.  MRI of the brain is negative for acute findings.  Chest xray is showing small right pleural effusion with RML and basilar atelectasis or PNA.        SLP Plan  Continue with current plan of care       Recommendations  Diet recommendations: Dysphagia 3 (mechanical soft);Nectar-thick liquid Liquids provided via: Cup Medication Administration: Whole meds with puree Supervision: Staff to assist with self feeding;Patient able to self feed Compensations: Slow rate;Small sips/bites Postural Changes and/or Swallow Maneuvers: Seated upright 90 degrees                Oral Care Recommendations: Oral care BID Follow up Recommendations: Other (comment)(tba) SLP Visit Diagnosis: Dysphagia, oropharyngeal phase (R13.12) Plan: Continue with current plan of care       GO                Rodney Malone 05/03/2017, 11:46 AM

## 2017-05-03 NOTE — Progress Notes (Signed)
PROGRESS NOTE  Rodney Malone HCW:[id number] DOB: 1922/08/15 DOA: 04/30/2017 PCP: [name], MD  HPI/Recap of past 24 hours: Rodney Malone is a 84 y.o. male with history of atrial fibrillation not on anticoagulation secondary to risk of bleed, severe mitral regurgitation, history of CIDP was brought to the ER after patient was found to be weak and dizzy.  As per the patient's daughter patient usually uses a walker but today found very weak and had to be taken to the bathroom with help.  After going to the bathroom patient found it difficult to get up and EMS was called and was brought to the ER.  EMS also noticed that patient was mildly febrile.  Patient also was diagnosed with shingles 2 days ago and was started on Valtrex.  05/03/17: No new complaints. Speech recommends dysphagia 3 with nectar thick liquid provided via cup.  Assessment/Plan: Principal Problem:   Sepsis ([place]) Active Problems:   Mitral regurgitation due to cusp prolapse   Atrial fibrillation (H[facility])   CIDP (chronic inflammatory demyelinating polyneuropathy) (HCC)   Memory loss   Pressure injury of skin  HCAP, poa with suspected aspiration -continue IV vanc and IV cefepime -add IV flagyl -swallow eval ordered to r/o dysphagia -continue duonebs  -continue O2 supplement to maintain O2 sat 92% or greater -continuous O2 monitoring  Dysphagia 3 -speech therapist recommends dysphagia 3 diet and nectar thick liquid -aspiration precaution  Generalized weakness -r/o acute CVA -MRI brain negative -neurology signed off -increase po calorie intake as tolerated  Chronic a-fib -rate contrrolled -05/03/17 continue home meds -no anticoagulation due to hx of GI bleed on aspirirn  Recently diagnosed shingles -05/03/17 continue valtrex -airborne/contact precaution  Hx of GI bleed -monitor H&H -no overt sign of bleeding  Hx of severe MR -continue monitoring  Moderate protein calorie malnutrition -albumin  3.4 -nutritionist consult -continue oral supplement  Mild hyponatremia -na+ 134 -repeat BMP am  Eleveated AST/total bilirubin -continue to monitor for symptoms or signs of liver disease -repeat levels   Code Status: full  Family Communication: none at bedside  Disposition Plan: to be determined   Consultants:  neurology  Procedures:  none  Antimicrobials:  IV vancomycin IV cefepime, IV flagyl  On valtrex for shingles   DVT prophylaxis:  sq lovenox   Objective: Vitals:   05/02/17 2033 05/02/17 2100 05/02/17 2200 05/03/17 0300  BP: 98/61   123/74  Pulse: 86     Resp: (!) 22   18  Temp: (!) 97.4 F (36.3 C)   (!) 97.2 F (36.2 C)  TempSrc: Oral   Oral  SpO2: 90% (!) 86% 96% 94%  Weight:    66.6 kg (146 lb 13.2 oz)  Height:        Intake/Output Summary (Last 24 hours) at 05/03/2017 0847 Last data filed at 05/03/2017 0602 Gross per 24 hour  Intake 1427 ml  Output 600 ml  Net 827 ml   Filed Weights   04/30/17 2307 05/02/17 0618 05/03/17 0300  Weight: 63.6 kg (140 lb 3.4 oz) 67 kg (147 lb 11.3 oz) 66.6 kg (146 lb 13.2 oz)    Exam: 05/03/17 patient seen and examined. Physical exam is unchanged from prior   General:  Rodney Malone alert and oriented to self  Cardiovascular: IRR no rubs or gallops   Respiratory: diffused rales. No wheezes  Abdomen: soft NT ND NBS x4   Musculoskeletal: b/l LE 2+ edema; moves lower extremities but equal weakness noted  Skin: no rash noted  Psychiatry: mood is appropriate for condition and setting   Data Reviewed: CBC: Recent Labs  Lab 04/30/17 2247 04/30/17 2257 05/01/17 0052 05/01/17 0258  WBC 14.9*  --  13.3* 13.0*  NEUTROABS 11.1*  --   --  9.3*  HGB 15.0 15.6 13.2 13.1  HCT 43.8 46.0 39.5 39.9  MCV 94.6  --  93.8 94.1  PLT 308  --  290 409   Basic Metabolic Panel: Recent Labs  Lab 04/30/17 2247 04/30/17 2257 05/01/17 0052 05/01/17 0258  NA 138 139  --  134*  K 4.8 4.7  --  4.3  CL  105 103  --  105  CO2 22  --   --  20*  GLUCOSE 144* 133*  --  135*  BUN 19 23*  --  19  CREATININE 0.90 0.80 0.81 0.81  CALCIUM 9.8  --   --  8.9  MG  --   --  1.8  --    GFR: Estimated Creatinine Clearance: 54.8 mL/min (by C-G formula based on SCr of 0.81 mg/dL). Liver Function Tests: Recent Labs  Lab 04/30/17 2247 05/01/17 0258  AST 50* 58*  ALT 20 23  ALKPHOS 154* 134*  BILITOT 2.3* 2.0*  PROT 7.5 6.2*  ALBUMIN 4.1 3.4*   No results for input(s): LIPASE, AMYLASE in the last 168 hours. No results for input(s): AMMONIA in the last 168 hours. Coagulation Profile: Recent Labs  Lab 04/30/17 2247  INR 1.57   Cardiac Enzymes: Recent Labs  Lab 05/01/17 0052  CKTOTAL 186  TROPONINI 0.03*   BNP (last 3 results) No results for input(s): PROBNP in the last 8760 hours. HbA1C: No results for input(s): HGBA1C in the last 72 hours. CBG: Recent Labs  Lab 04/30/17 2251 05/03/17 0744  GLUCAP 132* 106*   Lipid Profile: No results for input(s): CHOL, HDL, LDLCALC, TRIG, CHOLHDL, LDLDIRECT in the last 72 hours. Thyroid Function Tests: Recent Labs    05/01/17 0052  TSH 1.956   Anemia Panel: No results for input(s): VITAMINB12, FOLATE, FERRITIN, TIBC, IRON, RETICCTPCT in the last 72 hours. Urine analysis:    Component Value Date/Time   COLORURINE YELLOW 05/01/2017 0000   APPEARANCEUR CLEAR 05/01/2017 0000   LABSPEC 1.024 05/01/2017 0000   PHURINE 5.0 05/01/2017 0000   GLUCOSEU NEGATIVE 05/01/2017 0000   HGBUR NEGATIVE 05/01/2017 0000   BILIRUBINUR NEGATIVE 05/01/2017 0000   KETONESUR NEGATIVE 05/01/2017 0000   PROTEINUR 30 (A) 05/01/2017 0000   UROBILINOGEN 0.2 08/09/2008 1949   NITRITE NEGATIVE 05/01/2017 0000   LEUKOCYTESUR NEGATIVE 05/01/2017 0000   Sepsis Labs: @LABRCNTIP (procalcitonin:4,lacticidven:4)  ) Recent Results (from the past 240 hour(s))  Blood Culture (routine x 2)     Status: None (Preliminary result)   Collection Time: 04/30/17 11:40 PM   Result Value Ref Range Status   Specimen Description BLOOD LEFT WRIST  Final   Special Requests   Final    BOTTLES DRAWN AEROBIC AND ANAEROBIC Blood Culture adequate volume   Culture   Final    NO GROWTH 1 DAY Performed at [facility], [place] [address], [place], [place] [address]    Report Status PENDING  Incomplete  Blood Culture (routine x 2)     Status: None (Preliminary result)   Collection Time: 04/30/17 11:50 PM  Result Value Ref Range Status   Specimen Description BLOOD RIGHT HAND  Final   Special Requests AEROBIC BOTTLE ONLY Blood Culture adequate volume  Final  Culture   Final    NO GROWTH 1 DAY Performed at [facility], [place] [address], [place], [place] [address]    Report Status PENDING  Incomplete      Studies: Mr Brain 39 Contrast  Result Date: 05/02/2017 CLINICAL DATA:  84 y/o  M; weakness and dizziness. EXAM: MRI HEAD WITHOUT CONTRAST TECHNIQUE: Multiplanar, multiecho pulse sequences of the brain and surrounding structures were obtained without intravenous contrast. COMPARISON:  04/30/2017 CT head FINDINGS: Brain: No acute infarction, hemorrhage, hydrocephalus, extra-axial collection or mass lesion. Right anteromedial frontal lobe encephalomalacia. Patchynonspecific foci of T2 FLAIR hyperintense signal abnormality in predominantly periventricular are compatible withmoderatechronic microvascular ischemic changes for age. Moderatebrain parenchymal volume loss. Vascular: Normal flow voids. Skull and upper cervical spine: Normal marrow signal. Sinuses/Orbits: Small maxillary sinus mucous retention cyst. No abnormal signal of mastoid air cells. Bilateral intra-ocular lens replacement. Other: None. IMPRESSION: 1. No acute intracranial abnormality identified. 2. Moderate chronic microvascular ischemic changes and moderate parenchymal volume loss of the brain. 3. Chronic right anteromedial frontal encephalomalacia. Electronically Signed   By: Kristine Garbe  M.D.   On: 05/02/2017 18:00    Scheduled Meds: . aspirin EC  81 mg Oral Daily  . enoxaparin (LOVENOX) injection  40 mg Subcutaneous Q24H  . metoprolol succinate  100 mg Oral Daily  . valACYclovir  1,000 mg Oral TID    Continuous Infusions: . ceFEPime (MAXIPIME) IV Stopped (05/03/17 0602)  . vancomycin Stopped (05/03/17 1224)     LOS: 2 days     Kayleen Memos, MD [facility] Pager [phone]  If 7PM-7AM, please contact night-coverage [email or web address] Password [facility] 05/03/2017, 8:47 AM

## 2017-05-03 NOTE — Progress Notes (Signed)
MRI completed and reviewed. No acute changes including no evidence of acute stroke or bleed. No new recommendations from neurology. Please call with questions as needed.  -- Amie Portland, MD [facility] Pager: [phone] If 7pm to 7am, please call on call as listed on AMION.

## 2017-05-03 NOTE — Evaluation (Signed)
Clinical/Bedside Swallow Evaluation Patient Details  Name: Rodney Malone MRN: [id number] Date of Birth: 03-Jun-1922  Today's Date: 05/03/2017 Time: SLP Start Time (ACUTE ONLY): 0815 SLP Stop Time (ACUTE ONLY): 0845 SLP Time Calculation (min) (ACUTE ONLY): 30 min  Past Medical History:  Past Medical History:  Diagnosis Date  . Allergic rhinitis   . Atrial fibrillation ([place])   . CIDP (chronic inflammatory demyelinating polyneuropathy) ([place])   . Colonic polyp   . H/O: CVA (cerebrovascular accident)   . Hyperbilirubinemia   . Hyperlipidemia   . Long term (current) use of anticoagulants   . Mitral regurgitation   . Nephrolithiasis   . Nevus, atypical   . Osteopenia   . Recurrent boils   . Unsteady gait    Past Surgical History:  Past Surgical History:  Procedure Laterality Date  . APPENDECTOMY     HPI:  Rodney Malone a 84 y.o.malewithhistory of atrial fibrillation not on anticoagulation secondary to risk of bleed, severe mitral regurgitation, history of CIDP was brought to the ER after patient was found to be weak and dizzy. As per the patient's daughter patient usually uses a walker but today found very weak and had to be taken to the bathroom with help. After going to the bathroom patient found it difficult to get up and EMS was called and was brought to the ER. EMS also noticed that patient was mildly febrile.  Patient also was diagnosed with shingles 2 days ago and was started on Valtrex.  MRI of the brain is negative for acute findings.  Chest xray is showing small right pleural effusion with RML and basilar atelectasis or PNA.     Assessment / Plan / Recommendation Clinical Impression  Clinical swallowing evaluation was completed using thin liquids, pureed material and regular solids from the patient's breakfast tray.  The patient has a history of CIDP and nursing is reporting coughing with liquids.  Oral mechanism was completed and unremarkable.  The patient presented  with a possible oral and pharyngeal dysphagia.   The oral phase was marked by delayed oral transit.  No oral residue noted post swallow.  The pharyngeal phase was marked by a possibly delayed swallow trigger and wet gurly vocal quality following some thin liquid trials.   Recommend continue with current diet pending FEES to assess current swallowing physiology and to determine least restrictive diet.   SLP Visit Diagnosis: Dysphagia, oropharyngeal phase (R13.12)    Aspiration Risk  Moderate aspiration risk    Diet Recommendation  Continue current diet pending FEES   Medication Administration: Whole meds with puree    Other  Recommendations Oral Care Recommendations: Oral care BID   Follow up Recommendations Other (comment)(TBD)        Swallow Study   General Date of Onset: 04/30/17 HPI: Rodney Malone a 84 y.o.malewithhistory of atrial fibrillation not on anticoagulation secondary to risk of bleed, severe mitral regurgitation, history of CIDP was brought to the ER after patient was found to be weak and dizzy. As per the patient's daughter patient usually uses a walker but today found very weak and had to be taken to the bathroom with help. After going to the bathroom patient found it difficult to get up and EMS was called and was brought to the ER. EMS also noticed that patient was mildly febrile.  Patient also was diagnosed with shingles 2 days ago and was started on Valtrex.  MRI of the brain is negative for acute findings.  Chest xray is showing  small right pleural effusion with RML and basilar atelectasis or PNA.   Type of Study: Bedside Swallow Evaluation Previous Swallow Assessment: None noted at [facility]. Diet Prior to this Study: Regular;Thin liquids Temperature Spikes Noted: No History of Recent Intubation: No Behavior/Cognition: Alert;Cooperative;Requires cueing Oral Cavity Assessment: Dry Oral Care Completed by SLP: No Vision: Functional for self-feeding Self-Feeding  Abilities: Able to feed self;Needs assist Patient Positioning: Upright in bed Baseline Vocal Quality: Low vocal intensity Volitional Cough: Weak Volitional Swallow: Able to elicit    Oral/Motor/Sensory Function Overall Oral Motor/Sensory Function: Within functional limits   Ice Chips Ice chips: Not tested   Thin Liquid Thin Liquid: Impaired Presentation: Cup;Spoon;Self Fed Pharyngeal  Phase Impairments: Suspected delayed Swallow;Wet Vocal Quality    Nectar Thick Nectar Thick Liquid: Not tested   Honey Thick Honey Thick Liquid: Not tested   Puree Puree: Impaired Presentation: Spoon Pharyngeal Phase Impairments: Suspected delayed Swallow   Solid   GO   Solid: Impaired Presentation: Spoon;Self Fed Oral Phase Impairments: Impaired mastication Oral Phase Functional Implications: Prolonged oral transit Pharyngeal Phase Impairments: Suspected delayed Swallow       Shelly Flatten, MA, CCC-SLP Acute Rehab SLP [phone] Lamar Sprinkles 05/03/2017,9:47 AM

## 2017-05-04 ENCOUNTER — Inpatient Hospital Stay (HOSPITAL_COMMUNITY): Payer: Medicare Other

## 2017-05-04 DIAGNOSIS — R0603 Acute respiratory distress: Secondary | ICD-10-CM

## 2017-05-04 DIAGNOSIS — J90 Pleural effusion, not elsewhere classified: Secondary | ICD-10-CM

## 2017-05-04 DIAGNOSIS — R0602 Shortness of breath: Secondary | ICD-10-CM

## 2017-05-04 MED ORDER — IPRATROPIUM-ALBUTEROL 0.5-2.5 (3) MG/3ML IN SOLN
3.0000 mL | Freq: Four times a day (QID) | RESPIRATORY_TRACT | Status: DC
  Administered 2017-05-04: 3 mL via RESPIRATORY_TRACT
  Filled 2017-05-04: qty 3

## 2017-05-04 MED ORDER — IPRATROPIUM-ALBUTEROL 0.5-2.5 (3) MG/3ML IN SOLN: 3.0000 mL | Freq: Four times a day (QID) | RESPIRATORY_TRACT | Status: DC | PRN

## 2017-05-04 MED ORDER — GUAIFENESIN ER 600 MG PO TB12
600.0000 mg | ORAL_TABLET | Freq: Two times a day (BID) | ORAL | Status: DC
  Administered 2017-05-04 – 2017-05-08 (×8): 600 mg via ORAL
  Filled 2017-05-04 (×9): qty 1

## 2017-05-04 MED ORDER — IPRATROPIUM-ALBUTEROL 0.5-2.5 (3) MG/3ML IN SOLN
3.0000 mL | Freq: Three times a day (TID) | RESPIRATORY_TRACT | Status: DC
  Administered 2017-05-04 – 2017-05-07 (×8): 3 mL via RESPIRATORY_TRACT
  Filled 2017-05-04 (×8): qty 3

## 2017-05-04 MED ORDER — SODIUM CHLORIDE 3 % IN NEBU
4.0000 mL | INHALATION_SOLUTION | Freq: Every day | RESPIRATORY_TRACT | Status: AC
  Administered 2017-05-06: 4 mL via RESPIRATORY_TRACT
  Filled 2017-05-04 (×3): qty 4

## 2017-05-04 NOTE — Progress Notes (Signed)
PROGRESS NOTE  Rodney Malone EXH:[id number] DOB: Nov 10, 1922 DOA: 04/30/2017 PCP: [name], MD  HPI/Recap of past 24 hours: Rodney Malone is a 84 y.o. male with history of atrial fibrillation not on anticoagulation secondary to risk of bleed, severe mitral regurgitation, history of CIDP was brought to the ER after patient was found to be weak and dizzy.  As per the patient's daughter patient usually uses a walker but today found very weak and had to be taken to the bathroom with help.  After going to the bathroom patient found it difficult to get up and EMS was called and was brought to the ER.  EMS also noticed that patient was mildly febrile.  Patient also was diagnosed with shingles 2 days ago and was started on Valtrex. 05/03/17: Speech recommends dysphagia 3 with nectar thick liquid provided via cup. cxr revealed worsening right lobe infiltrates as reviewed by self with concern for aspiration. IV flagyl added for anaerobes coverage.  05/04/17: patient seen and examined. No new complaints. Spoke with the patient's son who will attempt to find the patient's advanced directives. Patient is alert but only oriented to self. Son consented on obtaining right thoracentesis.  Assessment/Plan: Principal Problem:   Sepsis ([place]) Active Problems:   Mitral regurgitation due to cusp prolapse   Atrial fibrillation (HCC)   CIDP (chronic inflammatory demyelinating polyneuropathy) (HCC)   Memory loss   Pressure injury of skin  HCAP, poa with suspected aspiration -05/04/17 continue IV cefepime and IV flagyl -swallow ruled in dysphagia -continue duonebs  -continue O2 supplement to maintain O2 sat 92% or greater -continuous O2 monitoring  Acute hypoxic respiratory failure -management as stated above  Right moderate pleural effusion -IR for right thoracentesis -continue O2 supplement  Dysphagia 3 -speech therapist recommends dysphagia 3 diet and nectar thick liquid -aspiration  precaution  Generalized weakness -r/o acute CVA -MRI brain negative -neurology signed off -increase po calorie intake as tolerated  Chronic a-fib -rate contrrolled -05/04/17 continue home meds -no anticoagulation due to hx of GI bleed on aspirirn  Recently diagnosed shingles -05/04/17 continue valtrex -airborne/contact precaution  Hx of GI bleed -monitor H&H -no overt sign of bleeding  Hx of severe MR -continue monitoring  Moderate protein calorie malnutrition -albumin 3.4 -nutritionist consult -05/04/17 continue oral supplement  Mild hyponatremia -na+ 134 -repeat BMP am  Eleveated AST/total bilirubin -continue to monitor for symptoms or signs of liver disease -repeat levels  Hyponatremia -most likely 2/2 to acute lung process -na+ 132 from 134 -repeat chemistry panel    Code Status: full  Family Communication: none at bedside  Disposition Plan: to be determined   Consultants:  neurology  Procedures:  none  Antimicrobials:  IV cefepime, IV flagyl  On valtrex for shingles   DVT prophylaxis:  sq lovenox   Objective: Vitals:   05/04/17 0549 05/04/17 0925 05/04/17 1149 05/04/17 1503  BP: 116/78 110/60  108/83  Pulse: 100 (!) 106  95  Resp: (!) 23   19  Temp: 97.6 F (36.4 C)  97.6 F (36.4 C) 97.6 F (36.4 C)  TempSrc: Axillary  Axillary Axillary  SpO2: 95%   95%  Weight: 69 kg (152 lb 1.9 oz)     Height:        Intake/Output Summary (Last 24 hours) at 05/04/2017 1702 Last data filed at 05/04/2017 0653 Gross per 24 hour  Intake 550 ml  Output 100 ml  Net 450 ml   Filed Weights   05/02/17 0618 05/03/17 0300 05/04/17  0549  Weight: 67 kg (147 lb 11.3 oz) 66.6 kg (146 lb 13.2 oz) 69 kg (152 lb 1.9 oz)    Exam: 05/04/17 patient seen and examined. Physical exam is unchanged from prior   General:  84 yo CM frail NAD alert and oriented to self  Cardiovascular: IRR no rubs or gallops   Respiratory: diffused rales. No  wheezes  Abdomen: soft NT ND NBS x4   Musculoskeletal: b/l LE 2+ edema; moves lower extremities but equal weakness noted   Skin: no rash noted  Psychiatry: mood is appropriate for condition and setting   Data Reviewed: CBC: Recent Labs  Lab 04/30/17 2247 04/30/17 2257 05/01/17 0052 05/01/17 0258 05/03/17 0943  WBC 14.9*  --  13.3* 13.0* 14.0*  NEUTROABS 11.1*  --   --  9.3*  --   HGB 15.0 15.6 13.2 13.1 13.1  HCT 43.8 46.0 39.5 39.9 39.9  MCV 94.6  --  93.8 94.1 94.5  PLT 308  --  290 288 409   Basic Metabolic Panel: Recent Labs  Lab 04/30/17 2247 04/30/17 2257 05/01/17 0052 05/01/17 0258 05/03/17 0943  NA 138 139  --  134* 132*  K 4.8 4.7  --  4.3 4.2  CL 105 103  --  105 105  CO2 22  --   --  20* 18*  GLUCOSE 144* 133*  --  135* 152*  BUN 19 23*  --  19 19  CREATININE 0.90 0.80 0.81 0.81 0.77  CALCIUM 9.8  --   --  8.9 9.0  MG  --   --  1.8  --   --    GFR: Estimated Creatinine Clearance: 57.5 mL/min (by C-G formula based on SCr of 0.77 mg/dL). Liver Function Tests: Recent Labs  Lab 04/30/17 2247 05/01/17 0258  AST 50* 58*  ALT 20 23  ALKPHOS 154* 134*  BILITOT 2.3* 2.0*  PROT 7.5 6.2*  ALBUMIN 4.1 3.4*   No results for input(s): LIPASE, AMYLASE in the last 168 hours. No results for input(s): AMMONIA in the last 168 hours. Coagulation Profile: Recent Labs  Lab 04/30/17 2247  INR 1.57   Cardiac Enzymes: Recent Labs  Lab 05/01/17 0052  CKTOTAL 186  TROPONINI 0.03*   BNP (last 3 results) No results for input(s): PROBNP in the last 8760 hours. HbA1C: No results for input(s): HGBA1C in the last 72 hours. CBG: Recent Labs  Lab 04/30/17 2251 05/03/17 0744 05/03/17 1143 05/03/17 1628 05/03/17 2119  GLUCAP 132* 106* 129* 122* 95   Lipid Profile: No results for input(s): CHOL, HDL, LDLCALC, TRIG, CHOLHDL, LDLDIRECT in the last 72 hours. Thyroid Function Tests: No results for input(s): TSH, T4TOTAL, FREET4, T3FREE, THYROIDAB in the  last 72 hours. Anemia Panel: No results for input(s): VITAMINB12, FOLATE, FERRITIN, TIBC, IRON, RETICCTPCT in the last 72 hours. Urine analysis:    Component Value Date/Time   COLORURINE YELLOW 05/01/2017 0000   APPEARANCEUR CLEAR 05/01/2017 0000   LABSPEC 1.024 05/01/2017 0000   PHURINE 5.0 05/01/2017 0000   GLUCOSEU NEGATIVE 05/01/2017 0000   HGBUR NEGATIVE 05/01/2017 0000   BILIRUBINUR NEGATIVE 05/01/2017 0000   KETONESUR NEGATIVE 05/01/2017 0000   PROTEINUR 30 (A) 05/01/2017 0000   UROBILINOGEN 0.2 08/09/2008 1949   NITRITE NEGATIVE 05/01/2017 0000   LEUKOCYTESUR NEGATIVE 05/01/2017 0000   Sepsis Labs: @LABRCNTIP (procalcitonin:4,lacticidven:4)  ) Recent Results (from the past 240 hour(s))  Blood Culture (routine x 2)     Status: None (Preliminary result)   Collection Time:  04/30/17 11:40 PM  Result Value Ref Range Status   Specimen Description BLOOD LEFT WRIST  Final   Special Requests   Final    BOTTLES DRAWN AEROBIC AND ANAEROBIC Blood Culture adequate volume   Culture   Final    NO GROWTH 3 DAYS Performed at [facility], [address] [place], [place] [address]    Report Status PENDING  Incomplete  Blood Culture (routine x 2)     Status: None (Preliminary result)   Collection Time: 04/30/17 11:50 PM  Result Value Ref Range Status   Specimen Description BLOOD RIGHT HAND  Final   Special Requests AEROBIC BOTTLE ONLY Blood Culture adequate volume  Final   Culture   Final    NO GROWTH 3 DAYS Performed at [facility], [place] [address], [place], [place] [address]    Report Status PENDING  Incomplete  MRSA PCR Screening     Status: None   Collection Time: 05/03/17  6:12 PM  Result Value Ref Range Status   MRSA by PCR NEGATIVE NEGATIVE Final    Comment:        The GeneXpert MRSA Assay (FDA approved for NASAL specimens only), is one component of a comprehensive MRSA colonization surveillance program. It is not intended to diagnose MRSA infection nor  to guide or monitor treatment for MRSA infections. Performed at [facility], [place] [address] [place], [place] [address]       Studies: Dg Chest Port 1 View  Result Date: 05/04/2017 CLINICAL DATA:  Shortness of breath. EXAM: PORTABLE CHEST 1 VIEW COMPARISON:  Chest x-ray dated April 30, 2017. FINDINGS: Stable cardiomegaly. Mild pulmonary vascular congestion, unchanged. Interval increase in size of now moderate right pleural effusion with hazy opacity in the right mid and lower lungs and partial collapse of the right lower lobe. No pneumothorax. No acute osseous abnormality. IMPRESSION: 1. Increasing size of now moderate right pleural effusion with worsening right middle and lower lobe atelectasis/infiltrate. Electronically Signed   By: Titus Dubin M.D.   On: 05/04/2017 08:34    Scheduled Meds: . aspirin EC  81 mg Oral Daily  . enoxaparin (LOVENOX) injection  40 mg Subcutaneous Q24H  . ipratropium-albuterol  3 mL Nebulization TID  . metoprolol succinate  100 mg Oral Daily  . valACYclovir  1,000 mg Oral TID    Continuous Infusions: . ceFEPime (MAXIPIME) IV Stopped (05/04/17 8466)  . metronidazole Stopped (05/04/17 0926)  . vancomycin Stopped (05/04/17 0800)     LOS: 3 days     Kayleen Memos, MD [facility] Pager [phone]  If 7PM-7AM, please contact night-coverage [email or web address] Password [facility] 05/04/2017, 5:02 PM

## 2017-05-04 NOTE — Plan of Care (Signed)
  Progressing Safety: Ability to remain free from injury will improve 05/04/2017 0144 - Progressing by Colonel Bald, RN Note Bed alarm on, mittens PRN, call light within reach, hourly rounding, room near nurses station. Skin Integrity: Risk for impaired skin integrity will decrease 05/04/2017 0144 - Progressing by Colonel Bald, RN Note Sacral foam dressing on, bilateral heel protectors on, turn every 2 hours, heels elevated off bed.

## 2017-05-05 ENCOUNTER — Inpatient Hospital Stay (HOSPITAL_COMMUNITY): Payer: Medicare Other

## 2017-05-05 DIAGNOSIS — Z9889 Other specified postprocedural states: Secondary | ICD-10-CM

## 2017-05-05 LAB — CBC
HCT: 41.4 % (ref 39.0–52.0)
Hemoglobin: 13.5 g/dL (ref 13.0–17.0)
MCH: 30.8 pg (ref 26.0–34.0)
MCHC: 32.6 g/dL (ref 30.0–36.0)
MCV: 94.5 fL (ref 78.0–100.0)
PLATELETS: 314 10*3/uL (ref 150–400)
RBC: 4.38 MIL/uL (ref 4.22–5.81)
RDW: 16.2 % — ABNORMAL HIGH (ref 11.5–15.5)
WBC: 12.3 10*3/uL — ABNORMAL HIGH (ref 4.0–10.5)

## 2017-05-05 LAB — BASIC METABOLIC PANEL
Anion gap: 8 (ref 5–15)
BUN: 16 mg/dL (ref 6–20)
CO2: 23 mmol/L (ref 22–32)
CREATININE: 0.75 mg/dL (ref 0.61–1.24)
Calcium: 9.2 mg/dL (ref 8.9–10.3)
Chloride: 105 mmol/L (ref 101–111)
GFR calc Af Amer: >60 mL/min (ref >60)
GLUCOSE: 119 mg/dL — AB (ref 65–99)
Potassium: 4 mmol/L (ref 3.5–5.1)
SODIUM: 136 mmol/L (ref 135–145)

## 2017-05-05 LAB — GRAM STAIN

## 2017-05-05 LAB — GLUCOSE, PLEURAL OR PERITONEAL FLUID: Glucose, Fluid: 109 mg/dL

## 2017-05-05 LAB — PROTEIN, PLEURAL OR PERITONEAL FLUID: Total protein, fluid: <3 g/dL

## 2017-05-05 LAB — BODY FLUID CELL COUNT WITH DIFFERENTIAL
Eos, Fluid: 0 %
LYMPHS FL: 54 %
Monocyte-Macrophage-Serous Fluid: 9 % — ABNORMAL LOW (ref 50–90)
NEUTROPHIL FLUID: 37 % — AB (ref 0–25)
WBC FLUID: 329 uL (ref 0–1000)

## 2017-05-05 LAB — GLUCOSE, CAPILLARY
GLUCOSE-CAPILLARY: 104 mg/dL — AB (ref 65–99)
Glucose-Capillary: 98 mg/dL (ref 65–99)
Glucose-Capillary: 122 mg/dL — ABNORMAL HIGH (ref 65–99)

## 2017-05-05 LAB — LACTATE DEHYDROGENASE, PLEURAL OR PERITONEAL FLUID: LD, Fluid: 70 U/L — ABNORMAL HIGH (ref 3–23)

## 2017-05-05 MED ORDER — STARCH (THICKENING) PO POWD
ORAL | Status: DC | PRN
  Filled 2017-05-05: qty 227

## 2017-05-05 MED ORDER — LIDOCAINE HCL (PF) 1 % IJ SOLN
INTRAMUSCULAR | Status: AC
  Filled 2017-05-05: qty 30

## 2017-05-05 NOTE — Procedures (Signed)
PROCEDURE SUMMARY: Patient brought to radiology department.  Patient arrived wearing a blue mask, but is noted to be on airborne precautions.  Called up to RN who states patient ok to leave the unit as his airborne precautions are for shingles. Proceeded with procedure.  Successful US guided right diagnostic and therapeutic thoracentesis. Yielded 1.8 liters of yellow fluid. Pt tolerated procedure well. No immediate complications.  Specimen was sent for labs. CXR ordered.  Docia Barrier PA-C 05/05/2017 1:13 PM

## 2017-05-05 NOTE — Progress Notes (Signed)
PROGRESS NOTE  Rodney Malone OMV:[id number] DOB: [birth year] DOA: 04/30/2017 PCP: [name], MD  HPI/Recap of past 24 hours: Rodney Malone is a 84 y.o. male with history of atrial fibrillation not on anticoagulation secondary to risk of bleed, severe mitral regurgitation, history of CIDP was brought to the ER after patient was found to be weak and dizzy.  As per the patient's daughter patient usually uses a walker but today found very weak and had to be taken to the bathroom with help.  After going to the bathroom patient found it difficult to get up and EMS was called and was brought to the ER.  EMS also noticed that patient was mildly febrile.  Patient also was diagnosed with shingles 2 days ago and was started on Valtrex. 05/03/17: Speech recommends dysphagia 3 with nectar thick liquid provided via cup. cxr revealed worsening right lobe infiltrates as reviewed by self with concern for aspiration. IV flagyl added for anaerobes coverage.  05/04/17: patient seen and examined. No new complaints. Spoke with the patient's son who will attempt to find the patient's advanced directives. Patient is alert but only oriented to self. Son consented on obtaining right thoracentesis.  05/05/17: No new complaints.  Assessment/Plan: Principal Problem:   Sepsis ([place]) Active Problems:   Mitral regurgitation due to cusp prolapse   Atrial fibrillation (HCC)   CIDP (chronic inflammatory demyelinating polyneuropathy) (HCC)   Memory loss   Pressure injury of skin  HCAP, poa with suspected aspiration -05/05/17 continue IV cefepime and IV flagyl -swallow ruled in dysphagia -continue duonebs  -continue O2 supplement to maintain O2 sat 92% or greater -continuous O2 monitoring  Acute hypoxic respiratory failure -management as stated above  Right moderate pleural effusion -IR for right thoracentesis -continue O2 supplement  Dysphagia 3 -speech therapist recommends dysphagia 3 diet and nectar thick  liquid -aspiration precaution  Generalized weakness/physical debility -r/o acute CVA -MRI brain negative -neurology signed off -05/05/17 continue to increase po calorie intake as tolerated -OOB to chair with every shift -PT when more stable  Chronic a-fib -rate contrrolled -05/05/17 continue home meds -no anticoagulation due to hx of GI bleed on aspirirn  Recently diagnosed shingles -05/05/17 continue valtrex -airborne/contact precaution  Hx of GI bleed -monitor H&H -no overt sign of bleeding  Hx of severe MR -continue monitoring  Moderate protein calorie malnutrition -albumin 3.4 -nutritionist consult -05/05/17 continue oral supplement  Mild hyponatremia, resolved -most likely 2/2 to acute lung process -na+ 136 from 134 -repeat BMP am  Eleveated AST/total bilirubin -05/05/17 continue to monitor for symptoms or signs of liver disease -repeat levels -ast 58 from 50 -alk phos 134 from 150    Code Status: full  Family Communication: none at bedside  Disposition Plan: to be determined   Consultants:  neurology  Procedures:  none  Antimicrobials:  IV cefepime, IV flagyl  On valtrex for shingles   DVT prophylaxis:  sq lovenox   Objective: Vitals:   05/05/17 1240 05/05/17 1301 05/05/17 1400 05/05/17 1508  BP: (!) 112/59 (!) 95/56 (!) 107/59   Pulse: 78 93 89   Resp: (!) 24 (!) 24 19   Temp:   (!) 97.5 F (36.4 C)   TempSrc:   Oral   SpO2: (!) 89% 90% 97% 98%  Weight:      Height:        Intake/Output Summary (Last 24 hours) at 05/05/2017 1742 Last data filed at 05/05/2017 0607 Gross per 24 hour  Intake 160 ml  Output  500 ml  Net -340 ml   Filed Weights   05/03/17 0300 05/04/17 0549 05/05/17 0604  Weight: 66.6 kg (146 lb 13.2 oz) 69 kg (152 lb 1.9 oz) 69.5 kg (153 lb 3.5 oz)    Exam: 05/05/17 patient seen and examined. Physical exam is unchanged from prior   General:  84 yo CM frail NAD alert and oriented to self  Cardiovascular:  IRR no rubs or gallops   Respiratory: diffused rales. No wheezes  Abdomen: soft NT ND NBS x4   Musculoskeletal: b/l LE 2+ edema; moves lower extremities but equal weakness noted   Skin: no rash noted  Psychiatry: mood is appropriate for condition and setting   Data Reviewed: CBC: Recent Labs  Lab 04/30/17 2247 04/30/17 2257 05/01/17 0052 05/01/17 0258 05/03/17 0943 05/05/17 0852  WBC 14.9*  --  13.3* 13.0* 14.0* 12.3*  NEUTROABS 11.1*  --   --  9.3*  --   --   HGB 15.0 15.6 13.2 13.1 13.1 13.5  HCT 43.8 46.0 39.5 39.9 39.9 41.4  MCV 94.6  --  93.8 94.1 94.5 94.5  PLT 308  --  290 288 263 027   Basic Metabolic Panel: Recent Labs  Lab 04/30/17 2247 04/30/17 2257 05/01/17 0052 05/01/17 0258 05/03/17 0943 05/05/17 0852  NA 138 139  --  134* 132* 136  K 4.8 4.7  --  4.3 4.2 4.0  CL 105 103  --  105 105 105  CO2 22  --   --  20* 18* 23  GLUCOSE 144* 133*  --  135* 152* 119*  BUN 19 23*  --  _0 CREATININE 0.90 0.80 0.81 0.81 0.77 0.75  CALCIUM 9.8  --   --  8.9 9.0 9.2  MG  --   --  1.8  --   --   --    GFR: Estimated Creatinine Clearance: 57.9 mL/min (by C-G formula based on SCr of 0.75 mg/dL). Liver Function Tests: Recent Labs  Lab 04/30/17 2247 05/01/17 0258  AST 50* 58*  ALT 20 23  ALKPHOS 154* 134*  BILITOT 2.3* 2.0*  PROT 7.5 6.2*  ALBUMIN 4.1 3.4*   No results for input(s): LIPASE, AMYLASE in the last 168 hours. No results for input(s): AMMONIA in the last 168 hours. Coagulation Profile: Recent Labs  Lab 04/30/17 2247  INR 1.57   Cardiac Enzymes: Recent Labs  Lab 05/01/17 0052  CKTOTAL 186  TROPONINI 0.03*   BNP (last 3 results) No results for input(s): PROBNP in the last 8760 hours. HbA1C: No results for input(s): HGBA1C in the last 72 hours. CBG: Recent Labs  Lab 05/03/17 1628 05/03/17 2119 05/05/17 0727 05/05/17 1157 05/05/17 1651  GLUCAP 122* 95 98 122* 104*   Lipid Profile: No results for input(s): CHOL, HDL,  LDLCALC, TRIG, CHOLHDL, LDLDIRECT in the last 72 hours. Thyroid Function Tests: No results for input(s): TSH, T4TOTAL, FREET4, T3FREE, THYROIDAB in the last 72 hours. Anemia Panel: No results for input(s): VITAMINB12, FOLATE, FERRITIN, TIBC, IRON, RETICCTPCT in the last 72 hours. Urine analysis:    Component Value Date/Time   COLORURINE YELLOW 05/01/2017 0000   APPEARANCEUR CLEAR 05/01/2017 0000   LABSPEC 1.024 05/01/2017 0000   PHURINE 5.0 05/01/2017 0000   GLUCOSEU NEGATIVE 05/01/2017 0000   HGBUR NEGATIVE 05/01/2017 0000   BILIRUBINUR NEGATIVE 05/01/2017 0000   KETONESUR NEGATIVE 05/01/2017 0000   PROTEINUR 30 (A) 05/01/2017 0000   UROBILINOGEN 0.2 08/09/2008 1949   NITRITE NEGATIVE  05/01/2017 0000   LEUKOCYTESUR NEGATIVE 05/01/2017 0000   Sepsis Labs: _0 (procalcitonin:4,lacticidven:4)  ) Recent Results (from the past 240 hour(s))  Blood Culture (routine x 2)     Status: None (Preliminary result)   Collection Time: 04/30/17 11:40 PM  Result Value Ref Range Status   Specimen Description BLOOD LEFT WRIST  Final   Special Requests   Final    BOTTLES DRAWN AEROBIC AND ANAEROBIC Blood Culture adequate volume   Culture   Final    NO GROWTH 4 DAYS Performed at [facility], [place] [address], [place], [place] [address]    Report Status PENDING  Incomplete  Blood Culture (routine x 2)     Status: None (Preliminary result)   Collection Time: 04/30/17 11:50 PM  Result Value Ref Range Status   Specimen Description BLOOD RIGHT HAND  Final   Special Requests AEROBIC BOTTLE ONLY Blood Culture adequate volume  Final   Culture   Final    NO GROWTH 4 DAYS Performed at [facility], [place] [address], [place], [place] [address]    Report Status PENDING  Incomplete  MRSA PCR Screening     Status: None   Collection Time: 05/03/17  6:12 PM  Result Value Ref Range Status   MRSA by PCR NEGATIVE NEGATIVE Final    Comment:        The GeneXpert MRSA Assay (FDA approved for  NASAL specimens only), is one component of a comprehensive MRSA colonization surveillance program. It is not intended to diagnose MRSA infection nor to guide or monitor treatment for MRSA infections. Performed at [facility], [place] [address] [place], [place] [address]       Studies: Dg Chest Port 1 View  Result Date: 05/05/2017 CLINICAL DATA:  Followup right thoracentesis. EXAM: PORTABLE CHEST 1 VIEW COMPARISON:  05/04/2017 FINDINGS: Cardiomegaly. Aortic atherosclerosis. Marked reduction in pleural fluid on the right. Mild bibasilar atelectasis noted today. No pneumothorax. IMPRESSION: Marked reduction and pleural fluid on the right following pneumothorax. Bibasilar atelectasis. No pneumothorax. Cardiomegaly and aortic atherosclerosis. Electronically Signed   By: Nelson Chimes M.D.   On: 05/05/2017 14:21    Scheduled Meds: . aspirin EC  81 mg Oral Daily  . enoxaparin (LOVENOX) injection  40 mg Subcutaneous Q24H  . guaiFENesin  600 mg Oral BID  . ipratropium-albuterol  3 mL Nebulization TID  . lidocaine (PF)      . metoprolol succinate  100 mg Oral Daily  . sodium chloride HYPERTONIC  4 mL Nebulization Daily  . valACYclovir  1,000 mg Oral TID    Continuous Infusions: . ceFEPime (MAXIPIME) IV 1 g (05/05/17 1705)  . metronidazole Stopped (05/05/17 1005)     LOS: 4 days     Kayleen Memos, MD [facility] Pager [phone]  If 7PM-7AM, please contact night-coverage [email or web address] Password [facility] 05/05/2017, 5:42 PM

## 2017-05-05 NOTE — Progress Notes (Signed)
SLP Cancellation Note  Patient Details Name: Rodney Malone MRN: [id number] DOB: 04-24-1922   Cancelled treatment:       Reason Eval/Treat Not Completed: lethargy limiting ability to participate.  Spoke at length with son, Rodney Malone, who is considering involving hospice.  SLP will f/u next date - doubt we will pursue MBS, but will plan to educate family re: methods of feeding, managing swallowing.     [name] 05/05/2017, 4:00 PM

## 2017-05-06 DIAGNOSIS — R5381 Other malaise: Secondary | ICD-10-CM

## 2017-05-06 DIAGNOSIS — Z7189 Other specified counseling: Secondary | ICD-10-CM

## 2017-05-06 DIAGNOSIS — R1310 Dysphagia, unspecified: Secondary | ICD-10-CM

## 2017-05-06 DIAGNOSIS — Z515 Encounter for palliative care: Secondary | ICD-10-CM

## 2017-05-06 DIAGNOSIS — J690 Pneumonitis due to inhalation of food and vomit: Secondary | ICD-10-CM

## 2017-05-06 LAB — CULTURE, BLOOD (ROUTINE X 2)
CULTURE: NO GROWTH
Culture: NO GROWTH
SPECIAL REQUESTS: ADEQUATE
Special Requests: ADEQUATE

## 2017-05-06 LAB — PATHOLOGIST SMEAR REVIEW

## 2017-05-06 MED ORDER — LORAZEPAM 2 MG/ML IJ SOLN
0.5000 mg | Freq: Once | INTRAMUSCULAR | Status: AC
  Administered 2017-05-06: 0.5 mg via INTRAVENOUS
  Filled 2017-05-06: qty 1

## 2017-05-06 MED ORDER — HALOPERIDOL LACTATE 5 MG/ML IJ SOLN
2.0000 mg | Freq: Every evening | INTRAMUSCULAR | Status: DC | PRN
  Administered 2017-05-06: 2 mg via INTRAVENOUS
  Filled 2017-05-06: qty 1

## 2017-05-06 NOTE — Consult Note (Addendum)
            [facility] CM Primary Care Navigator  05/06/2017  Rodney Malone [birth year] [id number]   Went to seepatient at the bedside to identify possible discharge needsbuthe was unable to answer questions. Patientnoted withmemory and physical impairment.  Per Inpatient social worker note, patient and his wife have lived at [facility] facility for 8 years and had been in rehab at [facility] in the past. His daughter is available to help with disposition planning pending patient's progress. Palliative care consult in place for patient.  Per therapy recommendation, patient will benefit from skilled nursing facility placement at discharge.  Noted no current health management needs has been identified at this point.   For additional questions please contact:  Edwena Felty A. Martesha Niedermeier, BSN, RN-BC [facility] PRIMARY CARE Navigator Cell: [phone]

## 2017-05-06 NOTE — Progress Notes (Signed)
PROGRESS NOTE  Rodney Malone JQG:[id number] DOB: [birth year] DOA: 04/30/2017 PCP: [name], MD  HPI/Recap of past 24 hours: Rodney Malone is a 84 y.o. male with history of atrial fibrillation not on anticoagulation secondary to risk of bleed, severe mitral regurgitation, history of CIDP was brought to the ER after patient was found to be weak and dizzy.  As per the patient's daughter patient usually uses a walker but today found very weak and had to be taken to the bathroom with help.  After going to the bathroom patient found it difficult to get up and EMS was called and was brought to the ER.  EMS also noticed that patient was mildly febrile.  Patient also was diagnosed with shingles 2 days ago and was started on Valtrex. 05/03/17: Speech recommends dysphagia 3 with nectar thick liquid provided via cup. cxr revealed worsening right lobe infiltrates as reviewed by self with concern for aspiration. IV flagyl added for anaerobes coverage.  05/04/17: patient seen and examined. No new complaints. Spoke with the patient's son who will attempt to find the patient's advanced directives. Patient is alert but only oriented to self. Son consented on obtaining right thoracentesis.  05/06/17: POD#1 post right thoracentesis by IR with 1.8L fluid removed. Patient states feels better. O2 sat 100% on 3L. No new complaints. Spoke with the patient's daughter on the phone and updated on current medical condition. Palliative care has been consulted to aid family in making decision regarding goals of care.   Assessment/Plan: Principal Problem:   Sepsis ([place]) Active Problems:   Mitral regurgitation due to cusp prolapse   Atrial fibrillation (HCC)   CIDP (chronic inflammatory demyelinating polyneuropathy) (HCC)   Memory loss   Pressure injury of skin  HCAP, poa with suspected aspiration -05/06/17 continue IV cefepime and IV flagyl -swallow ruled in dysphagia -continue duonebs  -continue O2 supplement to  maintain O2 sat 92% or greater -continuous O2 monitoring -05/06/17 continue pulmonary toilet  Left groin Shingles, poa -continue antiviral -not all lesions are completely crusty -continue to monitor -continue precautions  Acute hypoxic respiratory failure -management as stated above  Right moderate pleural effusion, post right thoracentesis (05/05/17) -IR for right thoracentesis -1.8L fluid removed-sent for cultures -continue O2 supplement  Dysphagia 3 -speech therapist recommends dysphagia 3 diet and nectar thick liquid -aspiration precaution  Generalized weakness/physical debility -r/o acute CVA -MRI brain negative -neurology signed off -05/06/17 continue to increase po calorie intake as tolerated -OOB to chair with every shift -PT assess and treat -Patient used a walker   Chronic a-fib -rate contrrolled -05/06/17 continue home meds -no anticoagulation due to hx of GI bleed on aspirirn  Recently diagnosed shingles -05/06/17 continue valtrex -airborne/contact precaution  Hx of GI bleed -monitor H&H -no overt sign of bleeding  Hx of severe MR -continue monitoring  Moderate protein calorie malnutrition -albumin 3.4 -nutritionist consult -05/06/17 continue oral supplement  Mild hyponatremia, resolved -most likely 2/2 to acute lung process -na+ 136 from 134 -repeat BMP am  Eleveated AST/total bilirubin -05/06/17 continue to monitor for symptoms or signs of liver disease -repeat levels -ast 58 from 50 -alk phos 134 from 150    Code Status: full  Family Communication: none at bedside  Disposition Plan: to be determined   Consultants:  neurology  Procedures:  none  Antimicrobials:  IV cefepime, IV flagyl  On valtrex for shingles   DVT prophylaxis:  sq lovenox   Objective: Vitals:   05/05/17 2113 05/06/17 0346 05/06/17 1300 05/06/17 1602  BP:  115/78 99/65   Pulse:  (!) 102 97   Resp:  20 18   Temp:  (!) 97 F (36.1 C) (!) 97.5 F  (36.4 C)   TempSrc:  Axillary Axillary   SpO2: 95% 99% 100% 93%  Weight:      Height:        Intake/Output Summary (Last 24 hours) at 05/06/2017 1611 Last data filed at 05/06/2017 0348 Gross per 24 hour  Intake 640 ml  Output 450 ml  Net 190 ml   Filed Weights   05/03/17 0300 05/04/17 0549 05/05/17 0604  Weight: 66.6 kg (146 lb 13.2 oz) 69 kg (152 lb 1.9 oz) 69.5 kg (153 lb 3.5 oz)    Exam: 05/06/17 patient seen and examined. Physical exam is unchanged from prior   General:  84 yo CM frail NAD alert and oriented to self  Cardiovascular: IRR no rubs or gallops   Respiratory: diffused rales. No wheezes  Abdomen: soft NT ND NBS x4   Musculoskeletal: b/l LE 2+ edema; moves lower extremities but equal weakness noted   Skin: left groin shingles radiating to the back, lesions that do not cross the midline-multistage-not all dry.  Psychiatry: mood is appropriate for condition and setting   Data Reviewed: CBC: Recent Labs  Lab 04/30/17 2247 04/30/17 2257 05/01/17 0052 05/01/17 0258 05/03/17 0943 05/05/17 0852  WBC 14.9*  --  13.3* 13.0* 14.0* 12.3*  NEUTROABS 11.1*  --   --  9.3*  --   --   HGB 15.0 15.6 13.2 13.1 13.1 13.5  HCT 43.8 46.0 39.5 39.9 39.9 41.4  MCV 94.6  --  93.8 94.1 94.5 94.5  PLT 308  --  290 288 263 631   Basic Metabolic Panel: Recent Labs  Lab 04/30/17 2247 04/30/17 2257 05/01/17 0052 05/01/17 0258 05/03/17 0943 05/05/17 0852  NA 138 139  --  134* 132* 136  K 4.8 4.7  --  4.3 4.2 4.0  CL 105 103  --  105 105 105  CO2 22  --   --  20* 18* 23  GLUCOSE 144* 133*  --  135* 152* 119*  BUN 19 23*  --  _0 CREATININE 0.90 0.80 0.81 0.81 0.77 0.75  CALCIUM 9.8  --   --  8.9 9.0 9.2  MG  --   --  1.8  --   --   --    GFR: Estimated Creatinine Clearance: 57.9 mL/min (by C-G formula based on SCr of 0.75 mg/dL). Liver Function Tests: Recent Labs  Lab 04/30/17 2247 05/01/17 0258  AST 50* 58*  ALT 20 23  ALKPHOS 154* 134*  BILITOT  2.3* 2.0*  PROT 7.5 6.2*  ALBUMIN 4.1 3.4*   No results for input(s): LIPASE, AMYLASE in the last 168 hours. No results for input(s): AMMONIA in the last 168 hours. Coagulation Profile: Recent Labs  Lab 04/30/17 2247  INR 1.57   Cardiac Enzymes: Recent Labs  Lab 05/01/17 0052  CKTOTAL 186  TROPONINI 0.03*   BNP (last 3 results) No results for input(s): PROBNP in the last 8760 hours. HbA1C: No results for input(s): HGBA1C in the last 72 hours. CBG: Recent Labs  Lab 05/03/17 1628 05/03/17 2119 05/05/17 0727 05/05/17 1157 05/05/17 1651  GLUCAP 122* 95 98 122* 104*   Lipid Profile: No results for input(s): CHOL, HDL, LDLCALC, TRIG, CHOLHDL, LDLDIRECT in the last 72 hours. Thyroid Function Tests: No results for input(s): TSH, T4TOTAL, FREET4, T3FREE,  THYROIDAB in the last 72 hours. Anemia Panel: No results for input(s): VITAMINB12, FOLATE, FERRITIN, TIBC, IRON, RETICCTPCT in the last 72 hours. Urine analysis:    Component Value Date/Time   COLORURINE YELLOW 05/01/2017 0000   APPEARANCEUR CLEAR 05/01/2017 0000   LABSPEC 1.024 05/01/2017 0000   PHURINE 5.0 05/01/2017 0000   GLUCOSEU NEGATIVE 05/01/2017 0000   HGBUR NEGATIVE 05/01/2017 0000   BILIRUBINUR NEGATIVE 05/01/2017 0000   KETONESUR NEGATIVE 05/01/2017 0000   PROTEINUR 30 (A) 05/01/2017 0000   UROBILINOGEN 0.2 08/09/2008 1949   NITRITE NEGATIVE 05/01/2017 0000   LEUKOCYTESUR NEGATIVE 05/01/2017 0000   Sepsis Labs: _0 (procalcitonin:4,lacticidven:4)  ) Recent Results (from the past 240 hour(s))  Blood Culture (routine x 2)     Status: None   Collection Time: 04/30/17 11:40 PM  Result Value Ref Range Status   Specimen Description BLOOD LEFT WRIST  Final   Special Requests   Final    BOTTLES DRAWN AEROBIC AND ANAEROBIC Blood Culture adequate volume   Culture   Final    NO GROWTH 5 DAYS Performed at [facility], [address], [place], [place] [address]    Report Status 05/06/2017 FINAL   Final  Blood Culture (routine x 2)     Status: None   Collection Time: 04/30/17 11:50 PM  Result Value Ref Range Status   Specimen Description BLOOD RIGHT HAND  Final   Special Requests AEROBIC BOTTLE ONLY Blood Culture adequate volume  Final   Culture   Final    NO GROWTH 5 DAYS Performed at [facility], [place] [address] [place], [place] [address]    Report Status 05/06/2017 FINAL  Final  MRSA PCR Screening     Status: None   Collection Time: 05/03/17  6:12 PM  Result Value Ref Range Status   MRSA by PCR NEGATIVE NEGATIVE Final    Comment:        The GeneXpert MRSA Assay (FDA approved for NASAL specimens only), is one component of a comprehensive MRSA colonization surveillance program. It is not intended to diagnose MRSA infection nor to guide or monitor treatment for MRSA infections. Performed at [facility], [place] [address] [place], [place] [address]   Culture, body fluid-bottle     Status: None (Preliminary result)   Collection Time: 05/05/17  1:00 PM  Result Value Ref Range Status   Specimen Description PLEURAL RIGHT  Final   Special Requests NONE  Final   Culture   Final    NO GROWTH < 24 HOURS Performed at [facility], [place] [address] [place], [place] [address]    Report Status PENDING  Incomplete  Gram stain     Status: None   Collection Time: 05/05/17  1:00 PM  Result Value Ref Range Status   Specimen Description PLEURAL RIGHT  Final   Special Requests NONE  Final   Gram Stain   Final    MODERATE WBC PRESENT,BOTH PMN AND MONONUCLEAR NO ORGANISMS SEEN Performed at [facility], [address] [place], [place] [address]    Report Status 05/05/2017 FINAL  Final      Studies: No results found.  Scheduled Meds: . aspirin EC  81 mg Oral Daily  . enoxaparin (LOVENOX) injection  40 mg Subcutaneous Q24H  . guaiFENesin  600 mg Oral BID  . ipratropium-albuterol  3 mL Nebulization TID  . metoprolol succinate  100 mg Oral Daily  .  sodium chloride HYPERTONIC  4 mL Nebulization Daily  . valACYclovir  1,000 mg Oral TID    Continuous Infusions: . ceFEPime (MAXIPIME) IV Stopped (05/06/17 0558)  . metronidazole Stopped (05/06/17 1200)     LOS: 5 days     Kayleen Memos, MD [facility] Pager [phone]  If 7PM-7AM, please contact night-coverage [email or web address] Password [email or web address] 05/06/2017, 4:11 PM

## 2017-05-06 NOTE — Progress Notes (Signed)
Palliative:  Full note to follow. Long discussion with wife, son, daughter (patient somnolent today and unable to participate). Fully discussed dysphagia and aspiration risk and overall poor prognosis. Explained concern with poor intake as he is already frail with muscle wasting. Very therapeutic conversation. Discussed code status and much discussion - will follow up for clarity tomorrow.   I have ordered haldol 2 mg IV qhs prn agitation. PLEASE AVOID ATIVAN (or at least give only 0.25 mg if absolutely necessary). He is extremely lethargic today and RN/family believe this could be resultant of Ativan received last night as he was much more alert and interactive yesterday.   Vinie Sill, NP Palliative Medicine Team Pager # [phone] (M-F 8a-5p) Team Phone # [phone] (Nights/Weekends)

## 2017-05-06 NOTE — Consult Note (Signed)
Consultation Note Date: 05/06/2017   Patient Name: Rodney Malone  DOB: Mar 30, 1922  MRN: [id number]  Age / Sex: 84 y.o., male  PCP: [name], MD Referring Physician: Kayleen Memos, DO  Reason for Consultation: Establishing goals of care  HPI/Patient Profile: 84 y.o. male  with past medical history of CIPD (considered to be in remission per neurology notes 03/25/17), AF on aspirin (no anticoagulation d/t risk falls), h/o CVA 2004, mitral regurgitation, h/o tobacco abuse admitted on 04/30/2017 from [facility] with weakness, dizziness and treated for sepsis pneumonia thought to be from aspiration. Also recently diagnosed at facility with shingles and started on Valtrex. GOC requested d/t dysphagia and frailty.   Clinical Assessment and Goals of Care: Long discussion with wife, son, daughter (patient somnolent today and unable to participate). Fully discussed dysphagia and aspiration risk and overall poor prognosis. Explained concern with poor intake as he is already frail with muscle wasting. Very therapeutic conversation. Discussed code status and much discussion - will follow up for clarity tomorrow (no clear resolution but I do believe DNR would be very appropriate).   I have ordered haldol 2 mg IV qhs prn agitation. PLEASE AVOID ATIVAN (or at least give only 0.25 mg if absolutely necessary). He is extremely lethargic today and RN/family believe this could be resultant of Ativan received last night as he was much more alert and interactive yesterday.   We were able to discuss EOL plans for Rodney Malone and although he may be improving acutely from pneumonia he continues with aspiration risk and high risk for very poor nutrition. Explained that I continue to believe prognosis is very poor. Family is tearful as they are able to share their thoughts and fears surrounding Rodney Malone dying. Daughter Rodney Malone also  struggling with an international trip that she has had planned leaving Sunday - after much discussion she plans to postpone her trip. They are hopeful to have Rodney Malone d/c to [facility] rehab by Friday. We did discuss transition to hospice care at [facility]. Emotional support provided. I will follow up tomorrow.   Primary Decision Maker HCPOA wife Bonnita Malone and then children [employer] and Rodney Malone    SUMMARY OF RECOMMENDATIONS   - Avoid Ativan - Transition back to [facility] (to rehab) ASAP (will need palliative follow up at minimum and potentially hospice)  Code Status/Advance Care Planning:  Full code - will clarify tomorrow   Symptom Management:   Trial Haldol 2 mg IV qhs prn to see if he tolerates and if this is effective. On telemetry. Try to avoid Ativan if possible (or at least minimal dose if absolutely necessary). Will reassess tomorrow. Wife says he gets up at night with foot pain r/t CIDP and she rubs his feet. Could consider small dose gabapentin qhs as well and even Tylenol.   Palliative Prophylaxis:   Aspiration, Delirium Protocol, Frequent Pain Assessment, Oral Care and Turn Reposition   Psycho-social/Spiritual:   Desire for further Chaplaincy support:yes  Additional Recommendations: Caregiving  Support/Resources,  Education on Hospice and Grief/Bereavement Support  Prognosis:   TBD. Very likely < 6 months and likely much less depending on dysphagia and intake.   Discharge Planning: To Be Determined      Primary Diagnoses: Present on Admission: . Sepsis ([place]) . Mitral regurgitation due to cusp prolapse . Memory loss . CIDP (chronic inflammatory demyelinating polyneuropathy) ([place]) . Atrial fibrillation ([place])   I have reviewed the medical record, interviewed the patient and family, and examined the patient. The following aspects are pertinent.  Past Medical History:  Diagnosis Date  . Allergic rhinitis   . Atrial fibrillation ([place])   . CIDP  (chronic inflammatory demyelinating polyneuropathy) ([place])   . Colonic polyp   . H/O: CVA (cerebrovascular accident)   . Hyperbilirubinemia   . Hyperlipidemia   . Long term (current) use of anticoagulants   . Mitral regurgitation   . Nephrolithiasis   . Nevus, atypical   . Osteopenia   . Recurrent boils   . Unsteady gait    Social History   Socioeconomic History  . Marital status: Married    Spouse name: None  . Number of children: 2  . Years of education: None  . Highest education level: None  Social Needs  . Financial resource strain: None  . Food insecurity - worry: None  . Food insecurity - inability: None  . Transportation needs - medical: None  . Transportation needs - non-medical: None  Occupational History  . Occupation: Engineer, site - retired  Tobacco Use  . Smoking status: Former Smoker    Last attempt to quit: 10/06/1973    Years since quitting: 43.6  . Smokeless tobacco: Never Used  Substance and Sexual Activity  . Alcohol use: No    Comment: usually have a couple of drinks a day  . Drug use: No  . Sexual activity: None  Other Topics Concern  . None  Social History Narrative   Patient is Married(Rodney Malone). Retired. Lives in  single level home, Independent Living  section at [facility] since 2009.   Former smoker, minimal Alcohol history   Patient has Advanced planning documents: Living Will            Family History  Problem Relation Age of Onset  . Colon cancer Neg Hx    Scheduled Meds: . aspirin EC  81 mg Oral Daily  . enoxaparin (LOVENOX) injection  40 mg Subcutaneous Q24H  . guaiFENesin  600 mg Oral BID  . ipratropium-albuterol  3 mL Nebulization TID  . metoprolol succinate  100 mg Oral Daily  . sodium chloride HYPERTONIC  4 mL Nebulization Daily  . valACYclovir  1,000 mg Oral TID   Continuous Infusions: . ceFEPime (MAXIPIME) IV Stopped (05/06/17 0558)  . metronidazole Stopped (05/06/17 1200)   PRN Meds:.acetaminophen  **OR** acetaminophen, food thickener, ipratropium-albuterol, ondansetron **OR** ondansetron (ZOFRAN) IV, white petrolatum Allergies  Allergen Reactions  . Penicillins Other (See Comments)    Has patient had a PCN reaction causing immediate rash, facial/tongue/throat swelling, SOB or lightheadedness with hypotension: no Has patient had a PCN reaction causing severe rash involving mucus membranes or skin necrosis: no Has patient had a PCN reaction that required hospitalization no Has patient had a PCN reaction occurring within the last 10 years: no If all of the above answers are "NO", then may proceed with Cephalosporin use.    Review of Systems  Unable to perform ROS: Mental status change    Physical Exam  Constitutional: He appears well-developed. He  appears cachectic. He appears ill.  Thin, frail, elderly  Cardiovascular: An irregularly irregular rhythm present. Tachycardia present.  Pulmonary/Chest: Effort normal. No accessory muscle usage. No tachypnea. No respiratory distress.  Abdominal: Soft. Normal appearance.  Neurological: He is disoriented.  Somnolent   Nursing note and vitals reviewed.   Vital Signs: BP 115/78 (BP Location: Right Arm)   Pulse (!) 102   Temp (!) 97 F (36.1 C) (Axillary)   Resp 20   Ht 5\' 11"  (1.803 m)   Wt 69.5 kg (153 lb 3.5 oz)   SpO2 99%   BMI 21.37 kg/m   Pain Assessment: No/denies pain   Pain Score: Asleep   SpO2: SpO2: 99 % O2 Device:SpO2: 99 % O2 Flow Rate: .O2 Flow Rate (L/min): 4 L/min  IO: Intake/output summary:   Intake/Output Summary (Last 24 hours) at 05/06/2017 1420 Last data filed at 05/06/2017 0348 Gross per 24 hour  Intake 640 ml  Output 450 ml  Net 190 ml    LBM: Last BM Date: 05/05/17 Baseline Weight: Weight: 63.3 kg (139 lb 8.8 oz) Most recent weight: Weight: 69.5 kg (153 lb 3.5 oz)     Palliative Assessment/Data: 20%     Time Total: 90 min  Greater than 50%  of this time was spent counseling and  coordinating care related to the above assessment and plan.  Signed by: Vinie Sill, NP Palliative Medicine Team Pager # [phone] (M-F 8a-5p) Team Phone # [phone] (Nights/Weekends)

## 2017-05-06 NOTE — Progress Notes (Signed)
Physical Therapy Treatment Patient Details Name: Rodney Malone MRN: [id number] DOB: 12-Dec-1922 Today's Date: 05/06/2017    History of Present Illness Rodney Malone is a 84 y.o. male with history of atrial fibrillation not on anticoagulation secondary to risk of bleed, severe mitral regurgitation, history of CIDP was brought to the ER after patient was found to be weak and dizzy.     PT Comments    Patient received in bed today, pleasant but confused and oriented to self; RN aware and reports that patient has been much more confused with her today than his normal, still OK to work with PT. He requires MaxAx2 for all functional mobility today, including stand-pivot into recliner chair, also MaxAx2 for repositioning in the recliner. He was left up in the chair with all needs met, RN aware of patient status this afternoon.     Follow Up Recommendations  SNF;Supervision/Assistance - 24 hour     Equipment Recommendations  Other (comment);None recommended by PT(defer to next venue )    Recommendations for Other Services       Precautions / Restrictions Precautions Precautions: Fall Restrictions Weight Bearing Restrictions: No    Mobility  Bed Mobility Overal bed mobility: Needs Assistance Bed Mobility: Supine to Sit     Supine to sit: Max assist;+2 for physical assistance     General bed mobility comments: MaxA +2 for functional bed mobility and stabilizing at the edge of the bed   Transfers Overall transfer level: Needs assistance Equipment used: 2 person hand held assist Transfers: Sit to/from Stand;Stand Pivot Transfers Sit to Stand: +2 physical assistance;Max assist Stand pivot transfers: Max assist;+2 physical assistance       General transfer comment: MaxA +2 for safe functional sit to stand and stand pivot today, also MaxAx2 for repositioning in the chair   Ambulation/Gait             General Gait Details: unable to do so safely    Stairs             Wheelchair Mobility    Modified Rankin (Stroke Patients Only)       Balance Overall balance assessment: Needs assistance Sitting-balance support: No upper extremity supported;Single extremity supported Sitting balance-Leahy Scale: Fair Sitting balance - Comments: requires close Min guard to MinA to maintain upright sitting position today    Standing balance support: During functional activity Standing balance-Leahy Scale: Poor Standing balance comment: reliant on external support                             Cognition Arousal/Alertness: Awake/alert Behavior During Therapy: WFL for tasks assessed/performed Overall Cognitive Status: Within Functional Limits for tasks assessed                                        Exercises      General Comments        Pertinent Vitals/Pain Pain Assessment: Faces Pain Score: 0-No pain Faces Pain Scale: No hurt Pain Intervention(s): Limited activity within patient's tolerance;Monitored during session    Home Living                      Prior Function            PT Goals (current goals can now be found in the care plan section) Acute Rehab PT Goals Patient  Stated Goal: get back home PT Goal Formulation: With patient Time For Goal Achievement: 05/17/17 Potential to Achieve Goals: Good Progress towards PT goals: Progressing toward goals    Frequency    Min 2X/week      PT Plan Current plan remains appropriate    Co-evaluation              AM-PAC PT "6 Clicks" Daily Activity  Outcome Measure  Difficulty turning over in bed (including adjusting bedclothes, sheets and blankets)?: Unable Difficulty moving from lying on back to sitting on the side of the bed? : Unable Difficulty sitting down on and standing up from a chair with arms (e.g., wheelchair, bedside commode, etc,.)?: Unable Help needed moving to and from a bed to chair (including a wheelchair)?: A Lot Help needed  walking in hospital room?: Total Help needed climbing 3-5 steps with a railing? : Total 6 Click Score: 7    End of Session Equipment Utilized During Treatment: Gait belt Activity Tolerance: Patient limited by fatigue Patient left: in chair;with call bell/phone within reach Nurse Communication: Mobility status PT Visit Diagnosis: Muscle weakness (generalized) (M62.81);Other abnormalities of gait and mobility (R26.89)     Time: 3709-6438 PT Time Calculation (min) (ACUTE ONLY): 26 min  Charges:  $Therapeutic Activity: 23-37 mins                    G Codes:          Deniece Ree PT, DPT, CBIS  Supplemental Physical Therapist [place]   Pager [phone]

## 2017-05-06 NOTE — NC FL2 (Signed)
[place] LEVEL OF CARE SCREENING TOOL     IDENTIFICATION  Patient Name: Rodney Malone Birthdate: 1923-02-24 Sex: male Admission Date (Current Location): 04/30/2017  [facility] and [place] Number:  Herbalist and Address:  [facility] [place], [place] [address], [place], [place] [address]      Provider Number: [id number]  Attending Physician Name and Address:  Kayleen Memos, DO  Relative Name and Phone Number:  [name], daughter, [phone]    Current Level of Care: Hospital Recommended Level of Care: [place] Prior Approval Number:    Date Approved/Denied:   PASRR Number: [id number]  Discharge Plan: SNF    Current Diagnoses: Patient Active Problem List   Diagnosis Date Noted  . Sepsis ([place]) 05/01/2017  . Pressure injury of skin 05/01/2017  . Influenza with pneumonia   . Memory loss 04/05/2015  . Mild closed head injury 07/15/2013  . CIDP (chronic inflammatory demyelinating polyneuropathy) ([place])   . Unsteady gait   . Long term current use of anticoagulant therapy   . Mitral regurgitation due to cusp prolapse   . Atrial fibrillation ([place])   . Hyperlipidemia     Orientation RESPIRATION BLADDER Height & Weight     Self  O2(nasal cannula 4L) Incontinent, External catheter Weight: 153 lb 3.5 oz (69.5 kg) Height:  5\' 11"  (180.3 cm)  BEHAVIORAL SYMPTOMS/MOOD NEUROLOGICAL BOWEL NUTRITION STATUS      Continent Diet(please see DC summary)  AMBULATORY STATUS COMMUNICATION OF NEEDS Skin   Extensive Assist Verbally PU Stage and Appropriate Care(PU stage I coccyx, foam dressing)                       Personal Care Assistance Level of Assistance  Bathing, Feeding, Dressing Bathing Assistance: Maximum assistance Feeding assistance: Limited assistance Dressing Assistance: Maximum assistance     Functional Limitations Info  Sight, Hearing, Speech Sight Info: Impaired Hearing Info: Adequate Speech Info:  Adequate    SPECIAL CARE FACTORS FREQUENCY  PT (By licensed PT)     PT Frequency: 5x/week              Contractures Contractures Info: Not present    Additional Factors Info  Code Status, Allergies, Isolation Precautions Code Status Info: Full Allergies Info: Penicillins     Isolation Precautions Info: airborne precautions, contact precautions     Current Medications (05/06/2017):  This is the current hospital active medication list Current Facility-Administered Medications  Medication Dose Route Frequency Provider Last Rate Last Dose  . acetaminophen (TYLENOL) tablet 650 mg  650 mg Oral Q6H PRN Rise Patience, MD       Or  . acetaminophen (TYLENOL) suppository 650 mg  650 mg Rectal Q6H PRN Rise Patience, MD      . aspirin EC tablet 81 mg  81 mg Oral Daily Rise Patience, MD   81 mg at 05/06/17 0093  . ceFEPIme (MAXIPIME) 1 g in sodium chloride 0.9 % 100 mL IVPB  1 g Intravenous Q12H Kayleen Memos, DO   Stopped at 05/06/17 8182  . enoxaparin (LOVENOX) injection 40 mg  40 mg Subcutaneous Q24H Rise Patience, MD   40 mg at 05/06/17 9937  . food thickener (THICK IT) powder   Oral PRN Kayleen Memos, DO      . guaiFENesin (MUCINEX) 12 hr tablet 600 mg  600 mg Oral BID Irene Pap N, DO   600 mg at 05/06/17  [place].Gleason  . ipratropium-albuterol (DUONEB) 0.5-2.5 (3) MG/3ML nebulizer solution 3 mL  3 mL Nebulization Q6H PRN Hall, Carole N, DO      . ipratropium-albuterol (DUONEB) 0.5-2.5 (3) MG/3ML nebulizer solution 3 mL  3 mL Nebulization TID Irene Pap N, DO   3 mL at 05/05/17 2113  . metoprolol succinate (TOPROL-XL) 24 hr tablet 100 mg  100 mg Oral Daily Rise Patience, MD   100 mg at 05/06/17 6948  . metroNIDAZOLE (FLAGYL) IVPB 500 mg  500 mg Intravenous [address] DO   Stopped at 05/06/17 0327  . ondansetron (ZOFRAN) tablet 4 mg  4 mg Oral Q6H PRN Rise Patience, MD       Or  . ondansetron [facility]) injection 4 mg  4 mg Intravenous Q6H  PRN Rise Patience, MD      . sodium chloride HYPERTONIC 3 % nebulizer solution 4 mL  4 mL Nebulization Daily Hall, Carole N, DO      . valACYclovir (VALTREX) tablet 1,000 mg  1,000 mg Oral TID Rise Patience, MD   1,000 mg at 05/06/17 5462  . white petrolatum (VASELINE) gel   Topical PRN Kayleen Memos, DO         Discharge Medications: Please see discharge summary for a list of discharge medications.  Relevant Imaging Results:  Relevant Lab Results:   Additional Information SSN: [id number]  [name], LCSW

## 2017-05-06 NOTE — Progress Notes (Signed)
Palliative Medicine consult noted. Due to high referral volume, there may be a delay seeing this patient. Please call the Palliative Medicine Team office at [phone] if recommendations are needed in the interim.  Thank you for inviting [place] to see this patient.  Marjie Skiff Issak Goley, RN, BSN, [facility] Palliative Medicine Team 05/06/2017 8:53 AM Office [phone]

## 2017-05-07 DIAGNOSIS — Z7189 Other specified counseling: Secondary | ICD-10-CM

## 2017-05-07 DIAGNOSIS — I482 Chronic atrial fibrillation: Secondary | ICD-10-CM

## 2017-05-07 DIAGNOSIS — Z515 Encounter for palliative care: Secondary | ICD-10-CM

## 2017-05-07 DIAGNOSIS — J690 Pneumonitis due to inhalation of food and vomit: Secondary | ICD-10-CM

## 2017-05-07 DIAGNOSIS — R1310 Dysphagia, unspecified: Secondary | ICD-10-CM

## 2017-05-07 LAB — BASIC METABOLIC PANEL
ANION GAP: 9 (ref 5–15)
BUN: 13 mg/dL (ref 6–20)
CALCIUM: 9.4 mg/dL (ref 8.9–10.3)
CO2: 22 mmol/L (ref 22–32)
Chloride: 107 mmol/L (ref 101–111)
Creatinine, Ser: 0.71 mg/dL (ref 0.61–1.24)
GFR calc Af Amer: >60 mL/min (ref >60)
GLUCOSE: 105 mg/dL — AB (ref 65–99)
Potassium: 4.3 mmol/L (ref 3.5–5.1)
SODIUM: 138 mmol/L (ref 135–145)

## 2017-05-07 LAB — CBC
HCT: 41.8 % (ref 39.0–52.0)
Hemoglobin: 13.5 g/dL (ref 13.0–17.0)
MCH: 30.7 pg (ref 26.0–34.0)
MCHC: 32.3 g/dL (ref 30.0–36.0)
MCV: 95 fL (ref 78.0–100.0)
PLATELETS: 349 10*3/uL (ref 150–400)
RBC: 4.4 MIL/uL (ref 4.22–5.81)
RDW: 16.1 % — AB (ref 11.5–15.5)
WBC: 14.5 10*3/uL — ABNORMAL HIGH (ref 4.0–10.5)

## 2017-05-07 LAB — PH, BODY FLUID: pH, Body Fluid: 7.5

## 2017-05-07 MED ORDER — TRAZODONE HCL 50 MG PO TABS
25.0000 mg | ORAL_TABLET | Freq: Every day | ORAL | Status: DC
  Administered 2017-05-07: 25 mg via ORAL
  Filled 2017-05-07: qty 1

## 2017-05-07 MED ORDER — HALOPERIDOL LACTATE 5 MG/ML IJ SOLN
2.0000 mg | Freq: Once | INTRAMUSCULAR | Status: AC
Start: 2017-05-07 — End: 2017-05-07
  Administered 2017-05-07: 2 mg via INTRAVENOUS
  Filled 2017-05-07: qty 1

## 2017-05-07 MED ORDER — MAGNESIUM SULFATE 2 GM/50ML IV SOLN
2.0000 g | Freq: Once | INTRAVENOUS | Status: AC
  Administered 2017-05-07: 2 g via INTRAVENOUS
  Filled 2017-05-07: qty 50

## 2017-05-07 NOTE — Progress Notes (Signed)
Palliative:  Rodney Malone is more alert this afternoon but fell asleep quickly during my visit. He has not been alert enough to eat anything yet today.   I spoke more with son, [name], at bedside. Discussed agitation at night and will continue trials of meds to assist with more peaceful nights (trial of trazodone 25 mg qhs SCHEDULED tonight). [name] expresses his desire for MBS as discussed with provider this morning. He is hoping to have some objective answers that this is an irreversible condition. We discussed that MBS is only a small snapshot of swallowing and could also provide false reassurance at times as well depending on "how good" his father is doing at time of test. [name] understands but would like to pursue. We further discussed code status and [name] is hesitant to agree to DNR and wishes to wait until after MBS results.   I spoke with Dr. Nevada Crane and SLP about this plan. I forgot that he had airborne precautions that complicate ability to complete MBS and that he would require FEES.   Late entry: I spoke further with [name] and explained my conversation with SLP and FEES. He understands that this is more invasive. After discussion he feels that he would not wish to pursue FEES. He is interested in speaking with SLP tomorrow though (education about evidence obtained with bedside eval would be helpful). Further discussed the limited benefit of objective testing as the evidence points that this is a chronic issue that will lead to his further decline. Encouraged him to discuss with his daughter-in-law who is an intensivist at [employer]. He says that he has discussed and that she agrees this is likely an issue that has been occurring and will continue. Will speak further with family tomorrow.   35 min  Vinie Sill, NP Palliative Medicine Team Pager # [phone] (M-F 8a-5p) Team Phone # [phone] (Nights/Weekends)

## 2017-05-07 NOTE — Progress Notes (Signed)
  Speech Language Pathology Treatment: Dysphagia  Patient Details Name: Rodney Malone MRN: [id number] DOB: 1923/01/30 Today's Date: 05/07/2017 Time: 1010-1020 SLP Time Calculation (min) (ACUTE ONLY): 10 min  Assessment / Plan / Recommendation Clinical Impression  Followed up with pt and son after prior SLP assessment initiating nectar thick liquids and soft solids. Son reports pt has tolerated nectar much better than thin liquids and has not complained about the texture change. Discussed plan of care and confirmed sons feelings that any further testing would not benefit pt if goal is for comfort. Pt tolerated one sip of nectar thick liquids well, but did not want any further sips. Advised son to continue diet into next level of care to reduce pts coughing and struggle with liquids as long as he is happy to drink thickened texture. No SLP f/u needed, will sign off.   HPI HPI: Rodney Malone a 84 y.o.malewithhistory of atrial fibrillation not on anticoagulation secondary to risk of bleed, severe mitral regurgitation, history of CIDP was brought to the ER after patient was found to be weak and dizzy. As per the patient's daughter patient usually uses a walker but today found very weak and had to be taken to the bathroom with help. After going to the bathroom patient found it difficult to get up and EMS was called and was brought to the ER. EMS also noticed that patient was mildly febrile.  Patient also was diagnosed with shingles 2 days ago and was started on Valtrex.  MRI of the brain is negative for acute findings.  Chest xray is showing small right pleural effusion with RML and basilar atelectasis or PNA.        SLP Plan  All goals met       Recommendations  Diet recommendations: Dysphagia 3 (mechanical soft);Nectar-thick liquid Liquids provided via: Cup Medication Administration: Whole meds with puree Supervision: Staff to assist with self feeding;Patient able to self  feed Compensations: Slow rate;Small sips/bites Postural Changes and/or Swallow Maneuvers: Seated upright 90 degrees                Plan: All goals met       GO                Rodney Malone, Katherene Ponto 05/07/2017, 10:21 AM

## 2017-05-07 NOTE — Plan of Care (Signed)
Pt consumes less than 25% of meals.

## 2017-05-07 NOTE — Progress Notes (Signed)
Pt's HR remaining a little more elevated than previous nights but all other VSS, Charge RN in to assess him also and give suggestions, will change D/T incontinence and start his IV antibiotics, mouthcare done and pt given his Metoprolol a little early, will leave his IVF's running at 10cc after IV antibiotics for a little bit and continue to monitor.

## 2017-05-07 NOTE — Progress Notes (Signed)
Juliann Pulse Schorr called back, was given an update and will give some Magnesium Sulfate 2GMS IV and some more Haldol for agitation and he continues to be pulling at things and trying to get OOB, he seems more agiotated with the mitts on but I don't want him to pull off the oxygen because his sats drop into the low to mid 80's, will continue to monitor.

## 2017-05-07 NOTE — Progress Notes (Addendum)
PROGRESS NOTE  Rodney Malone QKM:[id number] DOB: [birth year] DOA: 04/30/2017 PCP: [name], MD  HPI/Recap of past 24 hours: Rodney Malone is a 84 y.o. male with history of atrial fibrillation not on anticoagulation secondary to risk of bleed, severe mitral regurgitation, history of CIDP was brought to the ER after patient was found to be weak and dizzy.  As per the patient's daughter patient usually uses a walker but today found very weak and had to be taken to the bathroom with help.  After going to the bathroom patient found it difficult to get up and EMS was called and was brought to the ER.  EMS also noticed that patient was mildly febrile.  Patient also was diagnosed with shingles 2 days ago and was started on Valtrex. 05/03/17: Speech recommends dysphagia 3 with nectar thick liquid provided via cup. cxr revealed worsening right lobe infiltrates as reviewed by self with concern for aspiration. IV flagyl added for anaerobes coverage.  05/04/17: patient seen and examined. No new complaints. Spoke with the patient's son who will attempt to find the patient's advanced directives. Patient is alert but only oriented to self. Son consented on obtaining right thoracentesis.  05/06/17: POD#1 post right thoracentesis by IR with 1.8L fluid removed. Patient states feels better. O2 sat 100% on 3L. No new complaints. Spoke with the patient's daughter on the phone and updated on current medical condition. Palliative care has been consulted to aid family in making decision regarding goals of care.  2/26. No new complaints. Palliative care also following. Highly appreciated.   Assessment/Plan: Principal Problem:   Sepsis ([place]) Active Problems:   Mitral regurgitation due to cusp prolapse   Atrial fibrillation (HCC)   CIDP (chronic inflammatory demyelinating polyneuropathy) (H[facility])   Memory loss   Pressure injury of skin   Aspiration pneumonia of right lung (HCC)   Dysphagia   Goals of care,  counseling/discussion   Palliative care encounter  HCAP, poa with suspected aspiration -05/07/17 continue IV cefepime and IV flagyl -swallow ruled in dysphagia -continue duonebs  -continue O2 supplement to maintain O2 sat 92% or greater -continuous O2 monitoring -05/07/17 continue pulmonary toilet  Left groin Shingles, poa -05/07/17 continue antiviral -not all lesions are completely crusty -continue to monitor -continue precautions  Acute hypoxic respiratory failure -management as stated above  Right moderate pleural effusion, post right thoracentesis (05/05/17) -IR for right thoracentesis -1.8L fluid removed-sent for cultures -05/07/17 continue O2 supplement  Dysphagia 3 -speech therapist recommends dysphagia 3 diet and nectar thick liquid -aspiration precaution  Generalized weakness/physical debility -r/o acute CVA -MRI brain negative -neurology signed off -05/07/17 continue to increase po calorie intake as tolerated -OOB to chair with every shift -PT assess and treat -Patient used a walker   Chronic a-fib -rate contrrolled -05/07/17 continue home meds -no anticoagulation due to hx of GI bleed on aspirirn  Recently diagnosed shingles -05/07/17 continue valtrex -airborne/contact precaution  Hx of GI bleed -monitor H&H -no overt sign of bleeding  Hx of severe MR -continue monitoring  Moderate protein calorie malnutrition -albumin 3.4 -nutritionist consult -05/07/17 continue oral supplement  Mild hyponatremia, resolved -most likely 2/2 to acute lung process -na+ 136 from 134 -repeat BMP am  Eleveated AST/total bilirubin -05/07/17 continue to monitor for symptoms or signs of liver disease -repeat levels -ast 58 from 50 -alk phos 134 from 150  CIDP -noted    Code Status: full  Family Communication: none at bedside  Disposition Plan: to be determined   Consultants:  Neurology  Palliative care team  Procedures:  Right thoracentesis  05/05/17  Antimicrobials:  IV cefepime, IV flagyl  On valtrex for shingles   DVT prophylaxis:  sq lovenox   Objective: Vitals:   05/06/17 2043 05/07/17 0418 05/07/17 0547 05/07/17 0923  BP:   123/74   Pulse:   (!) 106   Resp:      Temp:  97.7 F (36.5 C)    TempSrc:  Oral    SpO2: 97%   100%  Weight:  64.3 kg (141 lb 12.1 oz)    Height:        Intake/Output Summary (Last 24 hours) at 05/07/2017 1012 Last data filed at 05/07/2017 1000 Gross per 24 hour  Intake 570 ml  Output 100 ml  Net 470 ml   Filed Weights   05/04/17 0549 05/05/17 0604 05/07/17 0418  Weight: 69 kg (152 lb 1.9 oz) 69.5 kg (153 lb 3.5 oz) 64.3 kg (141 lb 12.1 oz)    Exam: 05/07/17 patient seen and examined. Physical exam is unchanged from prior   General:  84 yo CM frail NAD alert and oriented to self  Cardiovascular: IRR no rubs or gallops   Respiratory: diffused rales. No wheezes  Abdomen: soft NT ND NBS x4   Musculoskeletal: b/l LE 2+ edema; moves lower extremities but equal weakness noted   Skin: left groin shingles radiating to the back, lesions that do not cross the midline-multistage-not all dry.  Psychiatry: mood is appropriate for condition and setting   Data Reviewed: CBC: Recent Labs  Lab 04/30/17 2247 04/30/17 2257 05/01/17 0052 05/01/17 0258 05/03/17 0943 05/05/17 0852  WBC 14.9*  --  13.3* 13.0* 14.0* 12.3*  NEUTROABS 11.1*  --   --  9.3*  --   --   HGB 15.0 15.6 13.2 13.1 13.1 13.5  HCT 43.8 46.0 39.5 39.9 39.9 41.4  MCV 94.6  --  93.8 94.1 94.5 94.5  PLT 308  --  290 288 263 327   Basic Metabolic Panel: Recent Labs  Lab 04/30/17 2247 04/30/17 2257 05/01/17 0052 05/01/17 0258 05/03/17 0943 05/05/17 0852  NA 138 139  --  134* 132* 136  K 4.8 4.7  --  4.3 4.2 4.0  CL 105 103  --  105 105 105  CO2 22  --   --  20* 18* 23  GLUCOSE 144* 133*  --  135* 152* 119*  BUN 19 23*  --  '19 19 16  ' CREATININE 0.90 0.80 0.81 0.81 0.77 0.75  CALCIUM 9.8  --   --  8.9  9.0 9.2  MG  --   --  1.8  --   --   --    GFR: Estimated Creatinine Clearance: 53.6 mL/min (by C-G formula based on SCr of 0.75 mg/dL). Liver Function Tests: Recent Labs  Lab 04/30/17 2247 05/01/17 0258  AST 50* 58*  ALT 20 23  ALKPHOS 154* 134*  BILITOT 2.3* 2.0*  PROT 7.5 6.2*  ALBUMIN 4.1 3.4*   No results for input(s): LIPASE, AMYLASE in the last 168 hours. No results for input(s): AMMONIA in the last 168 hours. Coagulation Profile: Recent Labs  Lab 04/30/17 2247  INR 1.57   Cardiac Enzymes: Recent Labs  Lab 05/01/17 0052  CKTOTAL 186  TROPONINI 0.03*   BNP (last 3 results) No results for input(s): PROBNP in the last 8760 hours. HbA1C: No results for input(s): HGBA1C in the last 72 hours. CBG: Recent Labs  Lab 05/03/17 1628  05/03/17 2119 05/05/17 0727 05/05/17 1157 05/05/17 1651  GLUCAP 122* 95 98 122* 104*   Lipid Profile: No results for input(s): CHOL, HDL, LDLCALC, TRIG, CHOLHDL, LDLDIRECT in the last 72 hours. Thyroid Function Tests: No results for input(s): TSH, T4TOTAL, FREET4, T3FREE, THYROIDAB in the last 72 hours. Anemia Panel: No results for input(s): VITAMINB12, FOLATE, FERRITIN, TIBC, IRON, RETICCTPCT in the last 72 hours. Urine analysis:    Component Value Date/Time   COLORURINE YELLOW 05/01/2017 0000   APPEARANCEUR CLEAR 05/01/2017 0000   LABSPEC 1.024 05/01/2017 0000   PHURINE 5.0 05/01/2017 0000   GLUCOSEU NEGATIVE 05/01/2017 0000   HGBUR NEGATIVE 05/01/2017 0000   BILIRUBINUR NEGATIVE 05/01/2017 0000   KETONESUR NEGATIVE 05/01/2017 0000   PROTEINUR 30 (A) 05/01/2017 0000   UROBILINOGEN 0.2 08/09/2008 1949   NITRITE NEGATIVE 05/01/2017 0000   LEUKOCYTESUR NEGATIVE 05/01/2017 0000   Sepsis Labs: '@LABRCNTIP' (procalcitonin:4,lacticidven:4)  ) Recent Results (from the past 240 hour(s))  Blood Culture (routine x 2)     Status: None   Collection Time: 04/30/17 11:40 PM  Result Value Ref Range Status   Specimen Description BLOOD  LEFT WRIST  Final   Special Requests   Final    BOTTLES DRAWN AEROBIC AND ANAEROBIC Blood Culture adequate volume   Culture   Final    NO GROWTH 5 DAYS Performed at [facility], [address], [place], [place] [address]    Report Status 05/06/2017 FINAL  Final  Blood Culture (routine x 2)     Status: None   Collection Time: 04/30/17 11:50 PM  Result Value Ref Range Status   Specimen Description BLOOD RIGHT HAND  Final   Special Requests AEROBIC BOTTLE ONLY Blood Culture adequate volume  Final   Culture   Final    NO GROWTH 5 DAYS Performed at [facility], [place] [address], [place], [place] [address]    Report Status 05/06/2017 FINAL  Final  MRSA PCR Screening     Status: None   Collection Time: 05/03/17  6:12 PM  Result Value Ref Range Status   MRSA by PCR NEGATIVE NEGATIVE Final    Comment:        The GeneXpert MRSA Assay (FDA approved for NASAL specimens only), is one component of a comprehensive MRSA colonization surveillance program. It is not intended to diagnose MRSA infection nor to guide or monitor treatment for MRSA infections. Performed at [facility], [place] [address] [place], [place] [address]   Culture, body fluid-bottle     Status: None (Preliminary result)   Collection Time: 05/05/17  1:00 PM  Result Value Ref Range Status   Specimen Description PLEURAL RIGHT  Final   Special Requests NONE  Final   Culture   Final    NO GROWTH < 24 HOURS Performed at [facility], [place] [address] [place], [place] [address]    Report Status PENDING  Incomplete  Gram stain     Status: None   Collection Time: 05/05/17  1:00 PM  Result Value Ref Range Status   Specimen Description PLEURAL RIGHT  Final   Special Requests NONE  Final   Gram Stain   Final    MODERATE WBC PRESENT,BOTH PMN AND MONONUCLEAR NO ORGANISMS SEEN Performed at [facility], [address] [place], [place] [address]    Report Status 05/05/2017 FINAL  Final       Studies: No results found.  Scheduled Meds: . aspirin EC  81 mg Oral Daily  . enoxaparin (LOVENOX) injection  40 mg Subcutaneous Q24H  . guaiFENesin  600 mg Oral BID  . ipratropium-albuterol  3 mL Nebulization TID  . metoprolol succinate  100 mg Oral Daily  . valACYclovir  1,000 mg Oral TID    Continuous Infusions: . ceFEPime (MAXIPIME) IV 1 g (05/07/17 0547)  . metronidazole 500 mg (05/07/17 0937)     LOS: 6 days     Kayleen Memos, MD [facility] Pager [phone]  If 7PM-7AM, please contact night-coverage [email or web address] Password [email or web address] 05/07/2017, 10:12 AM

## 2017-05-07 NOTE — Progress Notes (Signed)
Pt had an 8 beat run of V-Tach in A-Fib but rate has been 120-130's nonsustained. Pt had Haldol 2 mg IV earlier for increased confusion with some irritation, text paged MD, VSS other than the HR, will continue to monitor.

## 2017-05-08 DIAGNOSIS — Z66 Do not resuscitate: Secondary | ICD-10-CM

## 2017-05-08 LAB — CREATININE, SERUM
CREATININE: 0.84 mg/dL (ref 0.61–1.24)
GFR calc Af Amer: >60 mL/min (ref >60)

## 2017-05-08 MED ORDER — STARCH (THICKENING) PO POWD: ORAL | 0 refills | Status: AC

## 2017-05-08 MED ORDER — GUAIFENESIN ER 600 MG PO TB12: 600.0000 mg | ORAL_TABLET | Freq: Two times a day (BID) | ORAL | Status: AC

## 2017-05-08 MED ORDER — TRAZODONE HCL 50 MG PO TABS: 25.0000 mg | ORAL_TABLET | Freq: Every day | ORAL | Status: AC

## 2017-05-08 MED ORDER — ONDANSETRON HCL 4 MG PO TABS: 4.0000 mg | ORAL_TABLET | Freq: Four times a day (QID) | ORAL | 0 refills | Status: AC | PRN

## 2017-05-08 MED ORDER — CEFPODOXIME PROXETIL 200 MG PO TABS
200.0000 mg | ORAL_TABLET | Freq: Two times a day (BID) | ORAL | Status: DC
  Administered 2017-05-08: 200 mg via ORAL
  Filled 2017-05-08: qty 1

## 2017-05-08 MED ORDER — CEFPODOXIME PROXETIL 200 MG PO TABS: 200.0000 mg | ORAL_TABLET | Freq: Two times a day (BID) | ORAL | Status: AC

## 2017-05-08 MED ORDER — IPRATROPIUM-ALBUTEROL 0.5-2.5 (3) MG/3ML IN SOLN: 3.0000 mL | Freq: Four times a day (QID) | RESPIRATORY_TRACT | Status: AC | PRN

## 2017-05-08 NOTE — Clinical Social Work Note (Signed)
CSW facilitated patient discharge including contacting patient family and facility to confirm patient discharge plans. Clinical information faxed to facility and family agreeable with plan. CSW arranged ambulance transport via [facility] to [facility] at 2:30 pm. RN to call report prior to discharge [phone]).  CSW will sign off for now as social work intervention is no longer needed. Please consult [place] again if new needs arise.  Dayton Scrape, [place]

## 2017-05-08 NOTE — Clinical Social Work Placement (Signed)
   CLINICAL SOCIAL WORK PLACEMENT  NOTE  Date:  05/08/2017  Patient Details  Name: Rodney Malone MRN: [id number] Date of Birth: 1922-12-03  Clinical Social Work is seeking post-discharge placement for this patient at the [place] level of care (*CSW will initial, date and re-position this form in  chart as items are completed):  Yes   Patient/family provided with [place] Work Department's list of facilities offering this level of care within the geographic area requested by the patient (or if unable, by the patient's family).  Yes   Patient/family informed of their freedom to choose among providers that offer the needed level of care, that participate in Medicare, Medicaid or managed care program needed by the patient, have an available bed and are willing to accept the patient.  Yes   Patient/family informed of [place] [facility]'s ownership interest in [facility] and [facility], as well as of the fact that they are under no obligation to receive care at these facilities.  PASRR submitted to EDS on 05/06/17     PASRR number received on 05/06/17     Existing PASRR number confirmed on       FL2 transmitted to all facilities in geographic area requested by pt/family on 05/06/17     FL2 transmitted to all facilities within larger geographic area on       Patient informed that his/her managed care company has contracts with or will negotiate with certain facilities, including the following:  [facility]     Yes   Patient/family informed of bed offers received.  Patient chooses bed at [facility]     Physician recommends and patient chooses bed at      Patient to be transferred to [facility] on 05/08/17.  Patient to be transferred to facility by [facility]     Patient family notified on 05/08/17 of transfer.  Name of family member notified:  [name], daughter     PHYSICIAN Please prepare priority discharge summary, including medications,  Please prepare prescriptions     Additional Comment:    _______________________________________________ [name], LCSW 05/08/2017, 11:22 AM

## 2017-05-08 NOTE — Discharge Summary (Signed)
Physician Discharge Summary   Patient ID: Rodney Malone MRN: [id number] DOB/AGE: 1922-07-21 84 y.o.  Admit date: 04/30/2017 Discharge date: 05/08/2017  Primary Care Physician:  [name], MD   Recommendations for Outpatient Follow-up:  1. Follow up with PCP in 1-2 weeks 2. Please obtain BMP/CBC in one week 3. Please have palliative care follow patients at the skilled nursing facility  Home Health: No  Equipment/Devices:  Discharge Condition: Guarded, recommend hospice/palliative care to follow CODE STATUS: Full code Diet recommendation: Dysphagia 3 nectar thick diet   Discharge Diagnoses:    . Sepsis ([place]) Healthcare associated pneumonia with possible aspiration Left groin shingles, completed treatment Acute hypoxic respiratory failure Right moderate pleural effusion status post thoracentesis . Mitral regurgitation due to cusp prolapse . Memory loss . CIDP (chronic inflammatory demyelinating polyneuropathy) ([place]) . Chronic atrial fibrillation ([facility]) Mild hyponatremia resolved Moderate protein calorie malnutrition Elevated AST/bilirubin   Consults: Palliative care Interventional radiology for thoracentesis   Allergies:   Allergies  Allergen Reactions  . Penicillins Other (See Comments)    Has patient had a PCN reaction causing immediate rash, facial/tongue/throat swelling, SOB or lightheadedness with hypotension: no Has patient had a PCN reaction causing severe rash involving mucus membranes or skin necrosis: no Has patient had a PCN reaction that required hospitalization no Has patient had a PCN reaction occurring within the last 10 years: no If all of the above answers are "NO", then may proceed with Cephalosporin use.      DISCHARGE MEDICATIONS: Allergies as of 05/08/2017      Reactions   Penicillins Other (See Comments)   Has patient had a PCN reaction causing immediate rash, facial/tongue/throat swelling, SOB or lightheadedness with hypotension:  no Has patient had a PCN reaction causing severe rash involving mucus membranes or skin necrosis: no Has patient had a PCN reaction that required hospitalization no Has patient had a PCN reaction occurring within the last 10 years: no If all of the above answers are "NO", then may proceed with Cephalosporin use.      Medication List    STOP taking these medications   furosemide 20 MG tablet Commonly known as:  LASIX   valACYclovir 1000 MG tablet Commonly known as:  VALTREX     TAKE these medications   acetaminophen 325 MG tablet Commonly known as:  TYLENOL Take 650 mg by mouth every 6 (six) hours as needed for mild pain.   aspirin 81 MG chewable tablet Chew 81 mg by mouth every morning.   cefpodoxime 200 MG tablet Commonly known as:  VANTIN Take 1 tablet (200 mg total) by mouth 2 (two) times daily for 3 days.   cetirizine 10 MG tablet Commonly known as:  ZYRTEC Take 10 mg by mouth daily as needed for allergies.   diphenhydramine-acetaminophen 25-500 MG Tabs tablet Commonly known as:  TYLENOL PM Take 1 tablet by mouth at bedtime as needed (sleep).   food thickener Powd Commonly known as:  THICK IT NECTAR THICK   guaiFENesin 600 MG 12 hr tablet Commonly known as:  MUCINEX Take 1 tablet (600 mg total) by mouth 2 (two) times daily.   ipratropium-albuterol 0.5-2.5 (3) MG/3ML Soln Commonly known as:  DUONEB Take 3 mLs by nebulization every 6 (six) hours as needed.   ketoconazole 2 % cream Commonly known as:  NIZORAL Apply 1 application topically as needed for irritation.   lactose free nutrition Liqd Take 237 mLs by mouth 2 (two) times daily between meals.   lidocaine 4 %  cream Commonly known as:  LMX Apply 1 application topically daily as needed (pain).   metoprolol succinate 100 MG 24 hr tablet Commonly known as:  TOPROL-XL Take 100 mg by mouth every morning. Take with or immediately following a meal.   ondansetron 4 MG tablet Commonly known as:   ZOFRAN Take 1 tablet (4 mg total) by mouth every 6 (six) hours as needed for nausea.   polyvinyl alcohol 1.4 % ophthalmic solution Commonly known as:  LIQUIFILM TEARS Place 1 drop into both eyes daily as needed for dry eyes.   PRESERVISION AREDS 2 PO Take 1 capsule by mouth 2 (two) times daily.   traZODone 50 MG tablet Commonly known as:  DESYREL Take 0.5 tablets (25 mg total) by mouth at bedtime.        Brief H and P: For complete details please refer to admission H and P, but in briefFreeman Jonesis a 84 y.o.malewithhistory of atrial fibrillation not on anticoagulation secondary to risk of bleed, severe mitral regurgitation, history of CIDP was brought to the ER after patient was found to be weak and dizzy. As per the patient's daughter patient usually uses a walker but today found very weak and had to be taken to the bathroom with help. After going to the bathroom patient found it difficult to get up and EMS was called and was brought to the ER. EMS also noticed that patient was mildly febrile. Patient also was diagnosed with shingles 2 days prior to admission and was started on Valtrex.   Hospital Course:     Sepsis [facility]) with acute hypoxic respiratory failure likely secondary to HCAP, possible aspiration -Patient met sepsis criteria at the time of admission due to tachypnea, tachycardia, low-grade temp 99.2 F, lactic acidosis, likely due to pneumonia -Patient was placed on IV cefepime and Flagyl.  Completed 8 days, will continue 3 more days of vantin.  Patient needs strict aspiration precautions -Patient underwent SLP evaluation, has aspiration, placed on dysphagia 3 nectar thick diet -Continue O2 supplement to maintain O2 sats 92% or greater, pulmonary toileting, aspiration precautions, duo nebs as needed -Palliative care was consulted, recommended palliative care to follow at the skilled nursing facility.  Active Problems: Left groin shingles -Now crusted, patient was  diagnosed with shingles 2 days prior to admission and was started on Valtrex which he has completed on 05/07/17.  He was placed on airborne precautions during hospitalization.  Right moderate pleural effusion -IR was consulted, status post thoracentesis on 05/05/17 -1.8 L fluid removed, transudative.   Moderate protein calorie malnutrition -Albumin 3.4, continue oral supplements, nutrition was consulted  Mild hyponatremia -Resolved  History of GI bleed -Currently stable  Chronic atrial fibrillation -Rate controlled, no anticoagulation due to history of GI bleed -Continue aspirin  History of MR -Continue monitoring  Mildly elevated transaminitis -Likely due to #1, improving  Pressure injury of the skin -Per flow sheets, groin lesions charted, has shingles, completed Valtrex   Day of Discharge S: Appears pleasant, denies any specific complaints, no fevers overnight.  BP 116/86 (BP Location: Right Arm)   Pulse 90   Temp 97.9 F (36.6 C) (Oral)   Resp (!) 21   Ht 5' 11" (1.803 m)   Wt 65 kg (143 lb 4.8 oz)   SpO2 98%   BMI 19.99 kg/m   Physical Exam: General: Alert and awake oriented to self, not in any acute distress. HEENT: anicteric sclera, pupils reactive to light and accommodation CVS: S1-S2 clear no murmur rubs  or gallops Chest: clear to auscultation bilaterally, no wheezing rales or rhonchi Abdomen: soft nontender, nondistended, normal bowel sounds Extremities: no cyanosis, clubbing, 1+ edema noted bilaterally Skin: Left groin shingles radiating to the back, crusted  The results of significant diagnostics from this hospitalization (including imaging, microbiology, ancillary and laboratory) are listed below for reference.      Procedures/Studies: Dg Chest 2 View  Result Date: 04/30/2017 CLINICAL DATA:  Slurred speech EXAM: CHEST  2 VIEW COMPARISON:  04/08/2016 FINDINGS: Small right-sided pleural effusion with right basilar and middle lobe atelectasis or  pneumonia. Streaky atelectasis or scarring at the left base. Cardiomegaly with vascular congestion. Mild diffuse interstitial prominence suggests a mild component of edema. Aortic atherosclerosis. No pneumothorax. IMPRESSION: 1. Small right-sided pleural effusion with right middle lobe and basilar atelectasis or pneumonia 2. Cardiomegaly with vascular congestion and mild diffuse interstitial prominence suggesting edema Electronically Signed   By: Donavan Foil M.D.   On: 04/30/2017 23:49   Mr Brain Wo Contrast  Result Date: 05/02/2017 CLINICAL DATA:  84 y/o  M; weakness and dizziness. EXAM: MRI HEAD WITHOUT CONTRAST TECHNIQUE: Multiplanar, multiecho pulse sequences of the brain and surrounding structures were obtained without intravenous contrast. COMPARISON:  04/30/2017 CT head FINDINGS: Brain: No acute infarction, hemorrhage, hydrocephalus, extra-axial collection or mass lesion. Right anteromedial frontal lobe encephalomalacia. Patchynonspecific foci of T2 FLAIR hyperintense signal abnormality in predominantly periventricular are compatible withmoderatechronic microvascular ischemic changes for age. Moderatebrain parenchymal volume loss. Vascular: Normal flow voids. Skull and upper cervical spine: Normal marrow signal. Sinuses/Orbits: Small maxillary sinus mucous retention cyst. No abnormal signal of mastoid air cells. Bilateral intra-ocular lens replacement. Other: None. IMPRESSION: 1. No acute intracranial abnormality identified. 2. Moderate chronic microvascular ischemic changes and moderate parenchymal volume loss of the brain. 3. Chronic right anteromedial frontal encephalomalacia. Electronically Signed   By: Kristine Garbe M.D.   On: 05/02/2017 18:00   Dg Chest Port 1 View  Result Date: 05/05/2017 CLINICAL DATA:  Followup right thoracentesis. EXAM: PORTABLE CHEST 1 VIEW COMPARISON:  05/04/2017 FINDINGS: Cardiomegaly. Aortic atherosclerosis. Marked reduction in pleural fluid on the right.  Mild bibasilar atelectasis noted today. No pneumothorax. IMPRESSION: Marked reduction and pleural fluid on the right following pneumothorax. Bibasilar atelectasis. No pneumothorax. Cardiomegaly and aortic atherosclerosis. Electronically Signed   By: Nelson Chimes M.D.   On: 05/05/2017 14:21   Dg Chest Port 1 View  Result Date: 05/04/2017 CLINICAL DATA:  Shortness of breath. EXAM: PORTABLE CHEST 1 VIEW COMPARISON:  Chest x-ray dated April 30, 2017. FINDINGS: Stable cardiomegaly. Mild pulmonary vascular congestion, unchanged. Interval increase in size of now moderate right pleural effusion with hazy opacity in the right mid and lower lungs and partial collapse of the right lower lobe. No pneumothorax. No acute osseous abnormality. IMPRESSION: 1. Increasing size of now moderate right pleural effusion with worsening right middle and lower lobe atelectasis/infiltrate. Electronically Signed   By: Titus Dubin M.D.   On: 05/04/2017 08:34   Ct Head Code Stroke Wo Contrast  Result Date: 04/30/2017 CLINICAL DATA:  Code stroke. RIGHT facial droop, last seen normal at 2000 hours. EXAM: CT HEAD WITHOUT CONTRAST TECHNIQUE: Contiguous axial images were obtained from the base of the skull through the vertex without intravenous contrast. COMPARISON:  CT HEAD February 20, 2015 FINDINGS: BRAIN: No intraparenchymal hemorrhage, mass effect nor midline shift. RIGHT mesial occipital lobe encephalomalacia. Mild ex vacuo dilatation RIGHT frontal horn of lateral ventricle. Moderate to severe parenchymal brain volume loss. No hydrocephalus. Patchy supratentorial white matter  hypodensities unchanged. No abnormal extra-axial fluid collections. VASCULAR: Moderate calcific atherosclerosis of the carotid siphons. SKULL: No skull fracture. Severe temporomandibular osteoarthrosis. No significant scalp soft tissue swelling. SINUSES/ORBITS: Trace paranasal sinus mucosal thickening without air-fluid levels. Mastoid air cells are well  aerated. Soft tissue within LEFT external auditory canal compatible with cerumen. Included ocular globes and orbital contents are non-suspicious. Status post bilateral ocular lens implants. OTHER: None. ASPECTS [facility] Stroke Program Early CT Score) - Ganglionic level infarction (caudate, lentiform nuclei, internal capsule, insula, M1-M3 cortex): 7 - Supraganglionic infarction (M4-M6 cortex): 3 Total score (0-10 with 10 being normal): 10 IMPRESSION: 1. No acute intracranial process. 2. ASPECTS is 10. 3. 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 Critical Value/emergent results text paged to [place], Neurology via [employer] secure system on 04/30/2017 at 11:05 pm, including interpreting physician's phone number. Electronically Signed   By: Elon Alas M.D.   On: 04/30/2017 23:06   US Thoracentesis Asp Pleural Space W/img Guide  Result Date: 05/07/2017 INDICATION: Patient with right pleural effusion. Request is made for diagnostic and therapeutic thoracentesis. EXAM: ULTRASOUND GUIDED DIAGNOSTIC AND THERAPEUTIC RIGHT THORACENTESIS MEDICATIONS: 10 mL 1% lidocaine COMPLICATIONS: None immediate. PROCEDURE: An ultrasound guided thoracentesis was thoroughly discussed with the patient and questions answered. The benefits, risks, alternatives and complications were also discussed. The patient understands and wishes to proceed with the procedure. Written consent was obtained. Ultrasound was performed to localize and mark an adequate pocket of fluid in the right chest. The area was then prepped and draped in the normal sterile fashion. 1% Lidocaine was used for local anesthesia. Under ultrasound guidance a Safe-T-Centesis catheter was introduced. Thoracentesis was performed. The catheter was removed and a dressing applied. FINDINGS: A total of approximately 1.8 L of clear, yellow fluid was removed. Samples were sent to the laboratory as  requested by the clinical team. IMPRESSION: Successful ultrasound guided diagnostic and therapeutic right thoracentesis yielding 1.8 L of pleural fluid. Read by: Brynda Greathouse PA-C Electronically Signed   By: Marybelle Killings M.D.   On: 05/05/2017 15:54    (Echo, Carotid, EGD, Colonoscopy, ERCP)    LAB RESULTS: Basic Metabolic Panel: Recent Labs  Lab 05/05/17 0852 05/07/17 1020 05/08/17 0629  NA 136 138  --   K 4.0 4.3  --   CL 105 107  --   CO2 23 22  --   GLUCOSE 119* 105*  --   BUN 16 13  --   CREATININE 0.75 0.71 0.84  CALCIUM 9.2 9.4  --    Liver Function Tests: No results for input(s): AST, ALT, ALKPHOS, BILITOT, PROT, ALBUMIN in the last 168 hours. No results for input(s): LIPASE, AMYLASE in the last 168 hours. No results for input(s): AMMONIA in the last 168 hours. CBC: Recent Labs  Lab 05/05/17 0852 05/07/17 1020  WBC 12.3* 14.5*  HGB 13.5 13.5  HCT 41.4 41.8  MCV 94.5 95.0  PLT 314 349   Cardiac Enzymes: No results for input(s): CKTOTAL, CKMB, CKMBINDEX, TROPONINI in the last 168 hours. BNP: Invalid input(s): POCBNP CBG: Recent Labs  Lab 05/05/17 1157 05/05/17 1651  GLUCAP 122* 104*      Disposition and Follow-up: Discharge Instructions    Increase activity slowly   Complete by:  As directed        DISPOSITION: Skilled nursing facility   [place] information for follow-up providers    [name], MD. Schedule an appointment as soon as  possible for a visit in 2 week(s).   Specialty:  Internal Medicine Contact information: [address] [place] [address] [phone]            Contact information for after-discharge care    Destination    [facility] Follow up.   Service:  Skilled Nursing Contact information: [address] [place] [phone]                   Time coordinating discharge:  72mns   Signed:   REstill CottaM.D. [facility] 05/08/2017, 11:43 AM Pager: [phone]

## 2017-05-08 NOTE — Care Management Note (Signed)
Case Management Note  Patient Details  Name: Rodney Malone MRN: [id number] Date of Birth: 09-Nov-1922  Subjective/Objective:     Pt presented for weakness, dizziness, HCAP and treating for Shingles. Plan will be for SNF with Hospice to follow.              Action/Plan: CSW assisting with disposition needs. No further needs from CM at this time.   Expected Discharge Date:  05/08/17               Expected Discharge Plan:  Skilled Nursing Facility  In-House Referral:  Clinical Social Work  Discharge planning Services  CM Consult  Post Acute Care Choice:  NA Choice offered to:  NA  DME Arranged:  N/A DME Agency:  NA  HH Arranged:  NA HH Agency:  NA  Status of Service:  Completed, signed off  If discussed at [employer] of Stay Meetings, dates discussed:    Additional Comments:  Bethena Roys, RN 05/08/2017, 11:46 AM

## 2017-05-08 NOTE — Progress Notes (Addendum)
Patient will discharge to [facility]. Anticipated discharge date: 05/08/17  Family notified: Wayburn Shaler, son Transportation by: [facility]  Nurse to call report to [phone].   CSW signing off.  [name], [place]  Clinical Social Worker

## 2017-05-08 NOTE — Progress Notes (Signed)
CSW spoke to Butch Penny at [facility] admissions. [facility] has SNF bed available for patient today and would be prepared to admit him if medically ready. SNF can have hospice and palliative care follow patient in facility.   CSW also spoke to patient's daughter, [name], via phone. Daughter indicated that family is prepared for patient to return to [facility] with hospice following and had questions about when patient will discharge from the hospital.  [place] paged MD. CSW to follow and support with discharge.  [name], [place]

## 2017-05-08 NOTE — Progress Notes (Signed)
Palliative:  Mr. Rodney Malone continues with poor intake and agitation at nights. Returning to [facility] today to get him back to home and closer to his wife. This was priority for family. I believe that they have good understanding of poor prognosis and son was able to see his father struggle with water intake today - this was helpful him to know where they stand and greatly appreciate SLP time and diligence to work with them. Goal is to focus on comfort. I would have a very low threshold for adding hospice care at [facility].   I also readdressed code status and [name] agrees given our discussion and poor prognosis that DNR is appropriate. Ordered changed in electronic record and DNR form signed and placed on chart. All questions and concerns addressed.   15 min  Vinie Sill, NP Palliative Medicine Team Pager # [phone] (M-F 8a-5p) Team Phone # [phone] (Nights/Weekends)

## 2017-05-08 NOTE — Progress Notes (Signed)
SLP Cancellation Note  Patient Details Name: Rodney Malone [place] MRN: [id number] DOB: Apr 27, 1922   Cancelled treatment:       Reason Eval/Treat Not Completed: SLP screened, no needs identified, will sign off. Checked back in with pts son to clarify desire for further SLP interventions. Son discussed plan with MD and then later, with Palliative care NP yesterday and was unsure about need for objective swallow testing at this point. SLP was reordered and I visited pt and son again this am. Pt is now off airborne precautions, so MBS is at least possible. However, I agree that there is no reason to further evaluate pts swallowing at the expense of his comfort at this point which continues to be the son's priority. Subjective observation is quite consistent that ps is aspirating thin liquids, but tolerates nectar relatively well, without complaint. He is obviously grossly weak which is the expected finding on MBS regarding pharyngeal function. Whether pt is acutely or chronically weak will not be determined with any testing, though suspect there is a combination of both issues given family's report. As long as pts family is not planning to participate in swallowing rehabilitation, which they firmly state they are not, MBS would not be beneficial. Son seemed content with plan of care today and is anticipating d/c soon. No formal testing completed today.    Dayvian Blixt, Katherene Ponto 05/08/2017, 10:16 AM

## 2017-05-10 LAB — CULTURE, BODY FLUID-BOTTLE: CULTURE: NO GROWTH

## 2017-05-10 LAB — CULTURE, BODY FLUID W GRAM STAIN -BOTTLE

## 2017-05-13 DIAGNOSIS — R531 Weakness: Secondary | ICD-10-CM | POA: Diagnosis not present

## 2017-05-14 ENCOUNTER — Encounter: Payer: Self-pay | Admitting: Internal Medicine

## 2017-05-14 ENCOUNTER — Non-Acute Institutional Stay (SKILLED_NURSING_FACILITY): Payer: Medicare Other | Admitting: Internal Medicine

## 2017-05-14 DIAGNOSIS — A419 Sepsis, unspecified organism: Secondary | ICD-10-CM | POA: Diagnosis not present

## 2017-05-14 DIAGNOSIS — B372 Candidiasis of skin and nail: Secondary | ICD-10-CM

## 2017-05-14 DIAGNOSIS — I5032 Chronic diastolic (congestive) heart failure: Secondary | ICD-10-CM

## 2017-05-14 DIAGNOSIS — L89301 Pressure ulcer of unspecified buttock, stage 1: Secondary | ICD-10-CM

## 2017-05-14 DIAGNOSIS — J690 Pneumonitis due to inhalation of food and vomit: Secondary | ICD-10-CM | POA: Diagnosis not present

## 2017-05-14 DIAGNOSIS — R1310 Dysphagia, unspecified: Secondary | ICD-10-CM | POA: Diagnosis not present

## 2017-05-14 DIAGNOSIS — G6181 Chronic inflammatory demyelinating polyneuritis: Secondary | ICD-10-CM | POA: Diagnosis not present

## 2017-05-14 DIAGNOSIS — B029 Zoster without complications: Secondary | ICD-10-CM | POA: Diagnosis not present

## 2017-05-14 DIAGNOSIS — Z7189 Other specified counseling: Secondary | ICD-10-CM | POA: Diagnosis not present

## 2017-05-14 NOTE — Progress Notes (Signed)
Patient ID: Rodney Malone, male   DOB: 09-20-1922, 84 y.o.   MRN: [id number]  Provider:  Rexene Edison. Mariea Clonts, D.O., C.M.D. Location:  [facility] Room Number: [place] of Service:  SNF (31)  PCP: [name], MD Patient Care Team: [name], MD as PCP - General (Internal Medicine)  Extended Emergency Contact Information Primary Emergency Contact: Rodney Malone Address: [address] [place], [place] [address] Rodney Malone of [place] Phone: [phone] Mobile Phone: [phone] Relation: Son Secondary Emergency Contact: Rodney Malone,Rodney Malone Address: [address]          [place], [place] [address] Rodney Malone of [place] Phone: [phone] Mobile Phone: [phone] Relation: Daughter  Code Status: DNR, palliative care Goals of Care: Advanced Directive information Advanced Directives 05/14/2017  Does Patient Have a Medical Advance Directive? Yes  Type of Paramedic of [address];Living will;Out of facility DNR (pink MOST or yellow form)  Does patient want to make changes to medical advance directive? No - Patient declined  Copy of [place] in Chart? Yes  Would patient like information on creating a medical advance directive? -  Pre-existing out of facility DNR order (yellow form or pink MOST form) Yellow form placed in chart (order not valid for inpatient use);Pink MOST form placed in chart (order not valid for inpatient use)   Chief Complaint  Patient presents with  . New Admit To SNF    Rehab admission    HPI: Patient is a 84 y.o. male seen today for admission to [facility] s/p hospitalization 2/19-2/27 with weakness.  He has a h/o afib, severe mitral regurg, CIDP and shingles that was just diagnosed before admission.  He was unable to get up when he wanted to use the restroom.  Just 2 days before admission, he was found to have shingles in his left groin and placed on valtrex.   He had a low grade temp, elevated wbc and lactate at admission.  He was admitted with sepsis, HCAP and aspiration pneumonia.  He was begun on empiric abx.  He was treated for hypoxic respiratory failure and found to have a right moderate pleural effusion (underwent thoracentesis 2/24 with 1.8 L of transudate).  He had cusp prolapse noted with his mitral regurgitation.  He also had mild hyponatremia.  He was noted to be moderately malnourished and his AST and bili were elevated.  For his shingles, he completed valtrex 2/26.  He has three more days of vantin for his pneumonia.  He remains on supplemental O2.  He is for f/u cbc, bmp in 1 wk.  He's already seen palliative care here and she has suggested hospice for him.  He was sent here on a dysphagia 3 nectar thick diet, but he and his family have signed a waiver for him to eat a regular diet.    2/26, his WBC was 14.5 MMSE was 15/30 and he was unable to do the clock drawing.  When seen, I met with pt, his wife, Rodney Malone, and his two children, Rodney Malone and Rodney Malone.  Palliative care had already met with him and recommended hospice yesterday.  Pt has been losing weight and eating poorly for several months.  He reports simply not having an appetite.  He remains very weak and cachectic.  He's dependent in adls.  His cognition has been declining and his wife also has memory loss.  They had moved to assisted living  fairly recently from [place].  We had an ACP discussion including this information, as well as discussion of hospice benefits.  His children feel like his wife will really benefit from the bereavement counseling and support that will be offered.    Pt is noted to have severe tenderness of his buttocks.  He likes to sit up in his chair for much of the day, but then becomes uncomfortable.  In AL, he was lying down for an hour before lunch.  His biggest comfort concern today was his "bottom pain".    Past Medical History:  Diagnosis Date  . Allergic rhinitis   .  Atrial fibrillation ([facility])   . CIDP (chronic inflammatory demyelinating polyneuropathy) ([facility])   . Colonic polyp   . H/O: CVA (cerebrovascular accident)   . Hyperbilirubinemia   . Hyperlipidemia   . Long term (current) use of anticoagulants   . Mitral regurgitation   . Nephrolithiasis   . Nevus, atypical   . Osteopenia   . Recurrent boils   . Unsteady gait    Past Surgical History:  Procedure Laterality Date  . APPENDECTOMY      reports that he quit smoking about 43 years ago. he has never used smokeless tobacco. He reports that he does not drink alcohol or use drugs. Social History   Socioeconomic History  . Marital status: Married    Spouse name: Not on file  . Number of children: 2  . Years of education: Not on file  . Highest education level: Not on file  Social Needs  . Financial resource strain: Not on file  . Food insecurity - worry: Not on file  . Food insecurity - inability: Not on file  . Transportation needs - medical: Not on file  . Transportation needs - non-medical: Not on file  Occupational History  . Occupation: Engineer, site - retired  Tobacco Use  . Smoking status: Former Smoker    Last attempt to quit: 10/06/1973    Years since quitting: 43.6  . Smokeless tobacco: Never Used  Substance and Sexual Activity  . Alcohol use: No    Comment: usually have a couple of drinks a day  . Drug use: No  . Sexual activity: Not on file  Other Topics Concern  . Not on file  Social History Narrative   Patient is Married([name]). Retired. Lives in  single level home, Independent Living  section at [facility] since 2009.   Former smoker, minimal Alcohol history   Patient has Advanced planning documents: Living Will             Functional Status Survey:    Family History  Problem Relation Age of Onset  . Colon cancer Neg Hx     Health Maintenance  Topic Date Due  . PNA vac Low Risk Adult (2 of 2 - PCV13) 03/12/2014  . INFLUENZA VACCINE   10/10/2016  . TETANUS/TDAP  02/19/2025    Allergies  Allergen Reactions  . Penicillins Other (See Comments)    Has patient had a PCN reaction causing immediate rash, facial/tongue/throat swelling, SOB or lightheadedness with hypotension: no Has patient had a PCN reaction causing severe rash involving mucus membranes or skin necrosis: no Has patient had a PCN reaction that required hospitalization no Has patient had a PCN reaction occurring within the last 10 years: no If all of the above answers are "NO", then may proceed with Cephalosporin use.     Outpatient Encounter Medications as of 05/14/2017  Medication  Sig  . acetaminophen (TYLENOL) 325 MG tablet Take 650 mg by mouth every 6 (six) hours as needed for mild pain.   Marland Kitchen aspirin 81 MG chewable tablet Chew 81 mg by mouth every morning.  . cetirizine (ZYRTEC) 10 MG tablet Take 10 mg by mouth daily as needed for allergies.   . food thickener (THICK IT) POWD NECTAR THICK  . guaiFENesin (MUCINEX) 600 MG 12 hr tablet Take 1 tablet (600 mg total) by mouth 2 (two) times daily.  Marland Kitchen ipratropium-albuterol (DUONEB) 0.5-2.5 (3) MG/3ML SOLN Take 3 mLs by nebulization every 6 (six) hours as needed.  . lidocaine (LMX) 4 % cream Apply 1 application topically daily as needed (pain).  . Melatonin 5 MG TABS Take 1 tablet by mouth at bedtime as needed (insomnia).  . metoprolol succinate (TOPROL-XL) 100 MG 24 hr tablet Take 100 mg by mouth every morning. Take with or immediately following a meal.  . morphine (ROXANOL) 20 MG/ML concentrated solution Take 5 mg by mouth every 6 (six) hours as needed for severe pain (dyspnea).  . Multiple Vitamins-Minerals (PRESERVISION AREDS 2 PO) Take 1 capsule by mouth 2 (two) times daily.  Marland Kitchen nystatin (MYCOSTATIN/NYSTOP) powder Apply topically 2 (two) times daily.  . ondansetron (ZOFRAN) 4 MG tablet Take 1 tablet (4 mg total) by mouth every 6 (six) hours as needed for nausea.  . polyvinyl alcohol (LIQUIFILM TEARS) 1.4 %  ophthalmic solution Place 1 drop into both eyes daily as needed for dry eyes.  . traZODone (DESYREL) 50 MG tablet Take 0.5 tablets (25 mg total) by mouth at bedtime.  . [DISCONTINUED] diphenhydramine-acetaminophen (TYLENOL PM) 25-500 MG TABS tablet Take 1 tablet by mouth at bedtime as needed (sleep).   . [DISCONTINUED] ketoconazole (NIZORAL) 2 % cream Apply 1 application topically as needed for irritation.  . [DISCONTINUED] lactose free nutrition (BOOST PLUS) LIQD Take 237 mLs by mouth 2 (two) times daily between meals.   No facility-administered encounter medications on file as of 05/14/2017.     Review of Systems  Constitutional: Positive for activity change, appetite change, fatigue and unexpected weight change. Negative for chills and fever.  HENT: Positive for trouble swallowing. Negative for congestion.   Respiratory: Positive for cough. Negative for chest tightness, shortness of breath and wheezing.   Cardiovascular: Negative for chest pain and leg swelling.  Gastrointestinal: Negative for constipation.  Genitourinary: Negative for dysuria.  Musculoskeletal: Positive for gait problem.  Skin: Positive for pallor. Negative for color change.       Redness of scrotum and buttocks per nursing   Neurological: Positive for weakness and numbness. Negative for dizziness.  Psychiatric/Behavioral: Positive for confusion. Negative for agitation and sleep disturbance. The patient is not nervous/anxious.     Vitals:   05/14/17 1338  BP: 110/70  Pulse: 60  Resp: 19  Temp: 97.9 F (36.6 C)  TempSrc: Oral  SpO2: 95%   There is no height or weight on file to calculate BMI. Physical Exam  Constitutional:  Cachectic, frail white male resting in recliner chair reading the paper  HENT:  Head: Normocephalic and atraumatic.  Right Ear: External ear normal.  Left Ear: External ear normal.  Nose: Nose normal.  Mouth/Throat: Oropharynx is clear and moist. No oropharyngeal exudate.  Eyes:  Conjunctivae and EOM are normal. Pupils are equal, round, and reactive to light.  glasses  Neck: Neck supple. No JVD present.  Cardiovascular: Normal rate, regular rhythm, normal heart sounds and intact distal pulses.  Pulmonary/Chest: Effort normal.  Few coarse rhonchi remain  Abdominal: Soft. Bowel sounds are normal. He exhibits no distension. There is no tenderness.  Genitourinary:  Genitourinary Comments: Scrotum with fire red erythema, tender during examination  Musculoskeletal: Normal range of motion.  Atrophy of muscle of lower extremities  Lymphadenopathy:    He has no cervical adenopathy.  Neurological: He is alert.  Oriented to person and place, not time  Skin: There is erythema.  Erythema of buttocks and gluteal crease, but no open areas present; dressing was removed from his buttock and will be replaced   Psychiatric: He has a normal mood and affect.    Labs reviewed: Basic Metabolic Panel: Recent Labs    05/01/17 0052  05/03/17 0943 05/05/17 0852 05/07/17 1020 05/08/17 0629  NA  --    < > 132* 136 138  --   K  --    < > 4.2 4.0 4.3  --   CL  --    < > 105 105 107  --   CO2  --    < > 18* 23 22  --   GLUCOSE  --    < > 152* 119* 105*  --   BUN  --    < > '19 16 13  ' --   CREATININE 0.81   < > 0.77 0.75 0.71 0.84  CALCIUM  --    < > 9.0 9.2 9.4  --   MG 1.8  --   --   --   --   --    < > = values in this interval not displayed.   Liver Function Tests: Recent Labs    04/30/17 2247 05/01/17 0258  AST 50* 58*  ALT 20 23  ALKPHOS 154* 134*  BILITOT 2.3* 2.0*  PROT 7.5 6.2*  ALBUMIN 4.1 3.4*   No results for input(s): LIPASE, AMYLASE in the last 8760 hours. No results for input(s): AMMONIA in the last 8760 hours. CBC: Recent Labs    04/30/17 2247  05/01/17 0258 05/03/17 0943 05/05/17 0852 05/07/17 1020  WBC 14.9*   < > 13.0* 14.0* 12.3* 14.5*  NEUTROABS 11.1*  --  9.3*  --   --   --   HGB 15.0   < > 13.1 13.1 13.5 13.5  HCT 43.8   < > 39.9 39.9  41.4 41.8  MCV 94.6   < > 94.1 94.5 94.5 95.0  PLT 308   < > 288 263 314 349   < > = values in this interval not displayed.   Cardiac Enzymes: Recent Labs    05/01/17 0052  CKTOTAL 186  TROPONINI 0.03*   BNP: Invalid input(s): POCBNP No results found for: HGBA1C Lab Results  Component Value Date   TSH 1.956 05/01/2017   No results found for: VITAMINB12 No results found for: FOLATE No results found for: IRON, TIBC, FERRITIN  Imaging and Procedures obtained prior to SNF admission: Dg Chest 2 View  Result Date: 04/30/2017 CLINICAL DATA:  Slurred speech EXAM: CHEST  2 VIEW COMPARISON:  04/08/2016 FINDINGS: Small right-sided pleural effusion with right basilar and middle lobe atelectasis or pneumonia. Streaky atelectasis or scarring at the left base. Cardiomegaly with vascular congestion. Mild diffuse interstitial prominence suggests a mild component of edema. Aortic atherosclerosis. No pneumothorax. IMPRESSION: 1. Small right-sided pleural effusion with right middle lobe and basilar atelectasis or pneumonia 2. Cardiomegaly with vascular congestion and mild diffuse interstitial prominence suggesting edema Electronically Signed   By: Madie Reno.D.  On: 04/30/2017 23:49   Ct Head Code Stroke Wo Contrast  Result Date: 04/30/2017 CLINICAL DATA:  Code stroke. RIGHT facial droop, last seen normal at 2000 hours. EXAM: CT HEAD WITHOUT CONTRAST TECHNIQUE: Contiguous axial images were obtained from the base of the skull through the vertex without intravenous contrast. COMPARISON:  CT HEAD February 20, 2015 FINDINGS: BRAIN: No intraparenchymal hemorrhage, mass effect nor midline shift. RIGHT mesial occipital lobe encephalomalacia. Mild ex vacuo dilatation RIGHT frontal horn of lateral ventricle. Moderate to severe parenchymal brain volume loss. No hydrocephalus. Patchy supratentorial white matter hypodensities unchanged. No abnormal extra-axial fluid collections. VASCULAR: Moderate calcific  atherosclerosis of the carotid siphons. SKULL: No skull fracture. Severe temporomandibular osteoarthrosis. No significant scalp soft tissue swelling. SINUSES/ORBITS: Trace paranasal sinus mucosal thickening without air-fluid levels. Mastoid air cells are well aerated. Soft tissue within LEFT external auditory canal compatible with cerumen. Included ocular globes and orbital contents are non-suspicious. Status post bilateral ocular lens implants. OTHER: None. ASPECTS [facility] Stroke Program Early CT Score) - Ganglionic level infarction (caudate, lentiform nuclei, internal capsule, insula, M1-M3 cortex): 7 - Supraganglionic infarction (M4-M6 cortex): 3 Total score (0-10 with 10 being normal): 10 IMPRESSION: 1. No acute intracranial process. 2. ASPECTS is 10. 3. 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 Critical Value/emergent results text paged to [place], Neurology via AMION secure system on 04/30/2017 at 11:05 pm, including interpreting physician's phone number. Electronically Signed   By: Elon Alas M.D.   On: 04/30/2017 23:06    Assessment/Plan 1. Sepsis, due to unspecified organism [facility]) -seems this has resolved, pt completing last few days vantin therapy  2. Chronic diastolic CHF (congestive heart failure) (HCC) -stabilized, no rales now on exam, but does remain hypoxic and require O2 related to this and his pneumonia, dypsphagia (likely with some chronic silent aspiration)  3. CIDP (chronic inflammatory demyelinating polyneuropathy) (HCC) -longstanding, has been very weak and requiring increased help--they were living in AL the past few months  4. Dysphagia, unspecified type -ongoing, getting regular diet at his and family request due to comfort goals  5. Aspiration pneumonia of right middle lobe due to gastric secretions (HCC) -seems this is resolved, but likely to recur often given his dysphagia and desires to  eat regular food and drink for qol  6. Herpes zoster without complication -all dried up on left thigh/buttocks--does not seem to be cause of his pain at this point--seems more localized to sacrum/coccyx/gluteal crease and scrotum now  7. Pressure injury of buttock, stage 1, unspecified laterality -has erythema of sacrum/buttocks/gluteal crease so will keep clean and dry and change hydrocolloid over coccyx every 72 hrs and prn soiling to protect area -roxanol added for pain just 20m every 6 hr prn pain or dyspnea  8. Candidal skin infection -keep as clean and dry as possible, d/c ketoconazole and start nystatin powder for scrotal and gluteal erythema/yeast until resolved  9. ACP (advance care planning)   Last ACP Note 02/22/2017 to 05/23/2017    ACP (Advance Care Planning) by RGayland Curry DO at 05/14/2017 10:34 AM    Date of Service   Author Author Type Status Note Type File Time  05/14/2017 ETawanna Cooler DO Physician Addendum ACP (Advance Care Planning) 05/23/2017        When seen, I met with pt, his wife, HBonnita Malone and his two children, Rodney Malone and RLouie Malone  Palliative care had already met with him and recommended hospice yesterday.  Pt  has been losing weight and eating poorly for several months.  He reports simply not having an appetite.  He remains very weak and cachectic.  He's dependent in adls.  His cognition has been declining and his wife also has memory loss.  They had moved to assisted living fairly recently from [place].  We had an ACP discussion including this information, as well as discussion of hospice benefits.  His children feel like his wife will really benefit from the bereavement counseling and support that will be offered.  Focus for him is comfort and this is his goal--right now to make his bottom stop hurting.  See med mgt note.    Pt has living will, hcpoa, DNR and MOST on file completed with palliative care, [name].  These are on file in documents.    He, his wife  and his two children are agreeable to a hospice care referral to further assist with his comfort and to support the family.    28 mins were spent on ACP.       Family/ staff Communication: met with pt, wife, children, also discussed with SNF nurse and coordinated care, reviewed hospital records, labs, studies and notes and orders here thus far  Labs/tests ordered: d/c further labs; hospice referral formally placed  Pricsilla Lindvall L. Davarious Tumbleson, D.O. [facility] [address], [place] [address] Cell Phone (Mon-Fri 8am-5pm):  [phone] On Call:  [phone] & follow prompts after 5pm & weekends Office Phone:  [phone] Office Fax:  [phone]

## 2017-05-15 DIAGNOSIS — R54 Age-related physical debility: Secondary | ICD-10-CM | POA: Diagnosis not present

## 2017-05-15 DIAGNOSIS — I70209 Unspecified atherosclerosis of native arteries of extremities, unspecified extremity: Secondary | ICD-10-CM | POA: Diagnosis not present

## 2017-05-15 DIAGNOSIS — F0151 Vascular dementia with behavioral disturbance: Secondary | ICD-10-CM | POA: Diagnosis not present

## 2017-05-15 DIAGNOSIS — I340 Nonrheumatic mitral (valve) insufficiency: Secondary | ICD-10-CM | POA: Diagnosis not present

## 2017-05-15 DIAGNOSIS — I270 Primary pulmonary hypertension: Secondary | ICD-10-CM | POA: Diagnosis not present

## 2017-05-15 DIAGNOSIS — E7849 Other hyperlipidemia: Secondary | ICD-10-CM | POA: Diagnosis not present

## 2017-05-15 DIAGNOSIS — I509 Heart failure, unspecified: Secondary | ICD-10-CM | POA: Diagnosis not present

## 2017-05-15 DIAGNOSIS — J309 Allergic rhinitis, unspecified: Secondary | ICD-10-CM | POA: Diagnosis not present

## 2017-05-15 DIAGNOSIS — I679 Cerebrovascular disease, unspecified: Secondary | ICD-10-CM | POA: Diagnosis not present

## 2017-05-15 DIAGNOSIS — H11149 Conjunctival xerosis, unspecified, unspecified eye: Secondary | ICD-10-CM | POA: Diagnosis not present

## 2017-05-15 DIAGNOSIS — I25810 Atherosclerosis of coronary artery bypass graft(s) without angina pectoris: Secondary | ICD-10-CM | POA: Diagnosis not present

## 2017-05-15 DIAGNOSIS — R1310 Dysphagia, unspecified: Secondary | ICD-10-CM | POA: Diagnosis not present

## 2017-05-15 DIAGNOSIS — I4891 Unspecified atrial fibrillation: Secondary | ICD-10-CM | POA: Diagnosis not present

## 2017-05-15 DIAGNOSIS — I519 Heart disease, unspecified: Secondary | ICD-10-CM | POA: Diagnosis not present

## 2017-05-15 DIAGNOSIS — G6181 Chronic inflammatory demyelinating polyneuritis: Secondary | ICD-10-CM | POA: Diagnosis not present

## 2017-05-16 DIAGNOSIS — I509 Heart failure, unspecified: Secondary | ICD-10-CM | POA: Diagnosis not present

## 2017-05-16 DIAGNOSIS — I25810 Atherosclerosis of coronary artery bypass graft(s) without angina pectoris: Secondary | ICD-10-CM | POA: Diagnosis not present

## 2017-05-16 DIAGNOSIS — I4891 Unspecified atrial fibrillation: Secondary | ICD-10-CM | POA: Diagnosis not present

## 2017-05-16 DIAGNOSIS — I70209 Unspecified atherosclerosis of native arteries of extremities, unspecified extremity: Secondary | ICD-10-CM | POA: Diagnosis not present

## 2017-05-16 DIAGNOSIS — I519 Heart disease, unspecified: Secondary | ICD-10-CM | POA: Diagnosis not present

## 2017-05-16 DIAGNOSIS — I340 Nonrheumatic mitral (valve) insufficiency: Secondary | ICD-10-CM | POA: Diagnosis not present

## 2017-05-17 DIAGNOSIS — I519 Heart disease, unspecified: Secondary | ICD-10-CM | POA: Diagnosis not present

## 2017-05-17 DIAGNOSIS — I509 Heart failure, unspecified: Secondary | ICD-10-CM | POA: Diagnosis not present

## 2017-05-17 DIAGNOSIS — I4891 Unspecified atrial fibrillation: Secondary | ICD-10-CM | POA: Diagnosis not present

## 2017-05-17 DIAGNOSIS — I340 Nonrheumatic mitral (valve) insufficiency: Secondary | ICD-10-CM | POA: Diagnosis not present

## 2017-05-17 DIAGNOSIS — I25810 Atherosclerosis of coronary artery bypass graft(s) without angina pectoris: Secondary | ICD-10-CM | POA: Diagnosis not present

## 2017-05-17 DIAGNOSIS — I70209 Unspecified atherosclerosis of native arteries of extremities, unspecified extremity: Secondary | ICD-10-CM | POA: Diagnosis not present

## 2017-05-18 DIAGNOSIS — I340 Nonrheumatic mitral (valve) insufficiency: Secondary | ICD-10-CM | POA: Diagnosis not present

## 2017-05-18 DIAGNOSIS — I70209 Unspecified atherosclerosis of native arteries of extremities, unspecified extremity: Secondary | ICD-10-CM | POA: Diagnosis not present

## 2017-05-18 DIAGNOSIS — I509 Heart failure, unspecified: Secondary | ICD-10-CM | POA: Diagnosis not present

## 2017-05-18 DIAGNOSIS — I4891 Unspecified atrial fibrillation: Secondary | ICD-10-CM | POA: Diagnosis not present

## 2017-05-18 DIAGNOSIS — I25810 Atherosclerosis of coronary artery bypass graft(s) without angina pectoris: Secondary | ICD-10-CM | POA: Diagnosis not present

## 2017-05-18 DIAGNOSIS — I519 Heart disease, unspecified: Secondary | ICD-10-CM | POA: Diagnosis not present

## 2017-05-20 DIAGNOSIS — I519 Heart disease, unspecified: Secondary | ICD-10-CM | POA: Diagnosis not present

## 2017-05-20 DIAGNOSIS — I25810 Atherosclerosis of coronary artery bypass graft(s) without angina pectoris: Secondary | ICD-10-CM | POA: Diagnosis not present

## 2017-05-20 DIAGNOSIS — I4891 Unspecified atrial fibrillation: Secondary | ICD-10-CM | POA: Diagnosis not present

## 2017-05-20 DIAGNOSIS — I70209 Unspecified atherosclerosis of native arteries of extremities, unspecified extremity: Secondary | ICD-10-CM | POA: Diagnosis not present

## 2017-05-20 DIAGNOSIS — I340 Nonrheumatic mitral (valve) insufficiency: Secondary | ICD-10-CM | POA: Diagnosis not present

## 2017-05-20 DIAGNOSIS — I509 Heart failure, unspecified: Secondary | ICD-10-CM | POA: Diagnosis not present

## 2017-05-21 DIAGNOSIS — I70209 Unspecified atherosclerosis of native arteries of extremities, unspecified extremity: Secondary | ICD-10-CM | POA: Diagnosis not present

## 2017-05-21 DIAGNOSIS — I519 Heart disease, unspecified: Secondary | ICD-10-CM | POA: Diagnosis not present

## 2017-05-21 DIAGNOSIS — I25810 Atherosclerosis of coronary artery bypass graft(s) without angina pectoris: Secondary | ICD-10-CM | POA: Diagnosis not present

## 2017-05-21 DIAGNOSIS — I509 Heart failure, unspecified: Secondary | ICD-10-CM | POA: Diagnosis not present

## 2017-05-21 DIAGNOSIS — I340 Nonrheumatic mitral (valve) insufficiency: Secondary | ICD-10-CM | POA: Diagnosis not present

## 2017-05-21 DIAGNOSIS — I4891 Unspecified atrial fibrillation: Secondary | ICD-10-CM | POA: Diagnosis not present

## 2017-05-22 DIAGNOSIS — I509 Heart failure, unspecified: Secondary | ICD-10-CM | POA: Diagnosis not present

## 2017-05-22 DIAGNOSIS — I4891 Unspecified atrial fibrillation: Secondary | ICD-10-CM | POA: Diagnosis not present

## 2017-05-22 DIAGNOSIS — I340 Nonrheumatic mitral (valve) insufficiency: Secondary | ICD-10-CM | POA: Diagnosis not present

## 2017-05-22 DIAGNOSIS — I25810 Atherosclerosis of coronary artery bypass graft(s) without angina pectoris: Secondary | ICD-10-CM | POA: Diagnosis not present

## 2017-05-22 DIAGNOSIS — I519 Heart disease, unspecified: Secondary | ICD-10-CM | POA: Diagnosis not present

## 2017-05-22 DIAGNOSIS — I70209 Unspecified atherosclerosis of native arteries of extremities, unspecified extremity: Secondary | ICD-10-CM | POA: Diagnosis not present

## 2017-05-23 DIAGNOSIS — I5032 Chronic diastolic (congestive) heart failure: Secondary | ICD-10-CM | POA: Insufficient documentation

## 2017-05-23 NOTE — ACP (Advance Care Planning) (Addendum)
When seen, I met with pt, his wife, [name], and his two children, [name] and [name].  Palliative care had already met with him and recommended hospice yesterday.  Pt has been losing weight and eating poorly for several months.  He reports simply not having an appetite.  He remains very weak and cachectic.  He's dependent in adls.  His cognition has been declining and his wife also has memory loss.  They had moved to assisted living fairly recently from [place].  We had an ACP discussion including this information, as well as discussion of hospice benefits.  His children feel like his wife will really benefit from the bereavement counseling and support that will be offered.  Focus for him is comfort and this is his goal--right now to make his bottom stop hurting.  See med mgt note.    Pt has living will, hcpoa, DNR and MOST on file completed with palliative care, [name].  These are on file in documents.    He, his wife and his two children are agreeable to a hospice care referral to further assist with his comfort and to support the family.    28 mins were spent on ACP.

## 2017-05-24 DIAGNOSIS — I519 Heart disease, unspecified: Secondary | ICD-10-CM | POA: Diagnosis not present

## 2017-05-24 DIAGNOSIS — I25810 Atherosclerosis of coronary artery bypass graft(s) without angina pectoris: Secondary | ICD-10-CM | POA: Diagnosis not present

## 2017-05-24 DIAGNOSIS — I70209 Unspecified atherosclerosis of native arteries of extremities, unspecified extremity: Secondary | ICD-10-CM | POA: Diagnosis not present

## 2017-05-24 DIAGNOSIS — I509 Heart failure, unspecified: Secondary | ICD-10-CM | POA: Diagnosis not present

## 2017-05-24 DIAGNOSIS — I340 Nonrheumatic mitral (valve) insufficiency: Secondary | ICD-10-CM | POA: Diagnosis not present

## 2017-05-24 DIAGNOSIS — I4891 Unspecified atrial fibrillation: Secondary | ICD-10-CM | POA: Diagnosis not present

## 2017-05-25 DIAGNOSIS — I25810 Atherosclerosis of coronary artery bypass graft(s) without angina pectoris: Secondary | ICD-10-CM | POA: Diagnosis not present

## 2017-05-25 DIAGNOSIS — I70209 Unspecified atherosclerosis of native arteries of extremities, unspecified extremity: Secondary | ICD-10-CM | POA: Diagnosis not present

## 2017-05-25 DIAGNOSIS — I340 Nonrheumatic mitral (valve) insufficiency: Secondary | ICD-10-CM | POA: Diagnosis not present

## 2017-05-25 DIAGNOSIS — I519 Heart disease, unspecified: Secondary | ICD-10-CM | POA: Diagnosis not present

## 2017-05-25 DIAGNOSIS — I4891 Unspecified atrial fibrillation: Secondary | ICD-10-CM | POA: Diagnosis not present

## 2017-05-25 DIAGNOSIS — I509 Heart failure, unspecified: Secondary | ICD-10-CM | POA: Diagnosis not present

## 2017-05-27 DIAGNOSIS — I4891 Unspecified atrial fibrillation: Secondary | ICD-10-CM | POA: Diagnosis not present

## 2017-05-27 DIAGNOSIS — I509 Heart failure, unspecified: Secondary | ICD-10-CM | POA: Diagnosis not present

## 2017-05-27 DIAGNOSIS — I519 Heart disease, unspecified: Secondary | ICD-10-CM | POA: Diagnosis not present

## 2017-05-27 DIAGNOSIS — I340 Nonrheumatic mitral (valve) insufficiency: Secondary | ICD-10-CM | POA: Diagnosis not present

## 2017-05-27 DIAGNOSIS — I25810 Atherosclerosis of coronary artery bypass graft(s) without angina pectoris: Secondary | ICD-10-CM | POA: Diagnosis not present

## 2017-05-27 DIAGNOSIS — I70209 Unspecified atherosclerosis of native arteries of extremities, unspecified extremity: Secondary | ICD-10-CM | POA: Diagnosis not present

## 2017-05-29 DIAGNOSIS — I519 Heart disease, unspecified: Secondary | ICD-10-CM | POA: Diagnosis not present

## 2017-05-29 DIAGNOSIS — I25810 Atherosclerosis of coronary artery bypass graft(s) without angina pectoris: Secondary | ICD-10-CM | POA: Diagnosis not present

## 2017-05-29 DIAGNOSIS — I4891 Unspecified atrial fibrillation: Secondary | ICD-10-CM | POA: Diagnosis not present

## 2017-05-29 DIAGNOSIS — I70209 Unspecified atherosclerosis of native arteries of extremities, unspecified extremity: Secondary | ICD-10-CM | POA: Diagnosis not present

## 2017-05-29 DIAGNOSIS — I509 Heart failure, unspecified: Secondary | ICD-10-CM | POA: Diagnosis not present

## 2017-05-29 DIAGNOSIS — I340 Nonrheumatic mitral (valve) insufficiency: Secondary | ICD-10-CM | POA: Diagnosis not present

## 2017-05-31 DIAGNOSIS — I25810 Atherosclerosis of coronary artery bypass graft(s) without angina pectoris: Secondary | ICD-10-CM | POA: Diagnosis not present

## 2017-05-31 DIAGNOSIS — I519 Heart disease, unspecified: Secondary | ICD-10-CM | POA: Diagnosis not present

## 2017-05-31 DIAGNOSIS — I70209 Unspecified atherosclerosis of native arteries of extremities, unspecified extremity: Secondary | ICD-10-CM | POA: Diagnosis not present

## 2017-05-31 DIAGNOSIS — I4891 Unspecified atrial fibrillation: Secondary | ICD-10-CM | POA: Diagnosis not present

## 2017-05-31 DIAGNOSIS — I340 Nonrheumatic mitral (valve) insufficiency: Secondary | ICD-10-CM | POA: Diagnosis not present

## 2017-05-31 DIAGNOSIS — I509 Heart failure, unspecified: Secondary | ICD-10-CM | POA: Diagnosis not present

## 2017-06-03 DIAGNOSIS — I519 Heart disease, unspecified: Secondary | ICD-10-CM | POA: Diagnosis not present

## 2017-06-03 DIAGNOSIS — I509 Heart failure, unspecified: Secondary | ICD-10-CM | POA: Diagnosis not present

## 2017-06-03 DIAGNOSIS — I25810 Atherosclerosis of coronary artery bypass graft(s) without angina pectoris: Secondary | ICD-10-CM | POA: Diagnosis not present

## 2017-06-03 DIAGNOSIS — I340 Nonrheumatic mitral (valve) insufficiency: Secondary | ICD-10-CM | POA: Diagnosis not present

## 2017-06-03 DIAGNOSIS — I4891 Unspecified atrial fibrillation: Secondary | ICD-10-CM | POA: Diagnosis not present

## 2017-06-03 DIAGNOSIS — I70209 Unspecified atherosclerosis of native arteries of extremities, unspecified extremity: Secondary | ICD-10-CM | POA: Diagnosis not present

## 2017-06-04 DIAGNOSIS — I70209 Unspecified atherosclerosis of native arteries of extremities, unspecified extremity: Secondary | ICD-10-CM | POA: Diagnosis not present

## 2017-06-04 DIAGNOSIS — I4891 Unspecified atrial fibrillation: Secondary | ICD-10-CM | POA: Diagnosis not present

## 2017-06-04 DIAGNOSIS — I519 Heart disease, unspecified: Secondary | ICD-10-CM | POA: Diagnosis not present

## 2017-06-04 DIAGNOSIS — I509 Heart failure, unspecified: Secondary | ICD-10-CM | POA: Diagnosis not present

## 2017-06-04 DIAGNOSIS — I340 Nonrheumatic mitral (valve) insufficiency: Secondary | ICD-10-CM | POA: Diagnosis not present

## 2017-06-04 DIAGNOSIS — I25810 Atherosclerosis of coronary artery bypass graft(s) without angina pectoris: Secondary | ICD-10-CM | POA: Diagnosis not present

## 2017-06-05 ENCOUNTER — Telehealth: Payer: Self-pay | Admitting: Cardiology

## 2017-06-10 NOTE — Telephone Encounter (Signed)
Returned the call to the daughter to express our sympathy. She has been informed that Dr. Martinique and his nurse Malachy Mood will be made aware. She has been instructed to call the office if there is anything that we can do for her family.

## 2017-06-10 NOTE — Telephone Encounter (Signed)
New Message:    Daughter wanted you to know patient died this morning at 3:30.

## 2017-06-10 DEATH — deceased

## 2017-07-25 ENCOUNTER — Other Ambulatory Visit: Payer: Medicare Other

## 2018-03-27 ENCOUNTER — Ambulatory Visit: Payer: Medicare Other | Admitting: Neurology

## 2018-12-16 IMAGING — CR DG HIP (WITH OR WITHOUT PELVIS) 2-3V*L*
3 series · 3 of 3 positions shown · non-contrast
Comparison: None.

CLINICAL DATA: Pain following fall

EXAM:
DG HIP (WITH OR WITHOUT PELVIS) 2-3V LEFT

[x pelvis]
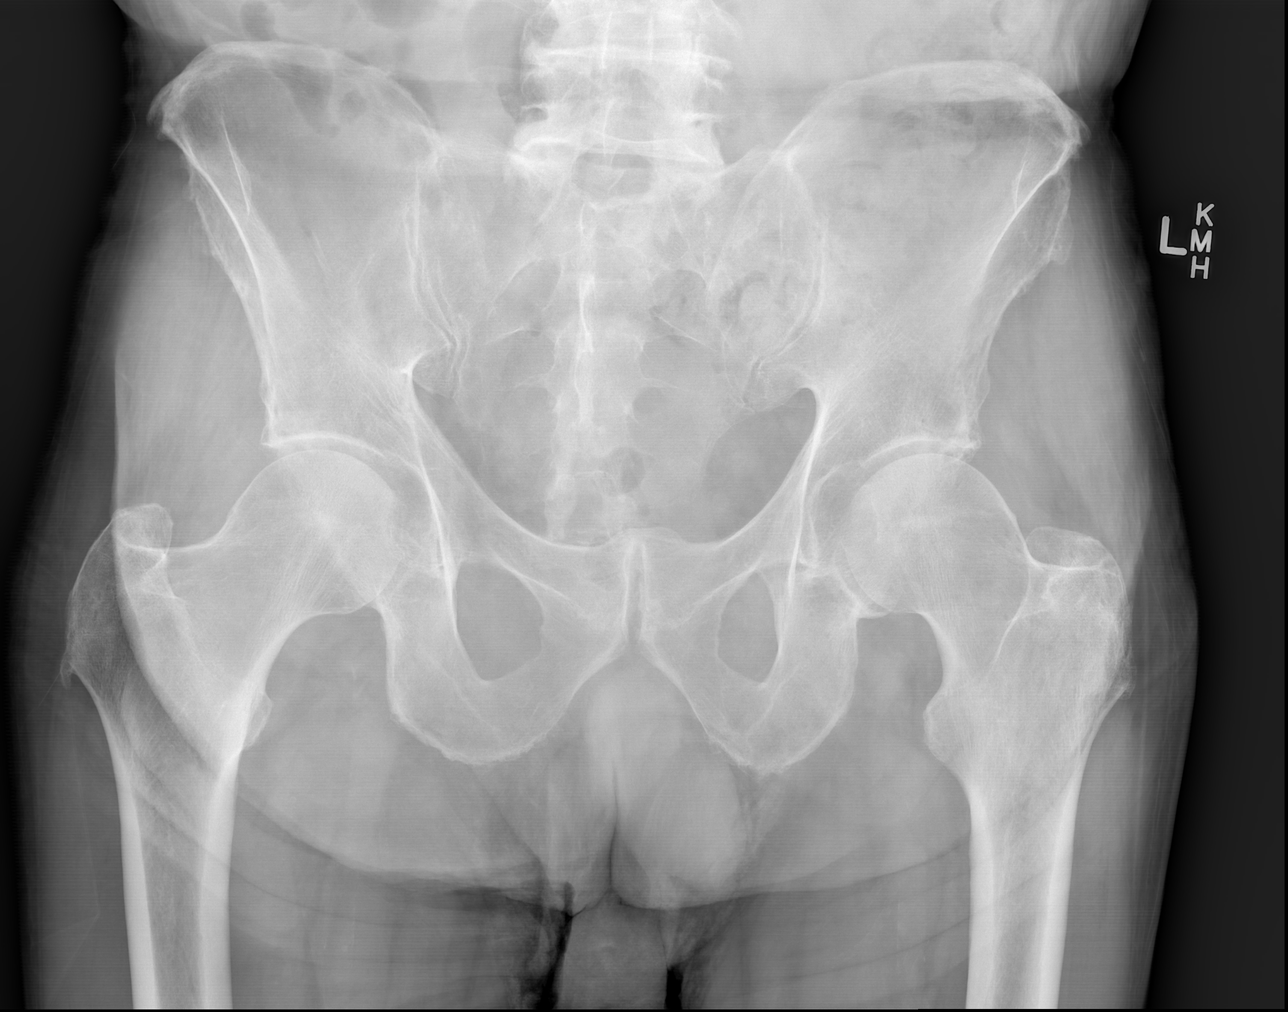

[x hip ap left]
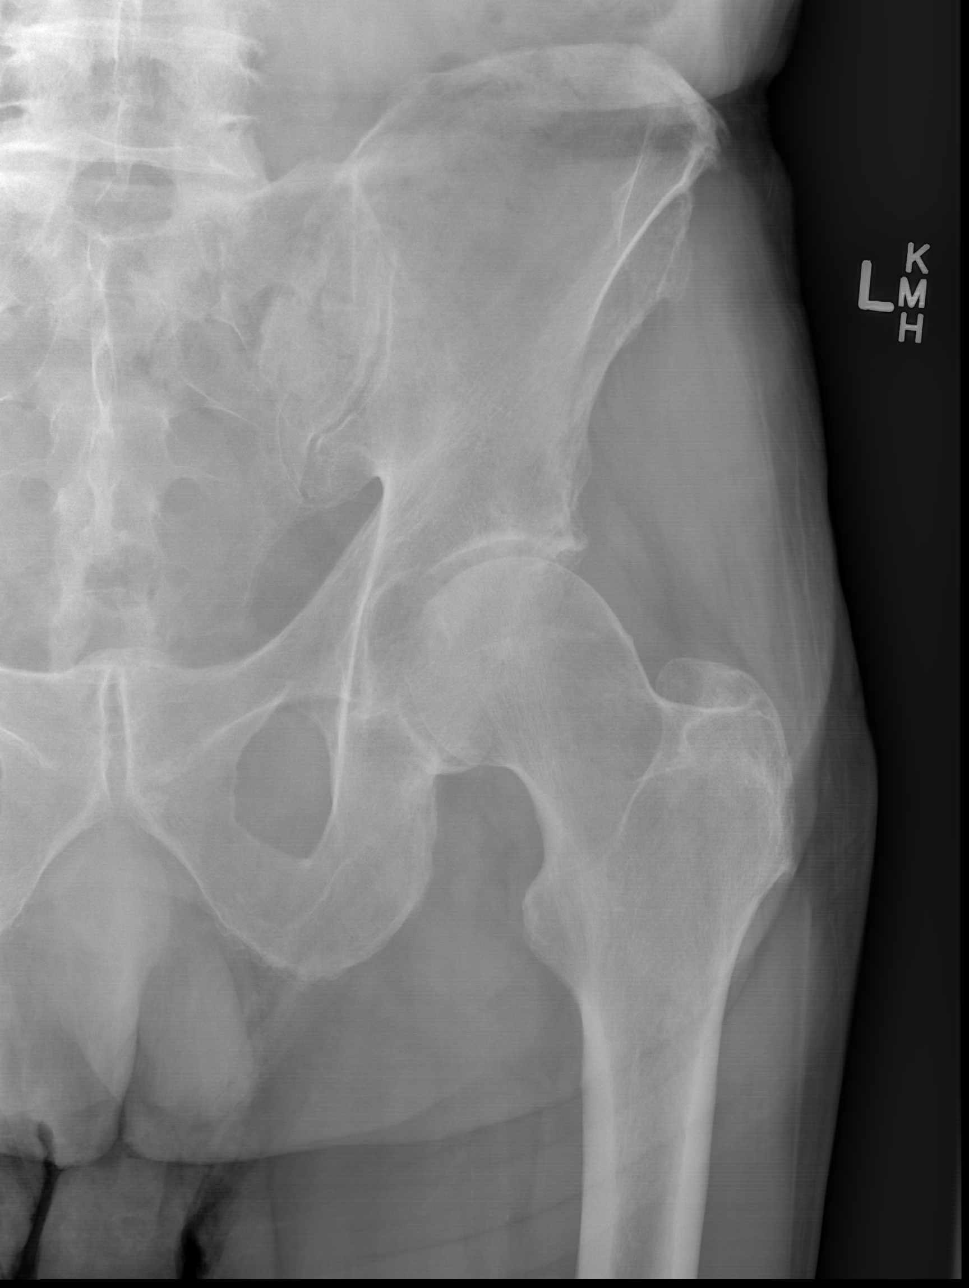

[x hip lat left]
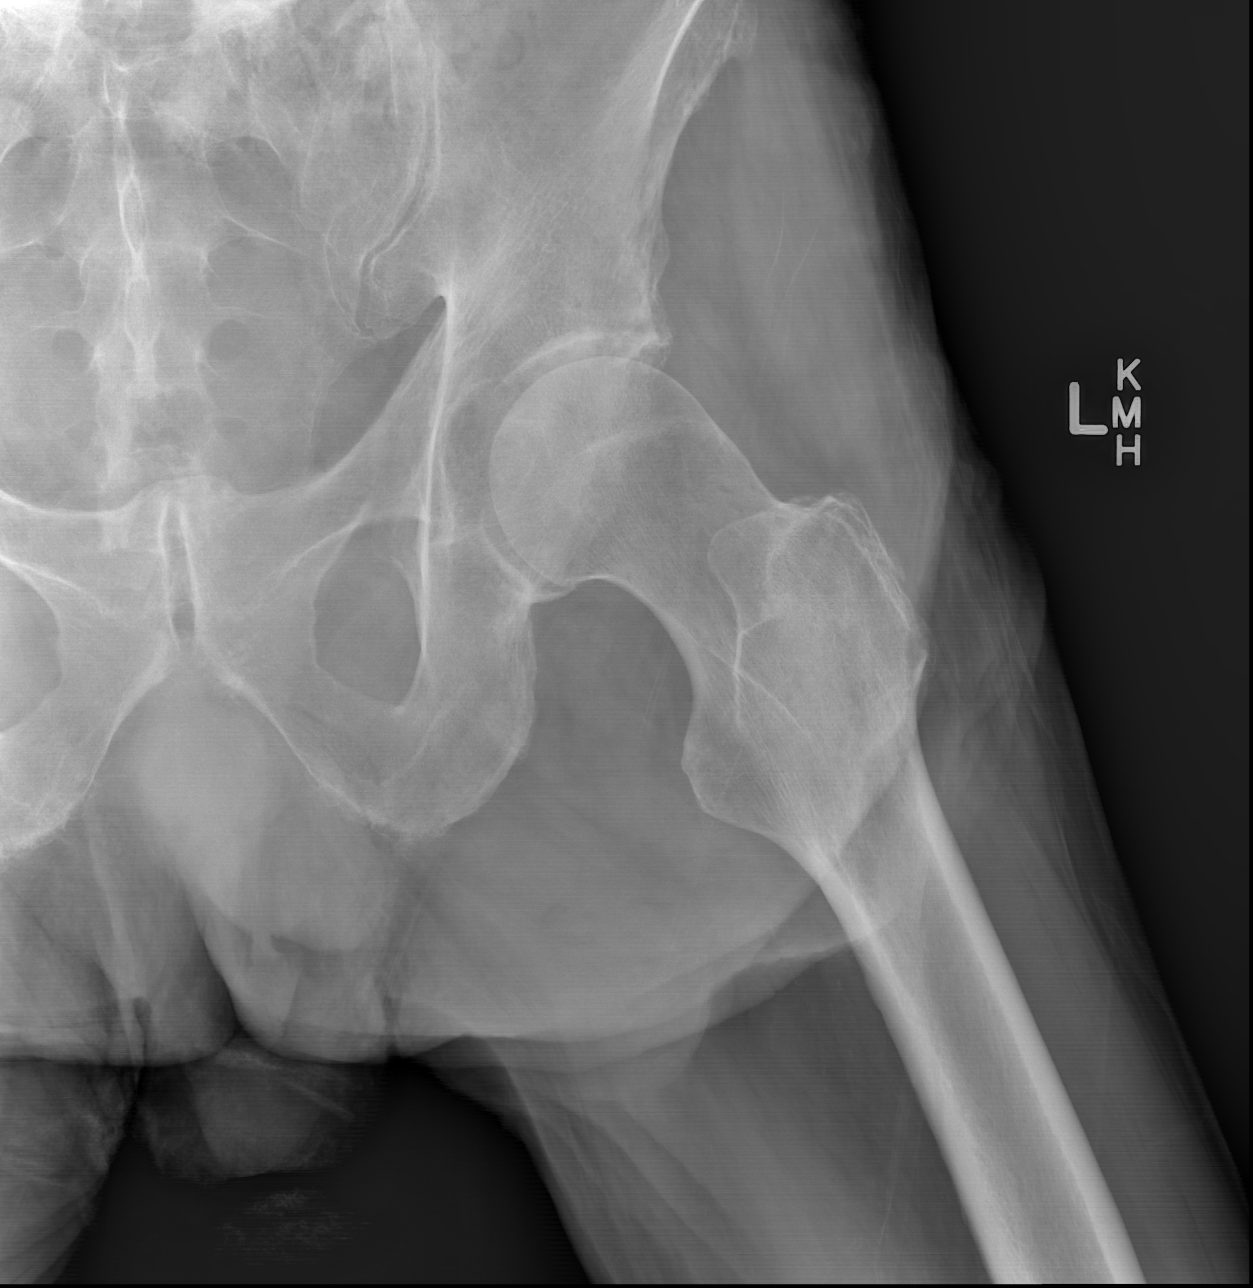

[3 of 3 positions shown; findings below may reference images not displayed]

FINDINGS: Frontal pelvis as well as frontal and lateral left hip images were
obtained. There is mild symmetric narrowing of both hip joints. No
fracture or dislocation. No erosive change. There is evidence of old
trauma involving the pubic symphysis with bony bridging along the
superior aspect of the pubic symphysis.
IMPRESSION: No acute fracture or dislocation. Old trauma involving the superior
aspect of the pubic symphysis. There is mild symmetric narrowing of
both hip joints.

## 2019-01-26 IMAGING — RF DG SWALLOWING FUNCTION
16 series · 24 of 24 positions shown · non-contrast
Comparison: None.

CLINICAL DATA: Dysphagia.

EXAM:
MODIFIED BARIUM SWALLOW
TECHNIQUE: Different consistencies of barium were administered orally to the
patient by the Speech Pathologist. Imaging of the pharynx was
performed in the lateral projection.
FLUOROSCOPY TIME:  Fluoroscopy Time:  2 minutes 24 seconds
Radiation Exposure Index (if provided by the fluoroscopic device):
3.4 mGy

[Series 1: cp_standard · 0.34mm/px · 2 of 67 frames shown (1 of 16)]
[frame 4/67]
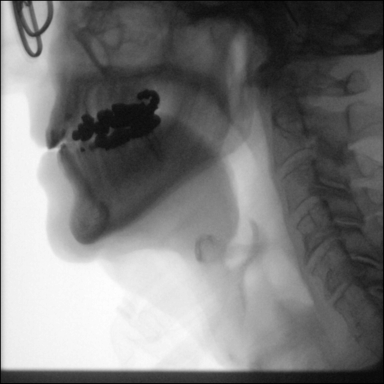
[frame 57/67]
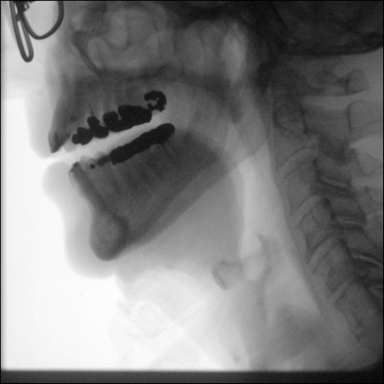

[Series 2: cp_standard · 0.34mm/px · 1 of 137 frames shown (2 of 16)]
[frame 42/137]
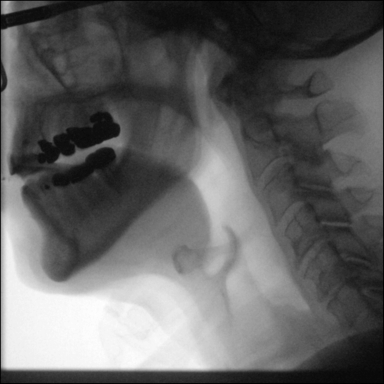

[Series 3: cp_standard · 0.34mm/px · 2 of 127 frames shown (3 of 16)]
[frame 20/127]
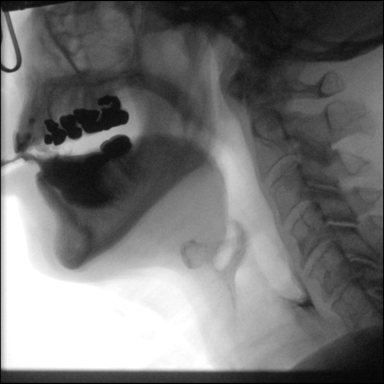
[frame 108/127]
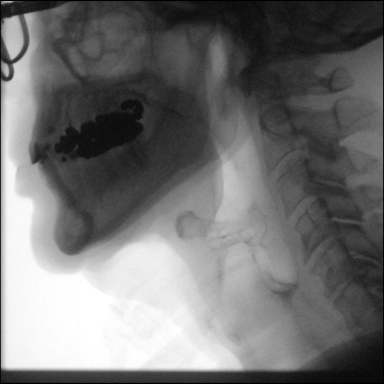

[Series 4: cp_standard · 0.34mm/px · 1 of 260 frames shown (4 of 16)]
[frame 131/260]
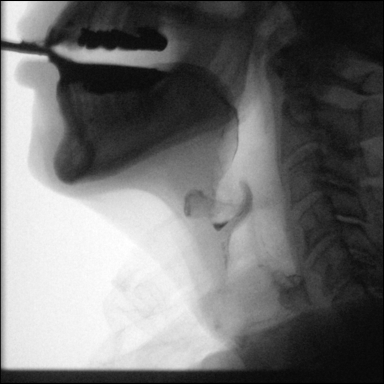

[Series 5: cp_standard · 0.34mm/px · 2 of 46 frames shown (5 of 16)]
[frame 7/46]
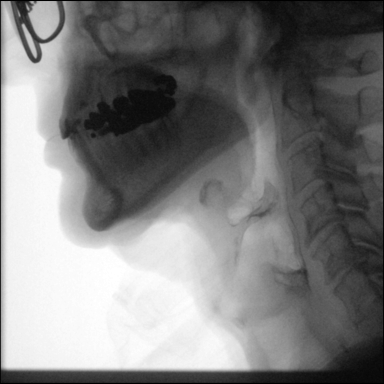
[frame 44/46]
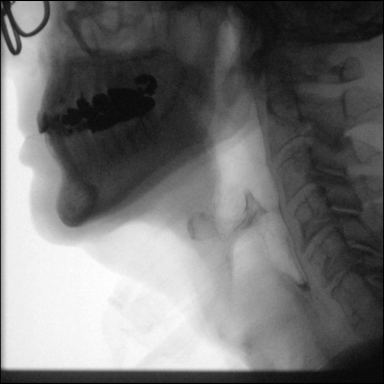

[Series 6: cp_standard · 0.34mm/px · 1 of 124 frames shown (6 of 16)]
[frame 105/124]
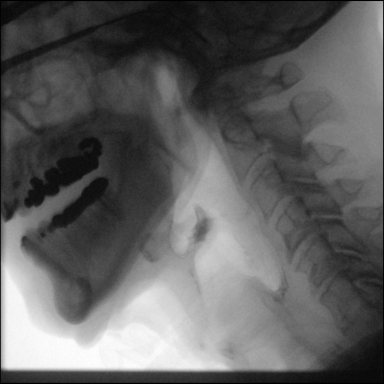

[Series 7: cp_standard · 0.34mm/px · 2 of 89 frames shown (7 of 16)]
[frame 45/89]
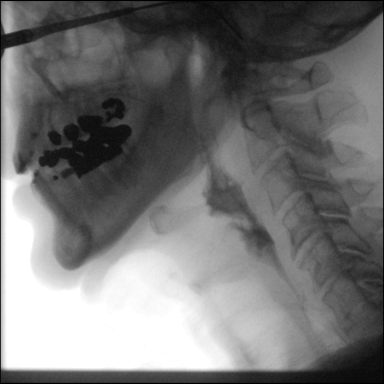
[frame 84/89]
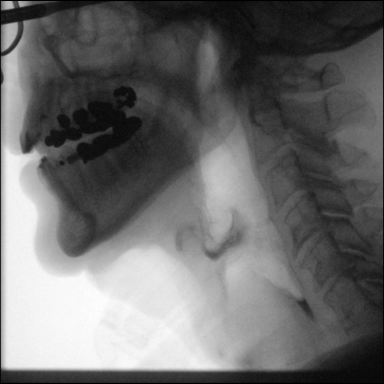

[Series 8: cp_standard · 0.34mm/px · 1 of 34 frames shown (8 of 16)]
[frame 18/34]
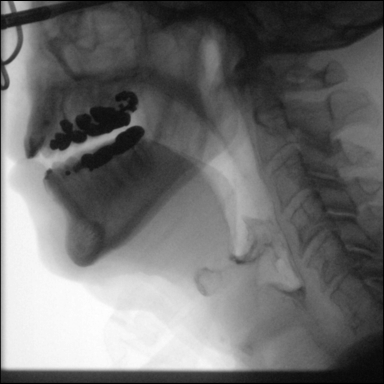

[Series 9: cp_standard · 0.34mm/px · 1 of 30 frames shown (9 of 16)]
[frame 16/30]
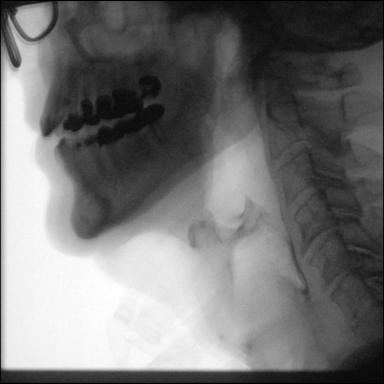

[Series 10: cp_standard · 0.34mm/px · 2 of 116 frames shown (10 of 16)]
[frame 18/116]
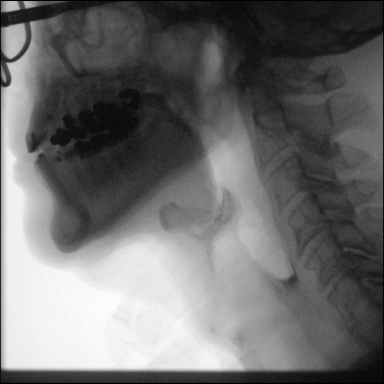
[frame 59/116]
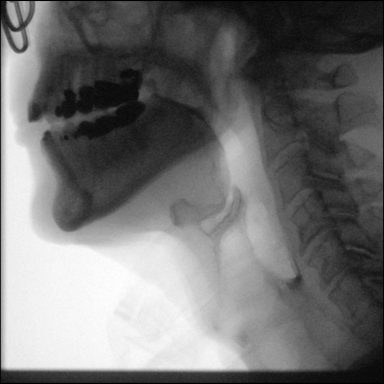

[Series 11: cp_standard · 0.34mm/px · 1 of 32 frames shown (11 of 16)]
[frame 17/32]
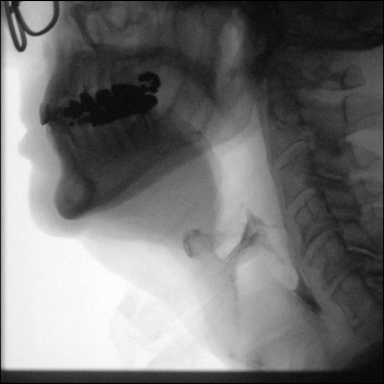

[Series 12: cp_standard · 0.34mm/px · 2 of 211 frames shown (12 of 16)]
[frame 32/211]
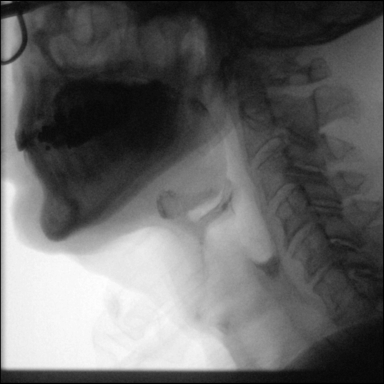
[frame 180/211]
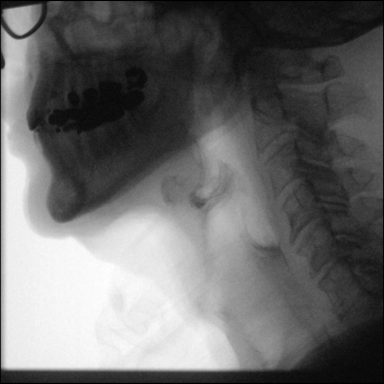

[Series 13: cp_standard · 0.34mm/px · 1 of 138 frames shown (13 of 16)]
[frame 70/138]
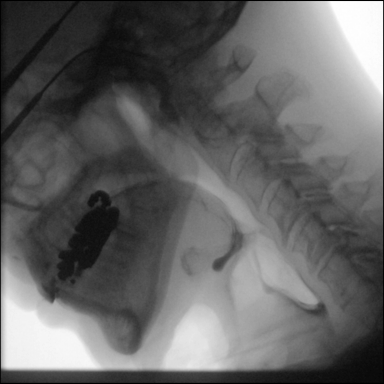

[Series 14: cp_standard · 0.34mm/px · 2 of 136 frames shown (14 of 16)]
[frame 21/136]
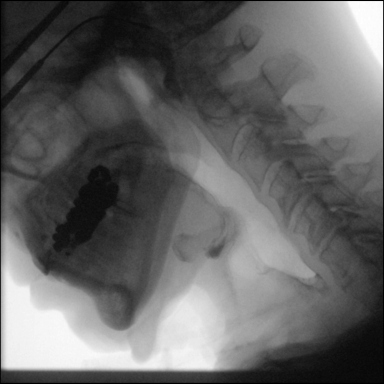
[frame 116/136]
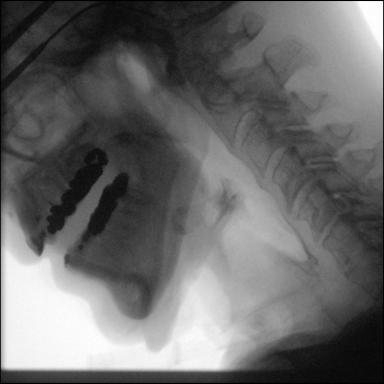

[Series 15: cp_standard · 0.34mm/px · 1 of 129 frames shown (15 of 16)]
[frame 65/129]
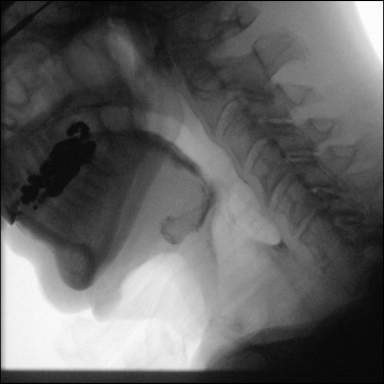

[Series 16: cp_standard · 0.34mm/px · 2 of 275 frames shown (16 of 16)]
[frame 42/275]
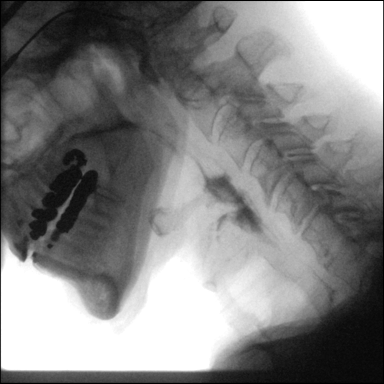
[frame 243/275]
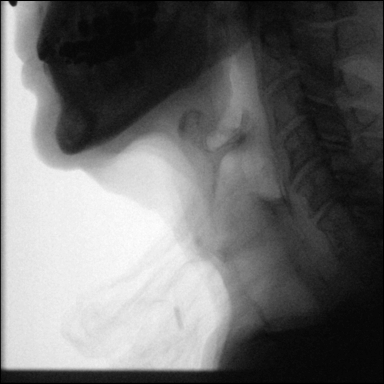

[24 of 24 positions shown; findings below may reference images not displayed]

FINDINGS: Thin liquid- recurrent flash penetration with a single episode of
aspiration. This improved with chin tuck.

Gehert?Eklerd residual in the valleculae and piriform sinuses which cleared
with chin tuck.

Gehert?Aparicio with cracker- residual in the valleculae and piriform sinuses
which cleared with chin tuck.
IMPRESSION: Abnormal swallowing function study with aspiration and laryngeal
penetration with residual in the piriform sinuses and valleculae.

Please refer to the Speech Pathologists report for complete details
and recommendations.
